# Patient Record
Sex: Female | Born: 1945 | Race: White | Hispanic: No | Smoking: Former smoker
Health system: Southern US, Community
[De-identification: ages and names within clinical notes are randomized; demographics above are authoritative.]

## PROBLEM LIST (undated history)

## (undated) DIAGNOSIS — I10 Essential (primary) hypertension: Secondary | ICD-10-CM

## (undated) DIAGNOSIS — K635 Polyp of colon: Secondary | ICD-10-CM

## (undated) DIAGNOSIS — K219 Gastro-esophageal reflux disease without esophagitis: Secondary | ICD-10-CM

## (undated) DIAGNOSIS — E785 Hyperlipidemia, unspecified: Secondary | ICD-10-CM

## (undated) DIAGNOSIS — F430 Acute stress reaction: Secondary | ICD-10-CM

## (undated) DIAGNOSIS — R0600 Dyspnea, unspecified: Secondary | ICD-10-CM

## (undated) DIAGNOSIS — E669 Obesity, unspecified: Secondary | ICD-10-CM

## (undated) DIAGNOSIS — J189 Pneumonia, unspecified organism: Secondary | ICD-10-CM

## (undated) HISTORY — DX: Pneumonia, unspecified organism: J18.9

## (undated) HISTORY — DX: Essential (primary) hypertension: I10

## (undated) HISTORY — DX: Gastro-esophageal reflux disease without esophagitis: K21.9

## (undated) HISTORY — DX: Obesity, unspecified: E66.9

## (undated) HISTORY — DX: Polyp of colon: K63.5

## (undated) HISTORY — DX: Hyperlipidemia, unspecified: E78.5

## (undated) HISTORY — DX: Dyspnea, unspecified: R06.00

## (undated) HISTORY — DX: Acute stress reaction: F43.0

---

## 1973-10-27 HISTORY — PX: OTHER SURGICAL HISTORY: SHX169

## 2006-11-13 ENCOUNTER — Emergency Department: Payer: Self-pay | Admitting: Emergency Medicine

## 2007-02-04 ENCOUNTER — Ambulatory Visit: Payer: Self-pay | Admitting: Family Medicine

## 2007-02-04 LAB — CONVERTED CEMR LAB
ALT: 21 units/L (ref 0–40)
AST: 23 units/L (ref 0–37)
Alkaline Phosphatase: 70 units/L (ref 39–117)
BUN: 11 mg/dL (ref 6–23)
Basophils Relative: 0.5 % (ref 0.0–1.0)
CO2: 31 meq/L (ref 19–32)
Calcium: 9.3 mg/dL (ref 8.4–10.5)
Chloride: 108 meq/L (ref 96–112)
Cholesterol: 237 mg/dL (ref 0–200)
Creatinine, Ser: 0.8 mg/dL (ref 0.4–1.2)
Hemoglobin: 14.6 g/dL (ref 12.0–15.0)
Monocytes Absolute: 0.7 10*3/uL (ref 0.2–0.7)
Monocytes Relative: 8.1 % (ref 3.0–11.0)
Potassium: 4.6 meq/L (ref 3.5–5.1)
RDW: 13.6 % (ref 11.5–14.6)
Total Bilirubin: 0.7 mg/dL (ref 0.3–1.2)
Total CHOL/HDL Ratio: 5.5
Total Protein: 7.3 g/dL (ref 6.0–8.3)
VLDL: 31 mg/dL (ref 0–40)

## 2007-02-10 ENCOUNTER — Ambulatory Visit: Payer: Self-pay | Admitting: Family Medicine

## 2007-02-18 ENCOUNTER — Ambulatory Visit: Payer: Self-pay | Admitting: Gastroenterology

## 2007-03-01 ENCOUNTER — Encounter: Payer: Self-pay | Admitting: Family Medicine

## 2007-03-01 DIAGNOSIS — I10 Essential (primary) hypertension: Secondary | ICD-10-CM

## 2007-03-01 DIAGNOSIS — E785 Hyperlipidemia, unspecified: Secondary | ICD-10-CM | POA: Insufficient documentation

## 2007-03-01 DIAGNOSIS — K5120 Ulcerative (chronic) proctitis without complications: Secondary | ICD-10-CM | POA: Insufficient documentation

## 2007-03-01 DIAGNOSIS — K219 Gastro-esophageal reflux disease without esophagitis: Secondary | ICD-10-CM | POA: Insufficient documentation

## 2007-03-01 DIAGNOSIS — Z87891 Personal history of nicotine dependence: Secondary | ICD-10-CM

## 2007-03-04 ENCOUNTER — Encounter: Payer: Self-pay | Admitting: Family Medicine

## 2007-03-04 ENCOUNTER — Encounter: Payer: Self-pay | Admitting: Gastroenterology

## 2007-03-04 ENCOUNTER — Ambulatory Visit: Payer: Self-pay | Admitting: Gastroenterology

## 2007-03-04 DIAGNOSIS — D126 Benign neoplasm of colon, unspecified: Secondary | ICD-10-CM | POA: Insufficient documentation

## 2007-03-04 DIAGNOSIS — K635 Polyp of colon: Secondary | ICD-10-CM

## 2007-03-04 HISTORY — DX: Polyp of colon: K63.5

## 2007-03-12 ENCOUNTER — Ambulatory Visit: Payer: Self-pay | Admitting: Family Medicine

## 2007-03-12 DIAGNOSIS — R0989 Other specified symptoms and signs involving the circulatory and respiratory systems: Secondary | ICD-10-CM

## 2007-03-12 DIAGNOSIS — R0609 Other forms of dyspnea: Secondary | ICD-10-CM | POA: Insufficient documentation

## 2007-03-16 ENCOUNTER — Ambulatory Visit: Payer: Self-pay | Admitting: Gastroenterology

## 2007-03-25 ENCOUNTER — Ambulatory Visit: Payer: Self-pay | Admitting: Internal Medicine

## 2007-03-29 ENCOUNTER — Ambulatory Visit: Payer: Self-pay | Admitting: Gastroenterology

## 2007-04-12 ENCOUNTER — Ambulatory Visit: Admission: RE | Admit: 2007-04-12 | Discharge: 2007-04-12 | Payer: Self-pay | Admitting: Internal Medicine

## 2007-04-12 ENCOUNTER — Ambulatory Visit: Payer: Self-pay

## 2007-04-12 ENCOUNTER — Encounter: Payer: Self-pay | Admitting: Family Medicine

## 2007-04-14 ENCOUNTER — Encounter (INDEPENDENT_AMBULATORY_CARE_PROVIDER_SITE_OTHER): Payer: Self-pay | Admitting: *Deleted

## 2007-05-05 ENCOUNTER — Ambulatory Visit: Payer: Self-pay | Admitting: Internal Medicine

## 2007-05-07 ENCOUNTER — Ambulatory Visit: Payer: Self-pay

## 2007-05-17 ENCOUNTER — Ambulatory Visit: Payer: Self-pay | Admitting: Internal Medicine

## 2008-01-31 ENCOUNTER — Ambulatory Visit: Payer: Self-pay | Admitting: Family Medicine

## 2008-01-31 DIAGNOSIS — E669 Obesity, unspecified: Secondary | ICD-10-CM | POA: Insufficient documentation

## 2008-02-01 LAB — CONVERTED CEMR LAB
Amylase: 68 units/L (ref 27–131)
Bilirubin, Direct: 0.1 mg/dL (ref 0.0–0.3)
Eosinophils Absolute: 0.2 10*3/uL (ref 0.0–0.7)
H Pylori IgG: NEGATIVE
HCT: 43.4 % (ref 36.0–46.0)
Lipase: 31 units/L (ref 11.0–59.0)
MCHC: 32.9 g/dL (ref 30.0–36.0)
MCV: 93.5 fL (ref 78.0–100.0)
Monocytes Absolute: 0.8 10*3/uL (ref 0.1–1.0)
Neutro Abs: 6.1 10*3/uL (ref 1.4–7.7)
Platelets: 361 10*3/uL (ref 150–400)
RDW: 13.2 % (ref 11.5–14.6)
Total Bilirubin: 0.9 mg/dL (ref 0.3–1.2)
Total Protein: 7.5 g/dL (ref 6.0–8.3)

## 2008-02-02 ENCOUNTER — Encounter: Payer: Self-pay | Admitting: Family Medicine

## 2008-02-16 ENCOUNTER — Ambulatory Visit: Payer: Self-pay | Admitting: Family Medicine

## 2008-02-16 DIAGNOSIS — R4589 Other symptoms and signs involving emotional state: Secondary | ICD-10-CM

## 2008-02-18 ENCOUNTER — Ambulatory Visit: Payer: Self-pay | Admitting: Gastroenterology

## 2008-11-10 ENCOUNTER — Ambulatory Visit: Payer: Self-pay | Admitting: Family Medicine

## 2008-11-10 DIAGNOSIS — R609 Edema, unspecified: Secondary | ICD-10-CM | POA: Insufficient documentation

## 2008-11-24 ENCOUNTER — Ambulatory Visit: Payer: Self-pay | Admitting: Family Medicine

## 2008-12-05 LAB — CONVERTED CEMR LAB
AST: 21 units/L (ref 0–37)
Albumin: 3.7 g/dL (ref 3.5–5.2)
BUN: 23 mg/dL (ref 6–23)
Calcium: 9.2 mg/dL (ref 8.4–10.5)
Cholesterol: 248 mg/dL (ref 0–200)
Creatinine, Ser: 1.1 mg/dL (ref 0.4–1.2)
Eosinophils Absolute: 0.3 10*3/uL (ref 0.0–0.7)
Eosinophils Relative: 2.9 % (ref 0.0–5.0)
Glucose, Bld: 93 mg/dL (ref 70–99)
HCT: 39.3 % (ref 36.0–46.0)
Hemoglobin: 14.1 g/dL (ref 12.0–15.0)
MCV: 88.4 fL (ref 78.0–100.0)
Monocytes Absolute: 0.9 10*3/uL (ref 0.1–1.0)
Monocytes Relative: 9.4 % (ref 3.0–12.0)
Neutro Abs: 5.6 10*3/uL (ref 1.4–7.7)
Phosphorus: 4.3 mg/dL (ref 2.3–4.6)
Platelets: 333 10*3/uL (ref 150–400)
Potassium: 4.3 meq/L (ref 3.5–5.1)
TSH: 3.81 microintl units/mL (ref 0.35–5.50)
Triglycerides: 113 mg/dL (ref 0–149)
WBC: 9.5 10*3/uL (ref 4.5–10.5)

## 2008-12-07 ENCOUNTER — Encounter: Payer: Self-pay | Admitting: Family Medicine

## 2008-12-21 ENCOUNTER — Ambulatory Visit: Payer: Self-pay | Admitting: Family Medicine

## 2009-01-27 ENCOUNTER — Ambulatory Visit: Payer: Self-pay | Admitting: Family Medicine

## 2009-01-27 ENCOUNTER — Telehealth: Payer: Self-pay | Admitting: Family Medicine

## 2009-02-01 ENCOUNTER — Encounter: Payer: Self-pay | Admitting: Family Medicine

## 2009-02-02 ENCOUNTER — Encounter: Payer: Self-pay | Admitting: Family Medicine

## 2009-06-22 ENCOUNTER — Ambulatory Visit: Payer: Self-pay | Admitting: Family Medicine

## 2009-06-25 LAB — CONVERTED CEMR LAB
Albumin: 3.5 g/dL (ref 3.5–5.2)
BUN: 20 mg/dL (ref 6–23)
Calcium: 9 mg/dL (ref 8.4–10.5)
Cholesterol: 168 mg/dL (ref 0–200)
Creatinine, Ser: 1.1 mg/dL (ref 0.4–1.2)
Glucose, Bld: 90 mg/dL (ref 70–99)
HDL: 37.5 mg/dL — ABNORMAL LOW (ref >39.00)
LDL Cholesterol: 100 mg/dL — ABNORMAL HIGH (ref 0–99)
Phosphorus: 3 mg/dL (ref 2.3–4.6)
Triglycerides: 151 mg/dL — ABNORMAL HIGH (ref 0.0–149.0)
VLDL: 30.2 mg/dL (ref 0.0–40.0)

## 2009-07-16 ENCOUNTER — Telehealth: Payer: Self-pay | Admitting: Gastroenterology

## 2009-09-11 ENCOUNTER — Ambulatory Visit: Payer: Self-pay | Admitting: Family Medicine

## 2009-09-19 ENCOUNTER — Telehealth: Payer: Self-pay | Admitting: Gastroenterology

## 2009-09-24 ENCOUNTER — Ambulatory Visit: Payer: Self-pay | Admitting: Family Medicine

## 2009-10-09 ENCOUNTER — Ambulatory Visit: Payer: Self-pay | Admitting: Family Medicine

## 2009-10-18 ENCOUNTER — Observation Stay: Payer: Self-pay | Admitting: Student

## 2009-10-18 ENCOUNTER — Encounter: Payer: Self-pay | Admitting: Family Medicine

## 2009-10-18 ENCOUNTER — Ambulatory Visit: Payer: Self-pay | Admitting: Internal Medicine

## 2009-10-24 ENCOUNTER — Ambulatory Visit: Payer: Self-pay | Admitting: Gastroenterology

## 2009-10-30 ENCOUNTER — Ambulatory Visit: Payer: Self-pay | Admitting: Internal Medicine

## 2009-10-31 ENCOUNTER — Ambulatory Visit: Payer: Self-pay | Admitting: Family Medicine

## 2009-11-01 ENCOUNTER — Ambulatory Visit: Payer: Self-pay | Admitting: Gastroenterology

## 2009-11-01 LAB — HM COLONOSCOPY

## 2009-11-06 ENCOUNTER — Encounter: Payer: Self-pay | Admitting: Gastroenterology

## 2009-11-07 ENCOUNTER — Encounter: Payer: Self-pay | Admitting: Gastroenterology

## 2009-11-09 ENCOUNTER — Encounter (INDEPENDENT_AMBULATORY_CARE_PROVIDER_SITE_OTHER): Payer: Self-pay

## 2009-11-13 ENCOUNTER — Ambulatory Visit: Payer: Self-pay | Admitting: Gastroenterology

## 2009-12-05 ENCOUNTER — Encounter (INDEPENDENT_AMBULATORY_CARE_PROVIDER_SITE_OTHER): Payer: Self-pay | Admitting: *Deleted

## 2009-12-24 ENCOUNTER — Ambulatory Visit: Payer: Self-pay | Admitting: Family Medicine

## 2009-12-26 LAB — CONVERTED CEMR LAB
AST: 19 units/L (ref 0–37)
BUN: 16 mg/dL (ref 6–23)
CO2: 30 meq/L (ref 19–32)
Cholesterol: 178 mg/dL (ref 0–200)
Creatinine, Ser: 0.9 mg/dL (ref 0.4–1.2)
GFR calc non Af Amer: 66.92 mL/min (ref >60)
Glucose, Bld: 91 mg/dL (ref 70–99)
LDL Cholesterol: 100 mg/dL — ABNORMAL HIGH (ref 0–99)
Sodium: 141 meq/L (ref 135–145)
Total CHOL/HDL Ratio: 4
VLDL: 28.8 mg/dL (ref 0.0–40.0)

## 2010-01-01 ENCOUNTER — Telehealth: Payer: Self-pay | Admitting: Gastroenterology

## 2010-01-08 ENCOUNTER — Ambulatory Visit: Payer: Self-pay | Admitting: Gastroenterology

## 2010-01-08 DIAGNOSIS — Z860100 Personal history of colon polyps, unspecified: Secondary | ICD-10-CM | POA: Insufficient documentation

## 2010-01-08 DIAGNOSIS — E538 Deficiency of other specified B group vitamins: Secondary | ICD-10-CM

## 2010-02-12 ENCOUNTER — Ambulatory Visit: Payer: Self-pay | Admitting: Internal Medicine

## 2010-06-27 ENCOUNTER — Ambulatory Visit: Payer: Self-pay | Admitting: Family Medicine

## 2010-07-01 ENCOUNTER — Encounter: Payer: Self-pay | Admitting: Family Medicine

## 2010-07-01 ENCOUNTER — Emergency Department: Payer: Self-pay | Admitting: Emergency Medicine

## 2010-07-02 ENCOUNTER — Ambulatory Visit: Payer: Self-pay | Admitting: Family Medicine

## 2010-07-09 ENCOUNTER — Ambulatory Visit: Payer: Self-pay | Admitting: Family Medicine

## 2010-07-09 DIAGNOSIS — Z8719 Personal history of other diseases of the digestive system: Secondary | ICD-10-CM

## 2010-07-09 DIAGNOSIS — M79605 Pain in left leg: Secondary | ICD-10-CM

## 2010-07-09 DIAGNOSIS — M545 Low back pain: Secondary | ICD-10-CM

## 2010-07-09 DIAGNOSIS — K5732 Diverticulitis of large intestine without perforation or abscess without bleeding: Secondary | ICD-10-CM

## 2010-07-24 ENCOUNTER — Ambulatory Visit: Payer: Self-pay | Admitting: Family Medicine

## 2010-07-25 LAB — CONVERTED CEMR LAB
ALT: 21 units/L (ref 0–35)
ALT: 26 units/L (ref 0–35)
AST: 21 units/L (ref 0–37)
Albumin: 3.9 g/dL (ref 3.5–5.2)
Alkaline Phosphatase: 66 units/L (ref 39–117)
BUN: 16 mg/dL (ref 6–23)
BUN: 22 mg/dL (ref 6–23)
Basophils Relative: 0.6 % (ref 0.0–3.0)
Bilirubin, Direct: 0.1 mg/dL (ref 0.0–0.3)
Bilirubin, Direct: 0.1 mg/dL (ref 0.0–0.3)
CO2: 30 meq/L (ref 19–32)
Calcium: 9.3 mg/dL (ref 8.4–10.5)
Calcium: 9.6 mg/dL (ref 8.4–10.5)
Chloride: 99 meq/L (ref 96–112)
Chloride: 100 meq/L (ref 96–112)
Cholesterol: 184 mg/dL (ref 0–200)
Creatinine, Ser: 1 mg/dL (ref 0.4–1.2)
Eosinophils Relative: 4.3 % (ref 0.0–5.0)
Eosinophils Relative: 3.8 % (ref 0.0–5.0)
GFR calc non Af Amer: 56.53 mL/min (ref >60)
Hemoglobin: 13.6 g/dL (ref 12.0–15.0)
LDL Cholesterol: 111 mg/dL — ABNORMAL HIGH (ref 0–99)
Lipase: 41 units/L (ref 11.0–59.0)
Lymphocytes Relative: 33.1 % (ref 12.0–46.0)
Lymphocytes Relative: 24.5 % (ref 12.0–46.0)
MCV: 92.9 fL (ref 78.0–100.0)
Monocytes Absolute: 0.8 10*3/uL (ref 0.1–1.0)
Neutro Abs: 4.2 10*3/uL (ref 1.4–7.7)
Neutrophils Relative %: 54.3 % (ref 43.0–77.0)
Neutrophils Relative %: 62.4 % (ref 43.0–77.0)
Platelets: 364 10*3/uL (ref 150.0–400.0)
Potassium: 4 meq/L (ref 3.5–5.1)
RBC: 4.37 M/uL (ref 3.87–5.11)
Total Bilirubin: 0.7 mg/dL (ref 0.3–1.2)
Total Protein: 6.9 g/dL (ref 6.0–8.3)
VLDL: 34.6 mg/dL (ref 0.0–40.0)
WBC: 7.7 10*3/uL (ref 4.5–10.5)
WBC: 9.3 10*3/uL (ref 4.5–10.5)

## 2010-08-07 ENCOUNTER — Encounter: Payer: Self-pay | Admitting: Family Medicine

## 2010-08-29 ENCOUNTER — Telehealth: Payer: Self-pay | Admitting: Gastroenterology

## 2010-08-30 ENCOUNTER — Telehealth: Payer: Self-pay | Admitting: Gastroenterology

## 2010-09-06 ENCOUNTER — Telehealth: Payer: Self-pay | Admitting: Gastroenterology

## 2010-11-13 ENCOUNTER — Encounter: Payer: Self-pay | Admitting: Gastroenterology

## 2010-11-26 NOTE — Progress Notes (Signed)
Summary: triage   Phone Note Call from Patient Call back at Home Phone [phone]   Caller: Patient Call For: Courtney Roth Reason for Call: Talk to Nurse Summary of Call: Patient wants to be seen sooner than April 5 (first available) do to rectal bleeding since col done Jan 6 Initial call taken by: Ronalee Red,  January 01, 2010 10:46 AM  Follow-up for Phone Call        Given appt. for 01/08/10. Follow-up by: Abel Presto RN,  January 01, 2010 11:29 AM

## 2010-11-26 NOTE — Procedures (Signed)
Summary: Colonoscopy  Patient: Courtney Roth Note: All result statuses are Final unless otherwise noted.  Tests: (1) Colonoscopy (COL)   COL Colonoscopy           [facility].     [place], [place]  [address]           COLONOSCOPY PROCEDURE REPORT           PATIENT:  Courtney Roth, Courtney Roth  MR#:  [id number]     BIRTHDATE:  [birth year], 63 yrs. old  GENDER:  female           ENDOSCOPIST:  Sandy Salaam. Deatra Ina, MD     Referred by:           PROCEDURE DATE:  11/01/2009     PROCEDURE:  Colonoscopy with polypectomy and submucosal injection,     Colonoscopy with biopsy     ASA CLASS:  Class II     INDICATIONS:  evaluation of ulcerative colitis           MEDICATIONS:   Fentanyl 50 mcg IV, Versed 7 mg IV, Benadryl 25 mg     IV           DESCRIPTION OF PROCEDURE:   After the risks benefits and     alternatives of the procedure were thoroughly explained, informed     consent was obtained.  Digital rectal exam was performed and     revealed no abnormalities.   The LB [id number]-[id number] endoscope     was introduced through the anus and advanced to the cecum, which     was identified by the ileocecal valve, without limitations.  The     quality of the prep was excellent, using MoviPrep.  The instrument     was then slowly withdrawn as the colon was fully examined.     <<PROCEDUREIMAGES>>           FINDINGS:  A sessile polyp was found in the ascending colon. It     was 18 mm in size. 8cc NS injected submucosally (see image5).     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 2cm cuff of edematous and     erythematous mucosa just inside the anal vault. Bxs taken (see     image14, image15, and image16).  Internal hemorrhoids were found     (see image15).  This was otherwise a normal examination of the     colon (see image1, image2, image7, image9, image11, and image12).     Random biopsies were taken  every 10cm to r/o dysplasia     Retroflexed views in the rectum revealed no abnormalities.    The     scope was then withdrawn from the patient and the procedure     completed.           COMPLICATIONS:  None           ENDOSCOPIC IMPRESSION:     1) 18 mm sessile polyp in the ascending colon     2) Proctitis     3) Internal hemorrhoids     4) Otherwise normal examination     RECOMMENDATIONS:     1) Decrease Asacol  800 mg po tid     2) colonoscopy 1 year           REPEAT EXAM:  In 1 year(s) for Colonoscopy.  ______________________________     Sandy Salaam Deatra Ina, MD           CC:  Abner Greenspan, MD           n.     Lorrin MaisSandy Salaam. Kynzlie Hilleary at 11/01/2009 04:10 PM           Page 2 of 3   Courtney Roth, Courtney Roth, [id number]  Note: An exclamation mark (!) indicates a result that was not dispersed into the flowsheet. Document Creation Date: 11/01/2009 4:10 PM _______________________________________________________________________  (1) Order result status: Final Collection or observation date-time: 11/01/2009 15:59 Requested date-time:  Receipt date-time:  Reported date-time:  Referring Physician:   Ordering Physician: Erskine Emery [phone]) Specimen Source:  Source: Tawanna Cooler Order Number: [id number] Lab site:   Appended Document: Colonoscopy     Procedures Next Due Date:    Colonoscopy: 10/2010

## 2010-11-26 NOTE — Progress Notes (Signed)
Summary: Triage  Medications Added LIALDA 1.2 GM TBEC (MESALAMINE) 2.4 gm by mouth once daily PROCTOFOAM 1 % FOAM (PRAMOXINE HCL) use nightly for 14 days       Phone Note Call from Patient Call back at Home Phone [phone]   Caller: Patient Call For: Dr. Deatra Ina Reason for Call: Talk to Nurse Summary of Call: rectal bleeding everytime she has a BM x3weeks Initial call taken by: Webb Laws,  August 29, 2010 9:48 AM  Follow-up for Phone Call        Left message for patient to call back [place], [facility]  August 29, 2010 9:53 AM  Patient  returned my call.  She has 3 weeks of rectal bleeding, mucus in stool.  She has more frequent stools that are semisolid, but not diarrhea. Currently she is on Asacol 400 mg 2 BID.    She went to her pharmacy yesterday and her Asacol 400 were $300.  She has Asacol 800 and Lialda also on her formulary.  Please advise if ok to call the 800 mg or recommend a rx change due to cost.  According to the office note from 12/2009 patient is due for a colon in January, she is wondering if she can schedule now before the end of the year.  Please advise on rectal bleeding, ? rx change, and if ok to schedule colon early.   Follow-up by: Barb Merino RN, CGRN,  August 29, 2010 12:03 PM  Additional Follow-up for Phone Call Additional follow up Details #1::        If cost effective, switch to lialda 2.4gm once daily Add proctofoam 1 hs x 14 days ok to schedule colo prior to end of year Additional Follow-up by: Inda Castle MD,  August 30, 2010 10:04 AM    Additional Follow-up for Phone Call Additional follow up Details #2::    Patient  advised of Dr Kelby Fam recommendations.  She wants to try the Lialda and Proctofoam.  She will call back to schedule the colon.  I have mailed her a coupon for Lialda.   Follow-up by: Barb Merino RN, CGRN,  August 30, 2010 10:18 AM  New/Updated Medications: LIALDA 1.2 GM TBEC (MESALAMINE) 2.4 gm by mouth once  daily PROCTOFOAM 1 % FOAM (PRAMOXINE HCL) use nightly for 14 days Prescriptions: PROCTOFOAM 1 % FOAM (PRAMOXINE HCL) use nightly for 14 days  #14 day suppl x 1   Entered by:   Barb Merino RN, CGRN   Authorized by:   Inda Castle MD   Signed by:   Barb Merino RN, CGRN on 08/30/2010   Method used:   Electronically to        [facility]. [phone]* (retail)       [address]       [place], [place]  [address]       Ph: [phone]       Fax: [phone]   RxID:   [id number] LIALDA 1.2 GM TBEC (MESALAMINE) 2.4 gm by mouth once daily  #60 x 6   Entered by:   Barb Merino RN, CGRN   Authorized by:   Inda Castle MD   Signed by:   Barb Merino RN, CGRN on 08/30/2010   Method used:   Electronically to        [facility]. [phone]* (retail)  [place], [place]  [address]       Ph: [phone]       Fax: [phone]   RxID:   [id number]

## 2010-11-26 NOTE — Assessment & Plan Note (Signed)
Summary: F/U [facility]  D/C 10/18/09/[facility]   Vital Signs:  Patient profile:   65 year old female Temp:     97.9 degrees F oral Pulse rate:   76 / minute Pulse rhythm:   regular BP sitting:   112 / 70  (left arm) Cuff size:   regular  Vitals Entered By: Marty Heck CMA (October 31, 2009 11:43 AM) CC: Hospital follow up   History of Present Illness: was in hospital with chest pain- atypical nl stress test in past -- planning lexiscan next (pt thinks she may cancel it -- does not think it is her heart)  had nl gb ultrasound in hosp all neg cardiac enzymes and nl EKG  pt has upcoming f/u Dr Deatra Ina for likely GERd  still has chest pain-- feels like she swallowed a bunch of ice  will see Dr Deatra Ina to check out her esophagus  is overall fluctuating -- from mild to worse  took 2 prilosecs for a week and then go down to 1  she went backto 2 again-- since symptoms worsened   when she swallows feels like phlegm in her throat - and food comes back up  no choking or food getting stuck   Allergies: 1)  ! * Toprol Xl 2)  ! Codeine  Past History:  Past Medical History: Last updated: 10/30/2009 GERD Hyperlipidemia Hypertension obesity  former smoker ulcerative colitis  stress reaction Dyspnea   --ech and Myvoiew normal 2008  Past Surgical History: Last updated: 03/01/2007 GYN surgery- ruptured tubal pregnancy, one ovary removed (1975) Holter monitor- palpitations Pneumonia- outpatient (10/2006)  Family History: Last updated: 01/31/2008 Father: Heart problem Mother: Died 2004. Heart problem, thyroid growth, OP, HTN, GERD  Siblings: One sister with Crohn's disease  Social History: Last updated: 01/31/2008 Marital Status: Married Children: 1 son Occupation: retired from office Former Smoker- quit 1 /08  Risk Factors: Smoking Status: quit (03/01/2007)  Review of Systems General:  Denies fatigue, fever, loss of appetite, malaise, and sleep disorder. Eyes:  Denies  blurring and eye irritation. CV:  Complains of chest pain or discomfort; denies lightheadness, palpitations, and shortness of breath with exertion. Resp:  Denies chest pain with inspiration, cough, morning headaches, pleuritic, and shortness of breath. GI:  Complains of indigestion; denies abdominal pain and nausea. GU:  Denies dysuria and hematuria. Derm:  Denies lesion(s), poor wound healing, and rash. Neuro:  Denies numbness and tingling. Endo:  Denies excessive thirst and excessive urination.  Physical Exam  General:  overweight but generally well appearing  Head:  normocephalic, atraumatic, and no abnormalities observed.    Eyes:  vision grossly intact, pupils equal, pupils round, pupils reactive to light, and no injection.   Mouth:  pharynx pink and moist.   Neck:  supple with full rom and no masses or thyromegally, no JVD or carotid bruit  Lungs:  CTA with diffusely distant bs and no wheeze or crackles  Heart:  Normal rate and regular rhythm. S1 and S2 normal without gallop, murmur, click, rub or other extra sounds. Abdomen:  Bowel sounds positive,abdomen soft and non-tender without masses, organomegaly or hernias noted. no renal bruits  does belch frequently  Msk:  No deformity or scoliosis noted of thoracic or lumbar spine.   Pulses:  R and L carotid,radial,femoral,dorsalis pedis and posterior tibial pulses are full and equal bilaterally Extremities:  No clubbing, cyanosis, edema, or deformity noted with normal full range of motion of all joints.   no tenderness or palp cords  Neurologic:  sensation intact to light touch, gait normal, and DTRs symmetrical and normal.   Skin:  Intact without suspicious lesions or rashes Cervical Nodes:  No lymphadenopathy noted Psych:  nl affect   Impression & Recommendations:  Problem # 1:  CHEST TIGHTNESS-PRESSURE-OTHER ([id number]) Assessment Unchanged atypical pain is likely GI in origin-- with poss esophagitis/ reflux or stricture rev  hosp records in detail today with pt  I adv to continue 2 prilosec daily and f/u GI as planned  rev lifestyle change- wt loss/ avoid tab and alcohol/ caffine/ spicy foods disc imp of not eating within 2 hours of bed   Problem # 2:  OBESITY (ICD-278.00) Assessment: Comment Only disc imp of wt loss in light of gerd and other health probs- also for cardiovasc risk factors/health  Problem # 3:  HYPERTENSION (ICD-401.9) Assessment: Unchanged bp very well controlled on current regemen- wil continue to follow Her updated medication list for this problem includes:    Zestril 5 Mg Tabs (Lisinopril) .Marland Kitchen... 1 by mouth once daily    Hydrochlorothiazide 25 Mg Tabs (Hydrochlorothiazide) .Marland Kitchen... 1 by mouth each am  BP today: 112/70 Prior BP: 150/80 (10/30/2009)  Labs Reviewed: K+: 4.1 (06/22/2009) Creat: : 1.1 (06/22/2009)   Chol: 168 (06/22/2009)   HDL: 37.50 (06/22/2009)   LDL: 100 (06/22/2009)   TG: 151.0 (06/22/2009)  Complete Medication List: 1)  Asacol 400 Mg Tbec (Mesalamine) .... 3 by mouth three times a day 2)  Zestril 5 Mg Tabs (Lisinopril) .Marland Kitchen.. 1 by mouth once daily 3)  Adult Aspirin Low Strength 81 Mg Tbdp (Aspirin) .... Take 1 tablet by mouth once a day 4)  Fish Oil 1000 Mg Caps (Omega-3 fatty acids) .... Take 1 capsule by mouth three times a day 5)  Vitamin D3 1000 Unit Tabs (Cholecalciferol) .... Take 1 tablet by mouth three times a day 6)  Hydrochlorothiazide 25 Mg Tabs (Hydrochlorothiazide) .Marland Kitchen.. 1 by mouth each am 7)  Zocor 20 Mg Tabs (Simvastatin) .... Take 1 tablet by mouth once a day 8)  Flonase 50 Mcg/act Susp (Fluticasone propionate) .... 2 sprays in each nostril once daily 9)  Prilosec 20 Mg Cpdr (Omeprazole) .... 2 by mouth once daily  Patient Instructions: 1)  continue your prilosec 2 pills daily  2)  watch diet carefully for caffiene, spicy foods , acidic foods and beverages  3)  try not to eat within 2 hours of bedtime 4)  follow up with Dr Deatra Ina as planned   Prior  Medications (reviewed today): ASACOL 400 MG  TBEC (MESALAMINE) 3 by mouth three times a day ZESTRIL 5 MG TABS (LISINOPRIL) 1 by mouth once daily ADULT ASPIRIN LOW STRENGTH 81 MG TBDP (ASPIRIN) Take 1 tablet by mouth once a day FISH OIL 1000 MG CAPS (OMEGA-3 FATTY ACIDS) Take 1 capsule by mouth three times a day VITAMIN D3 1000 UNIT TABS (CHOLECALCIFEROL) Take 1 tablet by mouth three times a day HYDROCHLOROTHIAZIDE 25 MG TABS (HYDROCHLOROTHIAZIDE) 1 by mouth each am ZOCOR 20 MG TABS (SIMVASTATIN) Take 1 tablet by mouth once a day FLONASE 50 MCG/ACT SUSP (FLUTICASONE PROPIONATE) 2 sprays in each nostril once daily Current Allergies: ! * TOPROL XL ! CODEINE

## 2010-11-26 NOTE — Assessment & Plan Note (Signed)
Summary: ROA FOR 2-4 WEEL FOLLOW-UP/JRR   Vital Signs:  Patient profile:   65 year old female Height:      59.25 inches Weight:      215.50 pounds BMI:     43.32 Temp:     98.9 degrees F oral Pulse rate:   120 / minute Pulse rhythm:   regular BP sitting:   104 / 72  (left arm) Cuff size:   large  Vitals Entered By: Ozzie Hoyle LPN (July 24, 4966 11:26 AM) CC: 2-4 week f/u   History of Present Illness: here to f/u after last visit with back pain and also pancreatitis  was ref to ortho-- oct 12 will see ortho still painful but better than it was  given flexeril -- that does help when she needs it -- helps her get up and down a lot easier  still needs to hold on to something when she walks  one day pain shot down in her left leg -- that was only time  no weakness or numbness in her legs     never got hosp records   wt is down 14 lb-- some from being sick  is gradually eating more now  GI system is totally better - no problems  no nausea / no vomiting or diarrhea or constipation   had a nasty tougue for a while  probably from antibiotics  also had bumps on back of toungue  ? had thrush - is better now   still do not have records from hospital       Allergies: 1)  ! * Toprol Xl 2)  ! Codeine  Past History:  Past Medical History: Last updated: 10/30/2009 GERD Hyperlipidemia Hypertension obesity  former smoker ulcerative colitis  stress reaction Dyspnea   --ech and Myvoiew normal 2008  Past Surgical History: Last updated: 03/01/2007 GYN surgery- ruptured tubal pregnancy, one ovary removed (1975) Holter monitor- palpitations Pneumonia- outpatient (10/2006)  Family History: Last updated: 01/31/2008 Father: Heart problem Mother: Died 2004. 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 Eyes:  Denies blurring and eye irritation. CV:  Denies chest pain or discomfort and lightheadness. Resp:  Denies cough and wheezing. GI:  Denies abdominal pain, bloody stools, change in bowel habits, indigestion, nausea, and vomiting. MS:  Complains of low back pain; denies joint pain, joint redness, and joint swelling. Derm:  Denies itching, lesion(s), poor wound healing, and rash. Endo:  Denies cold intolerance and heat intolerance. Heme:  Denies abnormal bruising and bleeding.  Physical Exam  General:  overweight but generally well appearing  Head:  normocephalic, atraumatic, and no abnormalities observed.   Eyes:  vision grossly intact, pupils equal, pupils round, and pupils reactive to light.  no conjunctival pallor, injection or icterus  Mouth:  pharynx pink and moist.   Neck:  supple with full rom and no masses or thyromegally, no JVD or carotid bruit  Chest Wall:  No deformities, masses, or tenderness noted. Lungs:  Normal respiratory effort, chest expands symmetrically. Lungs are clear to auscultation, no crackles or wheezes. Heart:  Normal rate and regular rhythm. S1 and S2 normal without gallop, murmur, click, rub or other extra sounds. Abdomen:  Bowel sounds  positive,abdomen soft and non-tender without masses, organomegaly or hernias noted. obese abd  Msk:  moving slowly due to back pain- but overall improved some LS tenderness- low  slr pos for back pain only Extremities:  No clubbing, cyanosis, edema, or deformity noted with normal full range of motion of all joints.   Neurologic:  strength normal in all extremities, sensation intact to light touch, gait normal, and DTRs symmetrical and normal.   Skin:  Intact without suspicious lesions or rashes Cervical Nodes:  No lymphadenopathy noted Inguinal  Nodes:  No significant adenopathy Psych:  normal affect, talkative and pleasant    Impression & Recommendations:  Problem # 1:  PANCREATITIS (ICD-577.0) Assessment Improved pt no longer symptomatic with gradual return of appetite  will send for hosp records and ct again -- if not able to get / pt will pick up  lab today Orders: Venipuncture (96759) TLB-BMP (Basic Metabolic Panel-BMET) (16384-[id number]) TLB-CBC Platelet - w/Differential (85025-CBCD) TLB-Hepatic/Liver Function Pnl (80076-HEPATIC) TLB-Amylase (82150-AMYL) TLB-Lipase (83690-LIPASE)  Problem # 2:  DIVERTICULITIS OF COLON (ICD-562.11) Assessment: Improved improved after abx treatment  sent once again for records lab today pt had colonosc within the year as well- hx of ulcerative colitis / and polyp (next planned 1 year)- followed by Dr Deatra Ina Orders: Venipuncture (70177) TLB-BMP (Basic Metabolic Panel-BMET) (93903-ESPQZRA) TLB-CBC Platelet - w/Differential (85025-CBCD) TLB-Hepatic/Liver Function Pnl (80076-HEPATIC) TLB-Amylase (82150-AMYL) TLB-Lipase (83690-LIPASE)  Problem # 3:  BACK PAIN, LUMBAR (ICD-724.2) Assessment: Improved  this is gradually improved byt not gone continue flexeril as needed with caution  f/u ortho next mo as planned  Her updated medication list for this problem includes:    Adult Aspirin Low Strength 81 Mg Tbdp (Aspirin) .Marland Kitchen... Take 1 tablet by mouth once a day    Flexeril 10 Mg Tabs (Cyclobenzaprine hcl) .Marland Kitchen... 1/2 to 1 by mouth up to three times a day as needed for back pain watch out for sedation  Orders: Prescription Created Electronically [id number])  Complete Medication List: 1)  Asacol 400 Mg Tbec (Mesalamine) .... 3-4 by mouth two  times a day 2)  Zestril 5 Mg Tabs (Lisinopril) .Marland Kitchen.. 1 by mouth once daily 3)  Adult Aspirin Low Strength 81 Mg Tbdp (Aspirin) .... Take 1 tablet by mouth once a day 4)  Fish Oil 1000 Mg Caps (Omega-3 fatty acids) .... Take 1 capsule by mouth three times a  day 5)  Vitamin D3 1000 Unit Tabs (Cholecalciferol) .... Take 1 tablet by mouth three times a day 6)  Hydrochlorothiazide 25 Mg Tabs (Hydrochlorothiazide) .Marland Kitchen.. 1 by mouth each am 7)  Zocor 20 Mg Tabs (Simvastatin) .... Take 1 tablet by mouth once a day 8)  Flonase 50 Mcg/act Susp (Fluticasone propionate) .... 2 sprays in each nostril once daily as needed 9)  Flexeril 10 Mg Tabs (Cyclobenzaprine hcl) .... 1/2 to 1 by mouth up to three times a day as needed for back pain watch out for sedation 10)  Prilosec ?mg  .... Take 1 tablet by mouth once a day  Patient Instructions: 1)  I need to review your [facility] records for several reasons 2)  I need to know why you got pancreatitis (? is there a cause) 3)  I need to know if you really did have diverticulitis 4)  I need for those reasons to review the labs and CT scan 5)  I will do more labs to check on these conditions today 6)  please send for [facility] hosp notes and labs  7)  I'm  so glad you are feeling better  8)  follow up with ortho as planned  Prescriptions: FLEXERIL 10 MG TABS (CYCLOBENZAPRINE HCL) 1/2 to 1 by mouth up to three times a day as needed for back pain watch out for sedation  #30 x 0   Entered and Authorized by:   Allena Earing MD   Signed by:   Allena Earing MD on 07/24/2010   Method used:   Electronically to        [facility] [phone]* (retail)       [address]       [place], [place]  [address]       Ph: [phone]       Fax: [phone]   RxID:   [id number]   Current Allergies (reviewed today): ! * TOPROL XL ! CODEINE

## 2010-11-26 NOTE — Assessment & Plan Note (Signed)
Summary: eph/jss      Allergies Added:   Primary Provider:  Loura Pardon  CC:  post-hospital.  History of Present Illness: Courtney Roth is a very pleasant 65 year old woman with a history of hypertension, hyperlipidemia, morbid obesity, GERD, ulcerative colitis and previous  tobacco use who presents for post-hospital followup.   Recently admitted with atypical CP. Cardiac markers and ECG were normal. Felt to be GI in nature. Had RUQ u/s which was normal.  We saw her in 2008 for dyspnea on exertion and palpitations.  We discussed possible catheterization versus stress testing.  We decided to proceed with stress testing.  She had an exercise Myoview.  She walked for 4 minutes with no significant ST changes.  EF was 83%.  There was no ischemia.  Echocardiogram showed an EF of 55% to 60% with no significant valvular disease.   Still with occasional CP. Mostly when she swallows feels like phelgm gets stuck. No exertional CP. No significant SOB.    Current Medications (verified): 1)  Asacol 400 Mg  Tbec (Mesalamine) .... 3 By Mouth Three Times A Day 2)  Zestril 5 Mg Tabs (Lisinopril) .Marland Kitchen.. 1 By Mouth Once Daily 3)  Adult Aspirin Low Strength 81 Mg Tbdp (Aspirin) .... Take 1 Tablet By Mouth Once A Day 4)  Fish Oil 1000 Mg Caps (Omega-3 Fatty Acids) .... Take 1 Capsule By Mouth Three Times A Day 5)  Vitamin D3 1000 Unit Tabs (Cholecalciferol) .... Take 1 Tablet By Mouth Three Times A Day 6)  Hydrochlorothiazide 25 Mg Tabs (Hydrochlorothiazide) .Marland Kitchen.. 1 By Mouth Each Am 7)  Zocor 20 Mg Tabs (Simvastatin) .... Take 1 Tablet By Mouth Once A Day 8)  Flonase 50 Mcg/act Susp (Fluticasone Propionate) .... 2 Sprays in Each Nostril Once Daily  Allergies (verified): 1)  ! * Toprol Xl 2)  ! Codeine  Past History:  Past Medical History: GERD Hyperlipidemia Hypertension obesity  former smoker ulcerative colitis  stress reaction Dyspnea   --ech and Myvoiew normal 2008  Review of Systems   As per HPI and past medical history; otherwise all systems negative.   Vital Signs:  Patient profile:   65 year old female Height:      59.25 inches Weight:      232 pounds BMI:     46.63 Pulse rate:   101 / minute BP sitting:   150 / 80  (left arm) Cuff size:   large  Vitals Entered By: Mignon Pine, RMA (October 30, 2009 4:10 PM)  Physical Exam  General:  overweight but generally well appearing HEENT: normal Neck: supple. no JVD. Carotids 2+ bilat; no bruits. No lymphadenopathy or thryomegaly appreciated. Cor: PMI nondisplaced. Regular rate & rhythm. No rubs, gallops, murmur. Lungs: clear Abdomen: obese. soft, nontender, nondistended. Good bowel sounds. Extremities: no cyanosis, clubbing, rash, edema Neuro: alert & orientedx3, cranial nerves grossly intact. moves all 4 extremities w/o difficulty. affect pleasant    Impression & Recommendations:  Problem # 1:  CHEST TIGHTNESS-PRESSURE-OTHER ([id number])  I think this is likely refulx but given risk factors will repeat Myoview. Has a hard time walking so will do [employer]. Will have her f/u with Dr. Deatra Ina this week for evaluation of possible reflux.   Orders: Nuclear Stress Test (Nuc Stress Test)  Patient Instructions: 1)  Your physician recommends that you schedule a follow-up appointment in: 6 months 2)  Your physician has requested that you have an [place].  For further information please visit [email or web address].  Please follow instruction sheet, as given.

## 2010-11-26 NOTE — Assessment & Plan Note (Signed)
Summary: rectal bldg 10:30 appt.    History of Present Illness Visit Type: Follow-up Visit Primary GI MD: Erskine Emery MD [facility] Primary Provider: [facility] Chief Complaint: rectal bleeding that has subsided, BRB on tissue and in toilet bowl History of Present Illness:   Courtney Roth has returned following colonoscopy.  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  A sessile serrated adenoma was removed from the right colon.  She had been taking ;asacol  1600 mg twice a day.  This was lower to $1200 twice a day.  Because of  rectal bleeding she increased it again.  Rectal bleeding has significantly diminished.   GI Review of Systems      Denies abdominal pain, acid reflux, belching, bloating, chest pain, dysphagia with liquids, dysphagia with solids, heartburn, loss of appetite, nausea, vomiting, vomiting blood, weight loss, and  weight gain.      Reports rectal bleeding.     Denies anal fissure, black tarry stools, change in bowel habit, constipation, diarrhea, diverticulosis, fecal incontinence, heme positive stool, hemorrhoids, irritable bowel syndrome, jaundice, light color stool, liver problems, and  rectal pain.    Current Medications (verified): 1)  Asacol 400 Mg  Tbec (Mesalamine) .... 4 By Mouth Two  Times A Day 2)  Zestril 5 Mg Tabs (Lisinopril) .Marland Kitchen.. 1 By Mouth Once Daily 3)  Adult Aspirin Low Strength 81 Mg Tbdp (Aspirin) .... Take 1 Tablet By Mouth Once A Day 4)  Fish Oil 1000 Mg Caps (Omega-3 Fatty Acids) .... Take 1 Capsule By Mouth Three Times A Day 5)  Vitamin D3 1000 Unit Tabs (Cholecalciferol) .... Take 1 Tablet By Mouth Three Times A Day 6)  Hydrochlorothiazide 25 Mg Tabs (Hydrochlorothiazide) .Marland Kitchen.. 1 By Mouth Each Am 7)  Zocor 20 Mg Tabs (Simvastatin) .... Take 1 Tablet By Mouth Once A Day 8)  Flonase 50 Mcg/act Susp (Fluticasone Propionate) .... 2 Sprays in Each Nostril Once Daily As Needed 9)  Aciphex 20 Mg Tbec (Rabeprazole Sodium) .... Take 1 Tab Once Daily As  Needed  Allergies (verified): 1)  ! * Toprol Xl 2)  ! Codeine  Past History:  Past Medical History: Reviewed history from 10/30/2009 and no changes required. GERD Hyperlipidemia Hypertension obesity  former smoker ulcerative colitis  stress reaction Dyspnea   --ech and Myvoiew normal 2008  Past Surgical History: Reviewed history from 03/01/2007 and no changes required. GYN surgery- ruptured tubal pregnancy, one ovary removed (1975) Holter monitor- palpitations Pneumonia- outpatient (10/2006)  Family History: Reviewed history from 01/31/2008 and no changes required. Father: Heart problem Mother: Died 2004. Heart problem, thyroid growth, OP, HTN, GERD  Siblings: One sister with Crohn's disease  Social History: Reviewed history from 01/31/2008 and no changes required. Marital Status: Married Children: 1 son Occupation: retired from office Former Smoker- quit 1 /08  Review of Systems       The patient complains of arthritis/joint pain, back pain, headaches-new, night sweats, and swelling of feet/legs.  The patient denies allergy/sinus, anemia, anxiety-new, blood in urine, breast changes/lumps, change in vision, confusion, cough, coughing up blood, depression-new, fainting, fatigue, fever, hearing problems, heart murmur, heart rhythm changes, itching, menstrual pain, muscle pains/cramps, nosebleeds, pregnancy symptoms, shortness of breath, skin rash, sleeping problems, sore throat, swollen lymph glands, thirst - excessive , urination - excessive , urination changes/pain, urine leakage, vision changes, and voice change.    Vital Signs:  Patient profile:   65 year old female Pulse rate:   68 / minute Pulse rhythm:   regular BP sitting:  110 / 78  (left arm) Cuff size:   large  Vitals Entered By: June McMurray CMA Deborra Medina) (January 08, 2010 10:04 AM)   Impression & Recommendations:  Problem # 1:  COLITIS (ICD-558.9) Patient has proctitis which is well-controlled asacoll.   She seems to be doing better at the higher dose of 1600 mg twice a day.  Should she have recurrent bleeding then I would consider topical therapy with either hydrocortisone suppositories or ProctoFoam.  Because of a greater than 15 year history of colitis I have recommended to her that she undergo yearly surveillance colonoscopy to rule out dysplasia.  Problem # 2:  PERSONAL HISTORY OF COLONIC POLYPS (ICD-V12.72) Patient had an 18 mm  sessile serrated adenoma.  Recommendations #1 followup colonoscopy in one year  Weight was not documented today as patient declined; BMI could not be calculated.  Patient Instructions: 1)  Please schedule a follow up appointment in 1 year 2)  The medication list was reviewed and reconciled.  All changed / newly prescribed medications were explained.  A complete medication list was provided to the patient / caregiver. Prescriptions: ASACOL 400 MG  TBEC (MESALAMINE) 4 by mouth two  times a day  #960 x 3   Entered by:   Genella Mech CMA (AAMA)   Authorized by:   Inda Castle MD   Signed by:   Genella Mech CMA ([place]) on 01/08/2010   Method used:   Electronically to        [facility] [phone]* (retail)       [address]       [place], [place]  [address]       Ph: [phone]       Fax: [phone]   RxID:   [id number] ASACOL 400 MG  TBEC (MESALAMINE) 4 by mouth two  times a day  #960 x 3   Entered and Authorized by:   Inda Castle MD   Signed by:   Inda Castle MD on 01/08/2010   Method used:   Historical   RxID:   [id number]

## 2010-11-26 NOTE — Consult Note (Signed)
Summary: Concha Pyo MD  Concha Pyo MD   Imported By: Edmonia James 08/20/2010 08:29:34  _____________________________________________________________________  External Attachment:    Type:   Image     Comment:   External Document

## 2010-11-26 NOTE — Letter (Signed)
Summary: EGD Instructions  [facility]  [place], [place] [address]   Phone: [phone]  Fax: [phone]       Courtney Roth    Mar 18, 1946    MRN: [id number]       Procedure Day Sudie Grumbling:  tuesday 11/20/2009     Arrival Time:   3:00 pm     Procedure Time: 4:00 pm     Location of Procedure:                    _ x _ [place] (4th Floor)    PREPARATION FOR ENDOSCOPY   On Tuesday 11/20/2009 THE DAY OF THE PROCEDURE:  1.   No solid foods, milk or milk products are allowed after midnight the night before your procedure.  2.   Do not drink anything colored red or purple.  Avoid juices with pulp.  No orange juice.  3.  You may drink clear liquids until2:00 pm , which is 2 hours before your procedure.                                                                                                CLEAR LIQUIDS INCLUDE: Water Jello Ice Popsicles Tea (sugar ok, no milk/cream) Powdered fruit flavored drinks Coffee (sugar ok, no milk/cream) Gatorade Juice: apple, white grape, white cranberry  Lemonade Clear bullion, consomm, broth Carbonated beverages (any kind) Strained chicken noodle soup Hard Candy   MEDICATION INSTRUCTIONS  Unless otherwise instructed, you should take regular prescription medications with a small sip of water as early as possible the morning of your procedure. .  Additional medication instructions:  Do not take fluid pill am of procedure.             OTHER INSTRUCTIONS  You will need a responsible adult at least 67 years of age to accompany you and drive you home.   This person must remain in the waiting room during your procedure.  Wear loose fitting clothing that is easily removed.  Leave jewelry and other valuables at home.  However, you may wish to bring a book to read or an iPod/MP3 player to listen to music as you wait for your procedure to start.  Remove all body piercing jewelry and leave  at home.  Total time from sign-in until discharge is approximately 2-3 hours.  You should go home directly after your procedure and rest.  You can resume normal activities the day after your procedure.  The day of your procedure you should not:   Drive   Make legal decisions   Operate machinery   Drink alcohol   Return to work  You will receive specific instructions about eating, activities and medications before you leave.    The above instructions have been reviewed and explained to me by   Cornelia Copa RN  November 13, 2009 4:17 PM     I fully understand and can verbalize these instructions _____________________________ Date _________

## 2010-11-26 NOTE — Miscellaneous (Signed)
  Clinical Lists Changes Informed that pt has long standing pyrosis, partially improved with prilosec. Will change to aciphex 63m once daily and schedule EGD  Medications: Removed medication of PRILOSEC 20 MG CPDR (OMEPRAZOLE) 2 by mouth once daily Added new medication of ACIPHEX 20 MG TBEC (RABEPRAZOLE SODIUM) take 1 tab once daily - Signed Rx of ACIPHEX 20 MG TBEC (RABEPRAZOLE SODIUM) take 1 tab once daily;  #30 x 2;  Signed;  Entered by: RInda CastleMD;  Authorized by: RInda CastleMD;  [id number] [address] [place]  [address] Ph: [phone] Fax: [phone]   Prescriptions: ACIPHEX 20 MG TBEC (RABEPRAZOLE SODIUM) take 1 tab once daily  #30 x 2   Entered and Authorized by:   RInda CastleMD   Signed by:   RInda CastleMD on 11/01/2009   Method used:   Electronically to        [facility] #[id number] (retail)       [address]      [place]  [address]      Ph: [phone]      Fax: [phone]  RxID::   [id number]

## 2010-11-26 NOTE — Assessment & Plan Note (Signed)
Summary: 6MTH FOLLOW UP / LFW   Vital Signs:  Patient profile:   65 year old female Height:      59.25 inches Weight:      229.25 pounds BMI:     46.08 Temp:     98.6 degrees F oral Pulse rate:   84 / minute Pulse rhythm:   regular BP sitting:   100 / 74  (left arm) Cuff size:   large  Vitals Entered By: Ozzie Hoyle LPN (December 24, 6312 10:26 AM)  History of Present Illness: here to f/u for HTN and lipids  is better with her chest symptoms -- after GI workup   wt is down 3 lb has been working on it  is eating better - overall  is walking regularly for exercise 30 minutes -- [place]    bp is very good 100/74 - no problems with this   due to check on cholesterol -- is eating lower fat diet  rev last labs     Allergies: 1)  ! * Toprol Xl 2)  ! Codeine  Past History:  Past Medical History: Last updated: 10/30/2009 GERD Hyperlipidemia Hypertension obesity  former smoker ulcerative colitis  stress reaction Dyspnea   --ech and Myvoiew normal 2008  Past Surgical History: Last updated: 03/01/2007 GYN surgery- ruptured tubal pregnancy, one ovary removed (1975) Holter monitor- palpitations Pneumonia- outpatient (10/2006)  Family History: Last updated: 01/31/2008 Father: Heart problem Mother: Died 2004. 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 Eyes:  Denies blurring and eye pain. CV:  Denies chest pain or discomfort, lightheadness, and palpitations. Resp:  Denies cough, shortness of breath, and wheezing. GI:  Denies abdominal pain, bloody stools, and change in bowel habits. GU:  Denies discharge and  dysuria. MS:  Denies joint redness, joint swelling, and muscle aches. Derm:  Denies lesion(s), poor wound healing, and rash. Neuro:  Denies numbness and tingling. Endo:  Denies excessive thirst and excessive urination. Heme:  Denies abnormal bruising and bleeding.  Physical Exam  General:  overweight but generally well appearing  Head:  normocephalic, atraumatic, and no abnormalities observed.    Eyes:  vision grossly intact, pupils equal, pupils round, pupils reactive to light, and no injection.   Mouth:  pharynx pink and moist.   Neck:  supple with full rom and no masses or thyromegally, no JVD or carotid bruit  Lungs:  CTA with diffusely distant bs and no wheeze or crackles  Heart:  Normal rate and regular rhythm. S1 and S2 normal without gallop, murmur, click, rub or other extra sounds. Pulses:  R and L carotid,radial,femoral,dorsalis pedis and posterior tibial pulses are full and equal bilaterally Extremities:  No clubbing, cyanosis, edema, or deformity noted with normal full range of motion of all joints.   no tenderness or palp cords  Neurologic:  sensation intact to light touch, gait normal, and DTRs symmetrical and normal.   Skin:  Intact without suspicious lesions or rashes Cervical Nodes:  No lymphadenopathy noted Psych:  pt is somewhat irritable today - when disc her GI care and requirements for f/u  overall - somewhat her baseline- was pleasant to me    Impression & Recommendations:  Problem # 1:  HYPERTENSION (ICD-401.9) Assessment Unchanged  good control without change enc further diet and exercise  lab today f/u 6 mo  Her updated medication list for this problem includes:    Zestril 5 Mg Tabs (Lisinopril) .Marland Kitchen... 1 by mouth once daily    Hydrochlorothiazide 25 Mg Tabs (Hydrochlorothiazide) .Marland Kitchen... 1 by mouth each am  Orders: Venipuncture (71062) TLB-Lipid Panel (80061-LIPID) TLB-ALT (SGPT) (84460-ALT) TLB-AST (SGOT) (84450-SGOT) TLB-Renal Function Panel  (80069-RENAL)  BP today: 100/74 Prior BP: 112/70 (10/31/2009)  Labs Reviewed: K+: 4.1 (06/22/2009) Creat: : 1.1 (06/22/2009)   Chol: 168 (06/22/2009)   HDL: 37.50 (06/22/2009)   LDL: 100 (06/22/2009)   TG: 151.0 (06/22/2009)  Problem # 2:  HYPERLIPIDEMIA (ICD-272.4) Assessment: Unchanged  lipids due for a check  disc goal of LDL under 100 if poss rev low sat fat diet  f/u 6 mo for check up as well  Her updated medication list for this problem includes:    Zocor 20 Mg Tabs (Simvastatin) .Marland Kitchen... 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 4 by mouth two  times a day 2)  Zestril 5 Mg Tabs (Lisinopril) .Marland Kitchen.. 1 by mouth once daily 3)  Adult Aspirin Low Strength 81 Mg Tbdp (Aspirin) .... Take 1 tablet by mouth once a day 4)  Fish Oil 1000 Mg Caps (Omega-3 fatty acids) .... Take 1 capsule by mouth three times a day 5)  Vitamin D3 1000 Unit Tabs (Cholecalciferol) .... Take 1 tablet by mouth three times a day 6)  Hydrochlorothiazide 25 Mg Tabs (Hydrochlorothiazide) .Marland Kitchen.. 1 by mouth each am 7)  Zocor 20 Mg Tabs (Simvastatin) .... Take 1 tablet by mouth once a day 8)  Flonase 50 Mcg/act Susp (Fluticasone propionate) .... 2 sprays in each nostril once daily as needed 9)  Aciphex 20 Mg Tbec  (Rabeprazole sodium) .... Take 1 tab once daily as needed  Other Orders: Flu Vaccine 36yr + (([address] Admin 1st Vaccine ([phone]  Patient Instructions: 1)  blood pressure is good  2)  keep working on healthy diet and exercise and weight loss  3)  labs today  4)  no change in medicine  5)  schedule fasting lab in 6 mo lipid/renal/ hepatic/ cbc with diff/ tsh/ 401.1, 272 and then follow up for 30 minute check up   Current Allergies (reviewed today): ! * TOPROL XL ! CODEINE   Immunizations Administered:  Influenza Vaccine # 1:    Vaccine Type: Fluvax 3+    Site: right deltoid    Mfr: GlaxoSmithKline    Dose: 0.5 ml    Route: IM    Given by: ROzzie HoyleLPN    Exp. Date: 04/25/2010    Lot #: [id number]   VIS given: 05/20/07 version given December 24, 2009.  Flu Vaccine Consent Questions:    Do you have a history of severe  allergic reactions to this vaccine? no    Any prior history of allergic reactions to egg and/or gelatin? no    Do you have a sensitivity to the preservative Thimersol? no    Do you have a past history of Guillan-Barre Syndrome? no    Do you currently have an acute febrile illness? no    Have you ever had a severe reaction to latex? no    Vaccine information given and explained to patient? yes    Are you currently pregnant? no

## 2010-11-26 NOTE — Letter (Signed)
Summary: Office Visit Letter  [facility]  [address] [place], [place] [address]   Phone: [phone]  Fax: [phone]      December 05, 2009 MRN: [id number]   MARVEEN DONLON [address] [place], [place]  [address]   Dear Ms. Liptak,   According to our records, it is time for you to schedule a follow-up office visit with [place] in the month of March 2011.   At your convenience, please call [phone] (option #2)to schedule an office visit. If you have any questions, concerns, or feel that this letter is in error, we would appreciate your call.   Sincerely,  Sandy Salaam. Deatra Ina, M.D.   [facility] Gastroenterology Division [phone]

## 2010-11-26 NOTE — Miscellaneous (Signed)
Summary: Lec previsit  Clinical Lists Changes  Observations: Added new observation of ALLERGY REV: Done (11/13/2009 15:56)

## 2010-11-26 NOTE — Assessment & Plan Note (Signed)
Summary: er f/u from 12/5 still sick/dlo   Vital Signs:  Patient profile:   65 year old female Height:      59.25 inches Temp:     98.8 degrees F oral Pulse rate:   96 / minute Pulse rhythm:   regular BP sitting:   70 / 58  (right arm) Cuff size:   large  Vitals Entered By: Ozzie Hoyle LPN (July 09, 7901 12:37 PM)  Serial Vital Signs/Assessments:  Time      Position  BP       Pulse  Resp  Temp     By                     100/70                         Allena Earing MD  CC: F/u for [facility] ER visit on 07/01/10 Diverticulitis, pancreatitis and UTI were diagnosed at Er. Pt having lower back pain on spine. Hurts worse when moves and pain level when moves is a 10/ Pt not eating and stays tired and fever and cold sweats.   History of Present Illness: in hospital early this month -- diverticulits and uti  and pancreatitis (no alcohol)  no hx of gallstones  did CT -- " stomach was inflammed"  when she went there- stomach was hurting -- got better after few days   feels horrible in general  GI pain is better  tx with antibiotics  cipro and flagyl  is light headed and feels decreased appetite  2 more days of abx  is eating a little -- just no appetite  is not drinking enough fluids   back pain got a lot worse yesterday  was moving legs and trying to get out of bed  sudden worse pain  then walking and turning became excuriating , gettin gup and down  has had back pain for a long time  has never had it checked out by a specialist  pain is in the low back and in the middle  does not shoot down either leg no numb or weakness  knows her wt plays a role   no px pain meds  no otc pain med      Allergies: 1)  ! * Toprol Xl 2)  ! Codeine  Past History:  Past Medical History: Last updated: 10/30/2009 GERD Hyperlipidemia Hypertension obesity  former smoker ulcerative colitis  stress reaction Dyspnea   --ech and Myvoiew normal 2008  Past Surgical History: Last  updated: 03/01/2007 GYN surgery- ruptured tubal pregnancy, one ovary removed (1975) Holter monitor- palpitations Pneumonia- outpatient (10/2006)  Family History: Last updated: 01/31/2008 Father: Heart problem Mother: Died 2004. Heart problem, thyroid growth, OP, HTN, GERD  Siblings: One sister with Crohn's disease  Social History: Last updated: 01/31/2008 Marital Status: Married Children: 1 son Occupation: retired from office Former Smoker- quit 1 /08  Risk Factors: Smoking Status: quit (03/01/2007)  Review of Systems General:  Complains of fatigue and loss of appetite; denies chills, fever, and malaise. Eyes:  Denies blurring and eye irritation. ENT:  Denies sore throat. CV:  Denies chest pain or discomfort, palpitations, shortness of breath with exertion, and swelling of feet. Resp:  Denies cough, pleuritic, shortness of breath, and wheezing. GI:  Complains of abdominal pain, loss of appetite, and nausea; denies gas and vomiting. GU:  Denies dysuria, hematuria, and urinary frequency. MS:  Complains of low  back pain, mid back pain, and stiffness; denies muscle weakness. Derm:  Denies itching, lesion(s), poor wound healing, and rash. Neuro:  Denies headaches. Psych:  frustrated by not feeling well. Endo:  Denies cold intolerance, excessive thirst, excessive urination, and heat intolerance. Heme:  Denies abnormal bruising and bleeding.  Physical Exam  General:  overweight and uncomfortable appearing in wheelchair   Head:  normocephalic, atraumatic, and no abnormalities observed.   Eyes:  vision grossly intact, pupils equal, pupils round, and pupils reactive to light.  no conjunctival pallor, injection or icterus  Mouth:  pharynx pink and moist.   Neck:  supple with full rom and no masses or thyromegally, no JVD or carotid bruit  Chest Wall:  No deformities, masses, or tenderness noted. Lungs:  CTA with diffusely distant bs and no wheeze or crackles  Heart:  Normal rate  and regular rhythm. S1 and S2 normal without gallop, murmur, click, rub or other extra sounds. Abdomen:  exam sitting  soft and nt normal bowel sounds, no distention, no masses, no hepatomegaly, and no splenomegaly.   Msk:  no CVA tenderness  tender over LS L4- L5 and in bilat buttocks with spasm  pain to flex and extend gait is labored back pain reprod with bent knee raise but no leg pain   Extremities:  No clubbing, cyanosis, edema, or deformity noted with normal full range of motion of all joints.   no tenderness or palp cords  Neurologic:  strength normal in all extremities, sensation intact to light touch, gait normal, and DTRs symmetrical and normal.   Skin:  Intact without suspicious lesions or rashes Cervical Nodes:  No lymphadenopathy noted Inguinal Nodes:  No significant adenopathy Psych:  is in pain and complaining about that    Impression & Recommendations:  Problem # 1:  BACK PAIN, LUMBAR (ICD-724.2) Assessment New suspect recurrent muscle spasm- this time from hospitalization/ immobility obesity and deconditioning play role trial of flexeril / heat / slow return to movement  will need to eventually strengthen abs  imaging ordered in [place] The following medications were removed from the medication list:    Tramadol Hcl 50 Mg Tabs (Tramadol hcl) ..... One tablet by mouth every six hours. Her updated medication list for this problem includes:    Adult Aspirin Low Strength 81 Mg Tbdp (Aspirin) .Marland Kitchen... Take 1 tablet by mouth once a day    Flexeril 10 Mg Tabs (Cyclobenzaprine hcl) .Marland Kitchen... 1/2 to 1 by mouth up to three times a day as needed for back pain watch out for sedation  Orders: Radiology Referral (Radiology) Prescription Created Electronically [phone])  Problem # 2:  DIVERTICULITIS OF COLON (ICD-562.11) Assessment: New  now improved without fever or pain sent for hosp records to rev disc diet - avoid nuts and seeds update if symptoms  return  Orders: Prescription Created Electronically [id number])  Problem # 3:  PANCREATITIS (ICD-577.0) Assessment: New  s/p hosp now improved pt has no idea what caused it  no alcohol or gallstones noted sent for records  ? what labs will need to be followed disc slow return to reg diet   Orders: Prescription Created Electronically [phone])  Complete Medication List: 1)  Asacol 400 Mg Tbec (Mesalamine) .... 4 by mouth two  times a day 2)  Zestril 5 Mg Tabs (Lisinopril) .Marland Kitchen.. 1 by mouth once daily 3)  Adult Aspirin Low Strength 81 Mg Tbdp (Aspirin) .... Take 1 tablet by mouth once a day 4)  Fish Oil 1000 Mg  Caps (Omega-3 fatty acids) .... Take 1 capsule by mouth three times a day 5)  Vitamin D3 1000 Unit Tabs (Cholecalciferol) .... Take 1 tablet by mouth three times a day 6)  Hydrochlorothiazide 25 Mg Tabs (Hydrochlorothiazide) .Marland Kitchen.. 1 by mouth each am 7)  Zocor 20 Mg Tabs (Simvastatin) .... Take 1 tablet by mouth once a day 8)  Flonase 50 Mcg/act Susp (Fluticasone propionate) .... 2 sprays in each nostril once daily as needed 9)  Aciphex 20 Mg Tbec (Rabeprazole sodium) .... Take 1 tab once daily as needed 10)  Metronidazole 500 Mg Tabs (Metronidazole) .... One tablet by mouth every 8 hours for 10 days. 11)  Ciprofloxacin Hcl 500 Mg Tabs (Ciprofloxacin hcl) .... One tablet by mouth twice a day for 10 days. 12)  Flexeril 10 Mg Tabs (Cyclobenzaprine hcl) .... 1/2 to 1 by mouth up to three times a day as needed for back pain watch out for sedation  Patient Instructions: 1)  please send for ER records and labs and studies from [facility]  2)  we will do orthopedic referral at check out  3)  try the flexeril -- muscle relaxer for back pain --I will send to your pharmacy  4)  finish your antibiotics 5)  schedule 30 minute follow up in 2-4 weeks  Prescriptions: FLEXERIL 10 MG TABS (CYCLOBENZAPRINE HCL) 1/2 to 1 by mouth up to three times a day as needed for back pain watch out for sedation  #30 x  0   Entered and Authorized by:   Allena Earing MD   Signed by:   Allena Earing MD on 07/09/2010   Method used:   Electronically to        [facility] [phone]* (retail)       [address]       [place], [place]  [address]       Ph: [phone]       Fax: [phone]   RxID:   [id number]   Current Allergies (reviewed today): ! * TOPROL XL ! CODEINE

## 2010-11-26 NOTE — Letter (Signed)
Summary: Patient Notice- Polyp Results  [facility]  [address] [place], [place] [address]   Phone: [phone]  Fax: [phone]        November 06, 2009 MRN: [id number]    JEWELIA BOCCHINO [place], [place]  [address]    Dear Ms. Weissmann,  I am pleased to inform you that the colon polyp(s) removed during your recent colonoscopy was (were) found to be benign (no cancer detected) upon pathologic examination.  I recommend you have a repeat colonoscopy examination in 1_ years to look for recurrent polyps, as having colon polyps increases your risk for having recurrent polyps or even colon cancer in the future.  Should you develop new or worsening symptoms of abdominal pain, bowel habit changes or bleeding from the rectum or bowels, please schedule an evaluation with either your primary care physician or with me.  Additional information/recommendations:  __ No further action with gastroenterology is needed at this time. Please      follow-up with your primary care physician for your other healthcare      needs.  __ Please call [phone] to schedule a return visit to review your      situation.  __ Please keep your follow-up visit as already scheduled.  _x_ Continue treatment plan as outlined the day of your exam.  Please call us if you are having persistent problems or have questions about your condition that have not been fully answered at this time.  Sincerely,  Inda Castle MD  This letter has been electronically signed by your physician.  Appended Document: Patient Notice- Polyp Results Letter mailed 01.12.11

## 2010-11-26 NOTE — Medication Information (Signed)
Summary: Certified in total-PPI/[employer]BS[employer]  Certified in total-PPI/[employer]BS[employer]   Imported By: Phillis Knack 11/15/2009 08:06:07  _____________________________________________________________________  External Attachment:    Type:   Image     Comment:   External Document

## 2010-11-26 NOTE — Progress Notes (Signed)
Summary: Question about meds just sent over   Phone Note From Pharmacy   Caller: [facility]  [address] Flake. [phone] Call For: Dr Deatra Ina  Summary of Call: Questions about medicine nurse just called in for patient Initial call taken by: Irwin Brakeman [facility],  August 30, 2010 12:19 PM  Follow-up for Phone Call        Received a call from pts pharmacy. They do not have Proctofoam 1%. Want to substitiute Proctofoam HC. Per Nicoletta Ba, PA may substitute. Pharmacy notified. Follow-up by: Leone Payor RN,  August 30, 2010 1:32 PM

## 2010-11-26 NOTE — Letter (Signed)
Summary: [facility]   Imported By: Edmonia James 10/30/2009 12:10:14  _____________________________________________________________________  External Attachment:    Type:   Image     Comment:   External Document

## 2010-11-26 NOTE — Progress Notes (Signed)
Summary: Triage   Phone Note Call from Patient Call back at Home Phone [phone]   Caller: Patient Call For: Dr. Deatra Ina Summary of Call: Asking to speak directly to [facility]...would not give any info. Initial call taken by: Webb Laws,  September 06, 2010 8:12 AM  Follow-up for Phone Call        Patient states she didn't get the Lialda coupon's I have mailed them to her again. Follow-up by: Barb Merino RN, [place],  September 06, 2010 8:45 AM

## 2010-11-28 NOTE — Letter (Signed)
Summary: Colonoscopy Letter  [facility]  [place], [place] [address]   Phone: [phone]  Fax: [phone]      November 13, 2010 MRN: [id number]   Courtney Roth [address] [place], [place]  [address]   Dear Ms. Saldivar,   According to your medical record, it is time for you to schedule a Colonoscopy. The American Cancer Society recommends this procedure as a method to detect early colon cancer. Patients with a family history of colon cancer, or a personal history of colon polyps or inflammatory bowel disease are at increased risk.  This letter has been generated based on the recommendations made at the time of your procedure. If you feel that in your particular situation this may no longer apply, please contact our office.  Please call our office at [phone] to schedule this appointment or to update your records at your earliest convenience.  Thank you for cooperating with [place] to provide you with the very best care possible.   Sincerely,  Sandy Salaam. Deatra Ina, M.D.  [facility] Gastroenterology Division [phone]

## 2011-01-10 ENCOUNTER — Encounter: Payer: Self-pay | Admitting: Gastroenterology

## 2011-01-14 NOTE — Letter (Signed)
Summary: Office Visit Letter  [facility]  [address] [place], [place] [address]   Phone: [phone]  Fax: [phone]      January 10, 2011 MRN: [id number]   Courtney Roth [place], [place]  [address]   Dear [name],   According to our records, it is time for you to schedule a follow-up office visit with [place].   At your convenience, please call [phone] (option #2)to schedule an office visit. If you have any questions, concerns, or feel that this letter is in error, we would appreciate your call.   Sincerely,  Sandy Salaam. Deatra Ina, M.D.  [facility] Division [phone]

## 2011-02-24 ENCOUNTER — Other Ambulatory Visit: Payer: Self-pay | Admitting: Family Medicine

## 2011-02-25 ENCOUNTER — Other Ambulatory Visit: Payer: Self-pay | Admitting: *Deleted

## 2011-02-25 MED ORDER — LISINOPRIL 5 MG PO TABS: 5.0000 mg | ORAL_TABLET | Freq: Every day | ORAL | Status: DC

## 2011-03-11 NOTE — Assessment & Plan Note (Signed)
[facility] OFFICE NOTE   RASHAWNA, SCOLES                   MRN:          [id number]  DATE:02/18/2007                            DOB:          08-20-1946    PROBLEM:  Ulcerative colitis.   Mrs. Newby is a pleasant 65 year old white female referred through  the courtesy of Dr. Glori Bickers for evaluation.  She has a history of  ulcerative colitis for at least 10 to 15 years.  The last colonoscopy  was over 5 years ago.  She has had a fairly quiescent course.  She takes  no medicines regularly.  She normally has 2 to 3 loose stools a day.  With stress, she may have some rectal bleeding and will take a  Azulfidine for a short period of time.  As far as she knows, colitis has  affected her colon, although she is not aware of the distribution.  She  is without joint pains.   FAMILY HISTORY:  Pertinent for a sister with Crohn disease.   PAST MEDICAL HISTORY:  Hypertension.   MEDICATIONS:  Metoprolol daily.   ALLERGIES:  No allergies.   SOCIAL HISTORY:  She quit smoking in January.  She had been smoking one  to one-and-a-half packs a day.  She does not drink.  She is married.   REVIEW OF SYSTEMS:  Positive for feet swelling.   PHYSICAL EXAMINATION:  Pulse 80, blood pressure 146/90, weight 210 per  template.  Examination:  Normal.  Rectal:  Deferred.   IMPRESSION:  Ulcerative colitis.  While in clinical remission, I believe  that she should be on maintenance 5AC therapy.  Because of the reduced  incidence of colon cancer, the patient is on maintenance therapy.   RECOMMENDATIONS:  1. Colonoscopy.  2. Begin Asacol 800 mg three times a day.  The patient will consider      enrollment in IBD trial.     Sandy Salaam. Deatra Ina, MD,FACG  Electronically Signed   RDK/MedQ  DD: 02/18/2007  DT: 02/18/2007  Job #: [id number]   cc:   Marne A. Glori Bickers, MD

## 2011-03-11 NOTE — Assessment & Plan Note (Signed)
[facility] OFFICE NOTE   Courtney, Roth                   MRN:          [id number]  DATE:03/29/2007                            DOB:          Aug 04, 1946    PROBLEM:  Abdominal pain.   REASON FOR VISIT:  Courtney Roth has returned for a scheduled followup.  Her  colitis has been under control on Asacol.  She was complaining of right-  sided pain radiating to her groin.  This has significantly subsided.  Altogether she is feeling well.   PHYSICAL EXAMINATION:  VITAL SIGNS:  Pulse 78, blood pressure 130/80.  Weight 212.   IMPRESSION:  1. Left-sided colitis, in remission.  2. Musculoskeletal pain, resolved.   RECOMMENDATIONS:  1. Continue current medications.  2. Repeat colonoscopy in one year.     Sandy Salaam. Deatra Ina, MD,FACG  Electronically Signed    RDK/MedQ  DD: 03/29/2007  DT: 03/29/2007  Job #: [id number]

## 2011-03-11 NOTE — Letter (Signed)
February 18, 2007     RE:  CAPRI, VEALS  MRN:  [id number]  /  DOB:  1946/05/18   Dear Ms Zigmund Daniel:   It is my pleasure to have treated you recently as a new patient in my  office.  I appreciate your confidence and the opportunity to participate  in your care.   Since I do have a busy inpatient endoscopy schedule and office schedule,  my office hours vary weekly.  I am, however, available for emergency  calls every day through my office.  If I cannot promptly meet an urgent  office appointment, another one of our gastroenterologists will be able  to assist you.   My well-trained staff are prepared to help you at all times.  For  emergencies after office hours, a physician from our gastroenterology  section is always available through my 24-hour answering service.   While you are under my care, I encourage discussion of your questions  and concerns, and I will be happy to return your calls as soon as I am  available.   Once again, I welcome you as a new patient and I look forward to a happy  and healthy relationship.    Sincerely,      Sandy Salaam. Deatra Ina, MD,FACG  Electronically Signed    RDK/MedQ  DD: 02/18/2007  DT: 02/18/2007  Job #: [id number]

## 2011-03-11 NOTE — Assessment & Plan Note (Signed)
[facility] OFFICE NOTE   Courtney Roth, Courtney Roth                   MRN:          [id number]  DATE:05/17/2007                            DOB:          06/06/1946    PRIMARY CARE PHYSICIAN:  Marne A. Tower, MD   INTERVAL HISTORY:  Courtney Roth is a very pleasant 65 year old woman  with a history of hypertension, hyperlipidemia, obesity and tobacco use,  which she recently quit, as well as Crohn's disease who presents for a  routine followup.   We saw her for the first time in May complaining of dyspnea on exertion  and palpitations.  We discussed possible catheterization versus stress  testing.  We decided to proceed with stress testing.  She had an  exercise Myoview.  She walked for 4 minutes with no significant ST  changes.  EF was 83%.  There was no ischemia.  Echocardiogram showed an  EF of 55% to 60% with no significant valvular disease.   She also had a 48 hour Holter monitor.  During this time she did not  have any events.  Her monitor showed sinus rhythm with occasional PACs.   Overall she says she feels much better.  She still gets somewhat winded  with moderate activities but no chest pain and palpitations have  resolved.   CURRENT MEDICATIONS:  1. Lisinopril 5 daily.  2. Asacol.  3. Aspirin 81 daily.   PHYSICAL EXAMINATION:  She is in no acute distress.  She ambulates  around the clinic without any respiratory difficulty.  Her blood pressure is 128/82. Heart rate is 88, weight is 216.  HEENT:  Normal.  NECK:  Supple and thick.  No significant JVD.  Carotids are 2+  bilaterally without any bruits.  There is no lymphadenopathy or  thyromegaly.  CARDIAC:  PMI is nondisplaced.  Shows a regular rate and rhythm with no  murmurs, rubs or gallops.  LUNGS:  Clear.  ABDOMEN:  Obese, nontender, nondistended, no obvious hepatosplenomegaly.  No bruits, no masses.  Good bowel sounds.  EXTREMITIES:  Warm  with no cyanosis, clubbing or edema.  No rash.  NEURO:  Alert and oriented x3. Cranial nerves II through XII grossly  intact.  Moves all 4 extremities without difficulty. Affect is bright.   ASSESSMENT AND PLAN:  Dyspnea on exertion.  This is somewhat improved.  Given her testing I suspect this is related to her obesity and  deconditioning.  I once again recommended  weight loss and an exercise program.  I told her I would like to see her  back in 4 months just to check how her progress is going.  Hopefully she  will be down by at least 10 pounds at this point.     Shaune Pascal. Bensimhon, MD  Electronically Signed    DRB/MedQ  DD: 05/17/2007  DT: 05/17/2007  Job #: [id number]   cc:   Marne A. Glori Bickers, MD

## 2011-03-11 NOTE — Letter (Signed)
February 18, 2007     RE:  [name], [name]  MRN:  [id number]  /  DOB:  1946/02/06   Dear Dr. Loura Pardon:   Upon your kind referral, I had the pleasure of evaluating your patient  and I am pleased to offer my findings.  I saw Ms Courtney Roth in  the office today.  Enclosed is a copy of my progress note that details  my findings and recommendations.   Thank you for the opportunity to participate in your patient's care.    Sincerely,      Sandy Salaam. Deatra Ina, MD,FACG  Electronically Signed    RDK/MedQ  DD: 02/18/2007  DT: 02/18/2007  Job #: [id number]

## 2011-03-11 NOTE — Assessment & Plan Note (Signed)
[facility] OFFICE NOTE   Courtney, Roth                   MRN:          [id number]  DATE:03/25/2007                            DOB:          Apr 06, 1946    REASON FOR CONSULTATION:  Dyspnea on exertion and palpitations.   HISTORY OF PRESENT ILLNESS:  Courtney Roth is a delightful 65 year old  woman with no known heart disease.  She has never had a stress test or  cardiac catheterization.  She does have a history of hypertension,  hyperlipidemia, and tobacco use, which she has recently stopped.  She  tells me that she has had dyspnea on exertion for close to 10 years now.  However, most of her time was tied up taking care of her elderly mother  who died several years ago.  In 11/29/2006 she was admitted with  pneumonia, and she says since that time her shortness of breath has been  much worse, so she finally decided to get it checked.  She denies chest  pain, but does note that she has occasional chest pressure, particularly  when lying down.  This is not worse with exertion.  She denies any  wheezing.  No cough, no fevers, or chills.  She does have occasional  ankle swelling, but no orthopnea or PND.  She does have a history of  frequent PVCs, and does note brief fluttering at times, but no sustained  tachy palpitations.  She has not had syncope or presyncope.  She says  she has gained a significant amount of weight since her mother died a  few years back.  She is sedentary.   REVIEW OF SYSTEMS:  Notable for some arthritis pain, and some  gastroesophageal reflux disease, as well as Crohn's colitis, which has  been relatively quiescent.  The remainder of the review of systems is  negative, except for HPI and problem list. She denies heavy snoring, but  she says it is hard for her to know.  She has not had significant  daytime sleepiness.   PROBLEM LIST:  1. Hypertension.  2. Hyperlipidemia.  3.  Tobacco use.  Quit in Nov 29, 2006.  4. Gastroesophageal reflux disease.  5. Crohn's disease.  6. Palpitations with frequent premature ventricular contractions.  7. Obesity.  8. History of pneumonia in 29-Nov-2006.   CURRENT MEDICATIONS:  1. Lisinopril 5 mg a day.  2. Asacol 80 mg t.i.d.   ALLERGIES:  NO KNOWN DRUG ALLERGIES.   SOCIAL HISTORY:  She is married with 2 kids.  She lives in [place].  She  is retired.  History of tobacco up to 1-1/2 packs per day.  Quit in  11-29-06.   FAMILY HISTORY:  Mother died at age of old age.  Father died at 58.  Does have a history of heart disease.  Sister has Crohn's.   PHYSICAL EXAMINATION:  She is well appearing, in no acute distress.  Ambulates around the clinic without any respiratory difficulty.  Blood pressure initially 158/82, on manual recheck 140/88.  Heart rate  is 78.  Weight is 211.  HEENT:  Normal.  NECK:  Supple.  No JVD.  Carotids are 2+ bilaterally without any bruits.  There is no lymphadenopathy or thyromegaly.  CARDIAC:  Regular rate and rhythm with no murmurs, rubs, or gallops.  PMI is nondisplaced.  There in no pain on palpation over the chest wall.  LUNGS:  Clear with mildly prolonged expiratory phase.  ABDOMEN:  Obese, non-tender, non-distended.  No obvious  hepatosplenomegaly.  No bruits.  No masses.  Good bowel sounds.  EXTREMITIES:  Warm with no cyanosis, clubbing, or edema.  No rash.  NEUROLOGIC:  Alert and oriented x3.  Cranial nerves 2 through 12 are  intact.  Moves all 4 extremities without difficulty.  Affect is bright.  EKG:  Shows normal sinus rhythm with occasional PVCs.  Heart rate is 88  beats per minute.  No significant ST-T wave abnormalities.   ASSESSMENT AND PLAN:  Dyspnea on exertion with mild chest pressure.  She  does have multiple risk factors for coronary artery disease.  We will  start with a 2-D echocardiogram and treadmill Myoview to further  evaluate.  We did discuss the possibility of  catheterization, but I  think a noninvasive approach would be the best way to start.  We will  also check PFTs to evaluate for underlying lung disease.  We will start  her on aspirin 81 mg a day.  We can also consider possible sleep study  in the future.   If her tests are relatively normal, I think it would be very reasonable  for her to start a diet and exercise program in an effort  to lose  weight and get in better conditioning.  Finally, given her pneumonia  several months ago, I do think it is reasonable to check a chest x-ray  to make sure everything looks normal, and there is no cause for  obstructive pneumonia.     Shaune Pascal. Bensimhon, MD  Electronically Signed    DRB/MedQ  DD: 03/25/2007  DT: 03/25/2007  Job #: [id number]   cc:   Marne A. Glori Bickers, MD

## 2011-03-11 NOTE — Assessment & Plan Note (Signed)
[place] OFFICE NOTE   Courtney Roth, Courtney Roth                   MRN:          [id number]  DATE:03/16/2007                            DOB:          03-Jun-1946    PROBLEM:  Ulcerative colitis.   Courtney Roth has returned following colonoscopy. Colonoscopy  demonstrated very mild inflammatory changes in the left colon. Biopsies  demonstrated focal chronic active colitis. Several diminutive polyps  were removed. No adenomatous changes were seen. She has been taking  Asacol 800 mg t.i.d. Courtney Roth does complain of mild right-sided  pain consisting of pain in the right groin radiating through her back.  It is worsened with bending or turning. She denies dysuria or urinary  frequency.   PHYSICAL EXAMINATION:  VITAL SIGNS:  Pulse 82, blood pressure 120/70,  weight 211.  ABDOMEN:  There is mild right lower quadrant tenderness without guarding  or rebound. There are no abdominal masses or organomegaly. There is no  CVA tenderness.   IMPRESSION:  1. Left-sided colitis - in remission.  2. Probable musculoskeletal abdominal pain.   RECOMMENDATIONS:  1. Continue Asacol 800 mg t.i.d.  2. I instructed Courtney Roth to contact me early next week if she      still has pain at which point, I would consider holding her Asacol.     Sandy Salaam. Deatra Ina, MD,FACG  Electronically Signed    RDK/MedQ  DD: 03/16/2007  DT: 03/16/2007  Job #: [id number]   cc:   Marne A. Glori Bickers, MD

## 2011-03-21 ENCOUNTER — Ambulatory Visit: Payer: Self-pay | Admitting: Family Medicine

## 2011-03-29 ENCOUNTER — Encounter: Payer: Self-pay | Admitting: Family Medicine

## 2011-03-31 ENCOUNTER — Ambulatory Visit (INDEPENDENT_AMBULATORY_CARE_PROVIDER_SITE_OTHER): Payer: Medicare Other | Admitting: Family Medicine

## 2011-03-31 ENCOUNTER — Encounter: Payer: Self-pay | Admitting: Family Medicine

## 2011-03-31 VITALS — BP 110/70 | HR 80 | Temp 98.4°F | Ht 59.25 in | Wt 222.0 lb

## 2011-03-31 DIAGNOSIS — E538 Deficiency of other specified B group vitamins: Secondary | ICD-10-CM

## 2011-03-31 DIAGNOSIS — L293 Anogenital pruritus, unspecified: Secondary | ICD-10-CM

## 2011-03-31 DIAGNOSIS — R209 Unspecified disturbances of skin sensation: Secondary | ICD-10-CM

## 2011-03-31 DIAGNOSIS — R739 Hyperglycemia, unspecified: Secondary | ICD-10-CM | POA: Insufficient documentation

## 2011-03-31 DIAGNOSIS — N898 Other specified noninflammatory disorders of vagina: Secondary | ICD-10-CM

## 2011-03-31 DIAGNOSIS — R300 Dysuria: Secondary | ICD-10-CM

## 2011-03-31 DIAGNOSIS — R7309 Other abnormal glucose: Secondary | ICD-10-CM

## 2011-03-31 DIAGNOSIS — R200 Anesthesia of skin: Secondary | ICD-10-CM

## 2011-03-31 LAB — POCT URINALYSIS DIPSTICK
Bilirubin, UA: NEGATIVE
Blood, UA: NEGATIVE
Nitrite, UA: NEGATIVE
pH, UA: 5

## 2011-03-31 MED ORDER — TERCONAZOLE 0.4 % VA CREA: TOPICAL_CREAM | VAGINAL | Status: DC

## 2011-03-31 NOTE — Assessment & Plan Note (Signed)
Level today and update

## 2011-03-31 NOTE — Progress Notes (Signed)
Subjective:    Patient ID: Courtney Roth, female    DOB: September 23, 1946, 65 y.o.   MRN: [id number]  HPI Here for toe numbness R small toe  Has some new athletic shoes -- they fit well  (wide feet- went a size up ) and problem started before that  Likes crocks usually Can feel her toe / but not normal feeling  Does not tingle  Sore at times/ but not burning  Notices it at night  Other toes and fingers are fine   Low back trouble is about the same   Urine smells different -- and eating better (? If that has something to do with it ?) Some pubic itching - on the outside  Did shave and that created a rash  No vaginal discharge   Has had borderline blood sugar in past  Is eager to check for DM  Is overwt - and fam hx  Knows she needs to work on weight loss  Wt is up 7 lb   Past Medical History  Diagnosis Date  . GERD (gastroesophageal reflux disease)   . Hyperlipidemia   . Hypertension   . Obesity   . Ulcerative colitis   . Stress reaction   . Dyspnea   . Pneumonia     History   Social History  . Marital Status: Married    Spouse Name: N/A    Number of Children: N/A  . Years of Education: N/A   Occupational History  . Not on file.   Social History Main Topics  . Smoking status: Former Smoker    Quit date: 10/27/2006  . Smokeless tobacco: Not on file  . Alcohol Use:   . Drug Use:   . Sexually Active:    Other Topics Concern  . Not on file   Social History Narrative  . No narrative on file   Courtney Roth does not currently have medications on file.  Family History  Problem Relation Age of Onset  . Heart disease Mother   . Hypertension Mother   . GER disease Mother   . Osteoporosis Mother   . Heart disease Father   . Crohn's disease Sister         Review of Systems Review of Systems  Constitutional: Negative for fever, appetite change, fatigue and unexpected weight change.  Eyes: Negative for pain and visual disturbance.  Respiratory:  Negative for cough and shortness of breath.   Cardiovascular: neg for cp or sob or edema or palp.   Gastrointestinal: Negative for nausea, diarrhea and constipation.  Genitourinary: Negative for urgency and frequency.  Skin: Negative for pallor.  Neurological: Negative for weakness, light-headedness, and headaches. pos for numbness  Hematological: Negative for adenopathy. Does not bruise/bleed easily.  Psychiatric/Behavioral: Negative for dysphoric mood. The patient is not nervous/anxious.          Objective:   Physical Exam  Constitutional: She appears well-developed and well-nourished. No distress.       overwt and well appearing   HENT:  Head: Normocephalic and atraumatic.  Mouth/Throat: Oropharynx is clear and moist.  Eyes: Conjunctivae and EOM are normal. Pupils are equal, round, and reactive to light.  Neck: Normal range of motion. Neck supple. No JVD present. Carotid bruit is not present. No thyromegaly present.  Cardiovascular: Normal rate, regular rhythm, normal heart sounds and intact distal pulses.   Pulmonary/Chest: Breath sounds normal. No respiratory distress. She has no wheezes.       Diffusely distant bs  Abdominal:  Soft. Bowel sounds are normal. She exhibits no abdominal bruit. There is no tenderness.  Musculoskeletal: Normal range of motion. She exhibits no edema and no tenderness.  Lymphadenopathy:    She has no cervical adenopathy.  Neurological: She has normal strength and normal reflexes. No cranial nerve deficit or sensory deficit. Coordination and gait normal.  Skin: Skin is warm and dry. No rash noted. No erythema. No pallor.  Psychiatric: She has a normal mood and affect.          Assessment & Plan:

## 2011-03-31 NOTE — Patient Instructions (Signed)
Do not shave irritated area anymore - trimming is ok  Try terazol cream as directed  Numb toe could be from shoes/ new treadmill activity/ back problems/ less likely diabetes Labs today Start cross training instead of just treadmill- bike/ elliptical -- mix it up  If not improved let me know

## 2011-03-31 NOTE — Assessment & Plan Note (Signed)
Odd presentation- R small toe ? If rel to exercise/shoes/ back prob or sugar problem Recommend cross train  Check a1c and also B12 today and adv Update if not imp

## 2011-03-31 NOTE — Assessment & Plan Note (Signed)
Suspect from shaving  Yeast may also be present externally Trial of terazol Stop shaving pubic area  Update if not imp

## 2011-03-31 NOTE — Assessment & Plan Note (Signed)
With wt gain - early DM is a risk Also numb toe - but not other neuropathy symptoms Disc low glycemic diet  Enc exercise- commended on this  a1c today and adv

## 2011-04-10 ENCOUNTER — Telehealth: Payer: Self-pay | Admitting: *Deleted

## 2011-04-10 DIAGNOSIS — M545 Low back pain: Secondary | ICD-10-CM

## 2011-04-10 DIAGNOSIS — R200 Anesthesia of skin: Secondary | ICD-10-CM

## 2011-04-10 NOTE — Telephone Encounter (Signed)
I will do neurology ref Will route to [facility]

## 2011-04-10 NOTE — Telephone Encounter (Signed)
Appt made with Dr Manuella Ghazi on 06/05/2011 at 3pm.

## 2011-04-10 NOTE — Telephone Encounter (Signed)
Pt was seen for numbness in her toe.  States nothing has resolved and she is asking for referral to a specialist.  She says she has also had some back problems and she thinks she has a pinched nerve that is making her toe numb.  She prefers to see someone in [place].

## 2011-05-07 ENCOUNTER — Other Ambulatory Visit: Payer: Self-pay | Admitting: Gastroenterology

## 2011-05-19 ENCOUNTER — Other Ambulatory Visit: Payer: Self-pay | Admitting: Family Medicine

## 2011-05-26 ENCOUNTER — Other Ambulatory Visit: Payer: Self-pay | Admitting: *Deleted

## 2011-05-26 MED ORDER — HYDROCHLOROTHIAZIDE 25 MG PO TABS: 25.0000 mg | ORAL_TABLET | Freq: Every day | ORAL | Status: DC

## 2011-06-06 ENCOUNTER — Other Ambulatory Visit: Payer: Self-pay | Admitting: Gastroenterology

## 2011-06-16 ENCOUNTER — Telehealth: Payer: Self-pay | Admitting: *Deleted

## 2011-06-16 NOTE — Telephone Encounter (Signed)
Done and I handed off to [place]

## 2011-06-16 NOTE — Telephone Encounter (Signed)
Pt is asking that form for handicapped placard be completed, due to her pinched nerve.  Form is on your shelf.

## 2011-06-17 NOTE — Telephone Encounter (Signed)
Advised pt form is ready for pick up, form placed up front, copy made for scanning.

## 2011-07-08 ENCOUNTER — Other Ambulatory Visit (INDEPENDENT_AMBULATORY_CARE_PROVIDER_SITE_OTHER): Payer: Medicare Other

## 2011-07-08 DIAGNOSIS — R7309 Other abnormal glucose: Secondary | ICD-10-CM

## 2011-07-08 DIAGNOSIS — R739 Hyperglycemia, unspecified: Secondary | ICD-10-CM

## 2011-07-15 ENCOUNTER — Ambulatory Visit: Payer: Medicare Other | Admitting: Family Medicine

## 2011-07-28 ENCOUNTER — Encounter: Payer: Self-pay | Admitting: Family Medicine

## 2011-07-28 ENCOUNTER — Ambulatory Visit (INDEPENDENT_AMBULATORY_CARE_PROVIDER_SITE_OTHER): Payer: Medicare Other | Admitting: Family Medicine

## 2011-07-28 VITALS — BP 108/70 | HR 88 | Temp 98.3°F | Ht 59.25 in | Wt 221.0 lb

## 2011-07-28 DIAGNOSIS — I10 Essential (primary) hypertension: Secondary | ICD-10-CM

## 2011-07-28 DIAGNOSIS — Z23 Encounter for immunization: Secondary | ICD-10-CM

## 2011-07-28 DIAGNOSIS — E538 Deficiency of other specified B group vitamins: Secondary | ICD-10-CM

## 2011-07-28 DIAGNOSIS — M545 Low back pain, unspecified: Secondary | ICD-10-CM

## 2011-07-28 DIAGNOSIS — R21 Rash and other nonspecific skin eruption: Secondary | ICD-10-CM

## 2011-07-28 DIAGNOSIS — E785 Hyperlipidemia, unspecified: Secondary | ICD-10-CM

## 2011-07-28 DIAGNOSIS — R7309 Other abnormal glucose: Secondary | ICD-10-CM

## 2011-07-28 DIAGNOSIS — R739 Hyperglycemia, unspecified: Secondary | ICD-10-CM

## 2011-07-28 NOTE — Progress Notes (Signed)
Subjective:    Patient ID: Courtney Roth, female    DOB: [birth year], 65 y.o.   MRN: [id number]  HPI Here for f/u of B12 def and hyperglycemia and HTN and new itching  Feeling fair overall   Is having itching problem all over , even in scalp  Nothing different med or otc or products No abx lately  Has a rash - started on her chest and then on her arms and hands - got better and now fresh red bumps on arms again , and back of shoulder  No sun exposure   No sneeze or cough or wheeze    HTN in good control 108/70 On ace Doing well  No ha or palp or cp  Wt is stable with bmi of 44  A1c is 5.9- imp from 6.1 Diet --does not know what to do  Uses trevia instead of sugar  Hard to loose wt after quitting smoking  Exercise  Her R big toe was more numb so she was worried - was doing PT (not helpful) Low back pain and it is radiating down to her legs  Thinks it is a pinched nerve  Had to stop going to the gym  - is seeing Dr Morley Kos back in feb   B12 level was in 300s in June   Patient Active Problem List  Diagnoses  . COLONIC POLYPS, HYPERPLASTIC  . VITAMIN B12 DEFICIENCY  . HYPERLIPIDEMIA  . OBESITY  . STRESS REACTION, ACUTE, WITH EMOTIONAL DISTURBANCE  . HYPERTENSION  . GERD  . COLITIS, ULCERATIVE  . COLITIS  . DIVERTICULITIS OF COLON  . PANCREATITIS  . BACK PAIN, LUMBAR  . EDEMA LEG  . DYSPNEA ON EXERTION  . PERSONAL HISTORY OF COLONIC POLYPS  . TOBACCO USE, QUIT  . Hyperglycemia  . Numbness in feet  . Itching in the vaginal area  . Rash and nonspecific skin eruption   Past Medical History  Diagnosis Date  . GERD (gastroesophageal reflux disease)   . Hyperlipidemia   . Hypertension   . Obesity   . Ulcerative colitis   . Stress reaction   . Dyspnea   . Pneumonia    Past Surgical History  Procedure Date  . Ruptured tubal pregnancy 1975    1 ovary removed   History  Substance Use Topics  . Smoking status: Former Smoker    Quit date:  10/27/2006  . Smokeless tobacco: Not on file  . Alcohol Use:    Family History  Problem Relation Age of Onset  . Heart disease Mother   . Hypertension Mother   . GER disease Mother   . Osteoporosis Mother   . Heart disease Father   . Crohn's disease Sister    Allergies  Allergen Reactions  . Codeine   . Metoprolol Succinate     REACTION: sob, fatigue   Current Outpatient Prescriptions on File Prior to Visit  Medication Sig Dispense Refill  . aspirin 81 MG tablet Take 81 mg by mouth daily.        Marland Kitchen CALCIUM-VITAMIN D PO Take 1 tablet by mouth daily.        . hydrochlorothiazide 25 MG tablet Take 1 tablet (25 mg total) by mouth daily.  30 tablet  2  . LIALDA 1.2 G EC tablet TAKE 2 TABLETS BY MOUTH EVERY DAY  60 tablet  6  . lisinopril (PRINIVIL,ZESTRIL) 5 MG tablet Take 1 tablet (5 mg total) by mouth daily.  30 tablet  11  .  MAGNESIUM ASPARTATE PO Take 1 tablet by mouth daily.        . simvastatin (ZOCOR) 20 MG tablet Take 20 mg by mouth at bedtime.        . Cholecalciferol (VITAMIN D3) 1000 UNITS CAPS Take 1 capsule by mouth 3 (three) times daily.        . cyclobenzaprine (FLEXERIL) 10 MG tablet Take 5-10 mg by mouth 3 (three) times daily as needed. For back pain. Watch out for sedation.       . fluticasone (FLONASE) 50 MCG/ACT nasal spray Place 2 sprays into the nose daily as needed.        . Omega-3 Fatty Acids (FISH OIL) 1000 MG CAPS Take 1 capsule by mouth 3 (three) times daily.        Marland Kitchen terconazole (TERAZOL 7) 0.4 % vaginal cream Apply to affected areas once daily for 7 days  45 g  0      Review of Systems Review of Systems  Constitutional: Negative for fever, appetite change, fatigue and unexpected weight change.  Eyes: Negative for pain and visual disturbance.  Respiratory: Negative for cough and shortness of breath.   Cardiovascular: Negative for cp or palpitations    Gastrointestinal: Negative for nausea, diarrhea and constipation.  Genitourinary: Negative for  urgency and frequency. neg for excessive thirst  Skin: Negative for pallor pos for rash and itching  Neurological: Negative for weakness, light-headedness,  and headaches.  Hematological: Negative for adenopathy. Does not bruise/bleed easily.  Psychiatric/Behavioral: Negative for dysphoric mood. The patient is not nervous/anxious.          Objective:   Physical Exam  Constitutional: She appears well-developed and well-nourished. No distress.       Obese and well appearing/ scratching rash  HENT:  Head: Normocephalic and atraumatic.  Mouth/Throat: Oropharynx is clear and moist.  Eyes: Conjunctivae and EOM are normal. Pupils are equal, round, and reactive to light. Right eye exhibits no discharge. Left eye exhibits no discharge.  Neck: Normal range of motion. Neck supple. No JVD present. Carotid bruit is not present. Erythema present. No thyromegaly present.  Cardiovascular: Normal rate, regular rhythm, normal heart sounds and intact distal pulses.  Exam reveals no gallop.   Pulmonary/Chest: Effort normal and breath sounds normal. No respiratory distress. She has no wheezes. She has no rales. She exhibits no tenderness.  Abdominal: Soft. Bowel sounds are normal. She exhibits no distension, no abdominal bruit and no mass. There is no tenderness.  Musculoskeletal: Normal range of motion. She exhibits tenderness. She exhibits no edema.       LS is tender  Lymphadenopathy:    She has no cervical adenopathy.  Neurological: She is alert. She has normal reflexes. No cranial nerve deficit. Coordination normal.  Skin: Skin is warm and dry. Rash noted. No erythema. No pallor.       Rash on upper back / arms / chest . Lower legs - papular and very slt red  Pt is scratching and has long nails No s/s of infection seen   Psychiatric: She has a normal mood and affect.          Assessment & Plan:

## 2011-07-28 NOTE — Patient Instructions (Signed)
Sugar is slightly improved  Keep with low sugar diet - exercise as able Work on weight loss  For itch try zyrtec 10 mg once daily over the counter  Stay as cool as possible I'm not sure yet what is causing allergic reaction so -- to start , hold your zocor (simvastatin ) for 1-2 week and then call and update me with how rash is doing  We will refer you Dr Manuella Ghazi at check out for earlier appt for back pain

## 2011-07-31 NOTE — Assessment & Plan Note (Signed)
This is worse Will ref pt back to Dr Manuella Ghazi at check out for earlier f/u

## 2011-07-31 NOTE — Assessment & Plan Note (Signed)
Mildly improved Commended lower sugar diet  Enc more work on wt loss and exercise as tolerated

## 2011-07-31 NOTE — Assessment & Plan Note (Signed)
Good control  No change in tx unless we think one of her meds is causing rash

## 2011-07-31 NOTE — Assessment & Plan Note (Signed)
Level is ok  Will continue current suppl

## 2011-07-31 NOTE — Assessment & Plan Note (Signed)
?   Cause Suspicious for drug rash  Will temp stop zocor If not imp - may stop ace or hct  Will update Use zyrtec for itch Cut nails short to prev infx from scratching  No s/s of angioedema

## 2011-07-31 NOTE — Assessment & Plan Note (Signed)
Has been controlled with zocor (fair diet) Rev last lab and low sat fat diet  Will however hold this for 1-2 weeks to see if it is rel to rash

## 2011-08-11 ENCOUNTER — Encounter: Payer: Self-pay | Admitting: Family Medicine

## 2011-08-11 ENCOUNTER — Ambulatory Visit (INDEPENDENT_AMBULATORY_CARE_PROVIDER_SITE_OTHER): Payer: Medicare Other | Admitting: Family Medicine

## 2011-08-11 DIAGNOSIS — R8290 Unspecified abnormal findings in urine: Secondary | ICD-10-CM | POA: Insufficient documentation

## 2011-08-11 DIAGNOSIS — R82998 Other abnormal findings in urine: Secondary | ICD-10-CM

## 2011-08-11 DIAGNOSIS — R109 Unspecified abdominal pain: Secondary | ICD-10-CM | POA: Insufficient documentation

## 2011-08-11 LAB — COMPREHENSIVE METABOLIC PANEL
BUN: 16 mg/dL (ref 6–23)
CO2: 29 mEq/L (ref 19–32)
Calcium: 9.7 mg/dL (ref 8.4–10.5)
Chloride: 97 mEq/L (ref 96–112)
Creatinine, Ser: 1.1 mg/dL (ref 0.4–1.2)
GFR: 53.38 mL/min — ABNORMAL LOW (ref >60.00)
Total Bilirubin: 0.6 mg/dL (ref 0.3–1.2)

## 2011-08-11 LAB — POCT UA - MICROSCOPIC ONLY

## 2011-08-11 LAB — POCT URINALYSIS DIPSTICK
Bilirubin, UA: NEGATIVE
pH, UA: 6

## 2011-08-11 LAB — CBC WITH DIFFERENTIAL/PLATELET
Basophils Relative: 0.2 % (ref 0.0–3.0)
Eosinophils Absolute: 0 10*3/uL (ref 0.0–0.7)
HCT: 45 % (ref 36.0–46.0)
Hemoglobin: 15 g/dL (ref 12.0–15.0)
Lymphocytes Relative: 15.2 % (ref 12.0–46.0)
Lymphs Abs: 2.2 10*3/uL (ref 0.7–4.0)
MCHC: 33.4 g/dL (ref 30.0–36.0)
Monocytes Relative: 7 % (ref 3.0–12.0)
Neutro Abs: 11.1 10*3/uL — ABNORMAL HIGH (ref 1.4–7.7)
RBC: 4.88 Mil/uL (ref 3.87–5.11)
RDW: 14.4 % (ref 11.5–14.6)

## 2011-08-11 LAB — AMYLASE: Amylase: 66 U/L (ref 27–131)

## 2011-08-11 LAB — LIPASE: Lipase: 27 U/L (ref 11.0–59.0)

## 2011-08-11 MED ORDER — CIPROFLOXACIN HCL 250 MG PO TABS: 250.0000 mg | ORAL_TABLET | Freq: Two times a day (BID) | ORAL | Status: AC

## 2011-08-11 MED ORDER — PROMETHAZINE HCL 25 MG PO TABS: 25.0000 mg | ORAL_TABLET | Freq: Four times a day (QID) | ORAL | Status: DC | PRN

## 2011-08-11 NOTE — Assessment & Plan Note (Addendum)
In epigastric area now running around R side to flank and back  Diff incl gallbladder problem, kidney stone, gastritis, viral Also has a low grade fever / malaise/ nausea  ua is mildly abn- will cx that /do labs / empirically tx for uti and consider CT if not imp Depending on labs and resp could also consider gallbladder imaging  Lab today - cbc/ amy/ lip/ cmet - and adv

## 2011-08-11 NOTE — Patient Instructions (Signed)
Start on the nausea pill (will make you sleepy) Also the cipro (antibiotic) We are culturing your urine  Labs today  Will get in contact soon to make additional plan based on results If worse contact us - or if worse after hours go to [facility]  Sip on fluids - this is most important

## 2011-08-11 NOTE — Assessment & Plan Note (Signed)
See assessment for abd pain  cx the urine - tx with cipro/ also phenergan for nausea

## 2011-08-11 NOTE — Progress Notes (Signed)
Subjective:    Patient ID: Courtney Roth, female    DOB: 03/24/1946, 65 y.o.   MRN: [id number]  HPI Pt here for acute visit for fever/ nausea / stomach pain Temp 99.2 now Wt is down 5 lb   Started getting sick on Friday night- felt hot and feverish  Pain in mid abdomen - epigastric area thru to her back - mostly in the flank area R sided now   Still has gallbladder and appendix and has never had a kidney stone  Nausea too- no vomiting , however  No stool changes at all  No blood in stool  No sick contracts   2 pc of toast in 3 days  No appetite Is drinking   Took aleve in the beginning  Took 4 pepto bismol chewables and it did not help  Last ate tortilla with [place] and swiss  No sick contacts at all   Patient Active Problem List  Diagnoses  . COLONIC POLYPS, HYPERPLASTIC  . VITAMIN B12 DEFICIENCY  . HYPERLIPIDEMIA  . OBESITY  . STRESS REACTION, ACUTE, WITH EMOTIONAL DISTURBANCE  . HYPERTENSION  . GERD  . COLITIS, ULCERATIVE  . COLITIS  . DIVERTICULITIS OF COLON  . PANCREATITIS  . BACK PAIN, LUMBAR  . EDEMA LEG  . DYSPNEA ON EXERTION  . PERSONAL HISTORY OF COLONIC POLYPS  . TOBACCO USE, QUIT  . Hyperglycemia  . Numbness in feet  . Rash and nonspecific skin eruption  . Abdominal pain  . Abnormal urinalysis   Past Medical History  Diagnosis Date  . GERD (gastroesophageal reflux disease)   . Hyperlipidemia   . Hypertension   . Obesity   . Ulcerative colitis   . Stress reaction   . Dyspnea   . Pneumonia    Past Surgical History  Procedure Date  . Ruptured tubal pregnancy 1975    1 ovary removed   History  Substance Use Topics  . Smoking status: Former Smoker    Quit date: 10/27/2006  . Smokeless tobacco: Not on file  . Alcohol Use:    Family History  Problem Relation Age of Onset  . Heart disease Mother   . Hypertension Mother   . GER disease Mother   . Osteoporosis Mother   . Heart disease Father   . Crohn's disease Sister     Allergies  Allergen Reactions  . Codeine   . Metoprolol Succinate     REACTION: sob, fatigue   Current Outpatient Prescriptions on File Prior to Visit  Medication Sig Dispense Refill  . aspirin 81 MG tablet Take 81 mg by mouth daily.        Marland Kitchen CALCIUM-VITAMIN D PO Take 1 tablet by mouth daily.        . Cholecalciferol (VITAMIN D3) 1000 UNITS CAPS Take 1 capsule by mouth 3 (three) times daily.        . hydrochlorothiazide 25 MG tablet Take 1 tablet (25 mg total) by mouth daily.  30 tablet  2  . LIALDA 1.2 G EC tablet TAKE 2 TABLETS BY MOUTH EVERY DAY  60 tablet  6  . lisinopril (PRINIVIL,ZESTRIL) 5 MG tablet Take 1 tablet (5 mg total) by mouth daily.  30 tablet  11  . MAGNESIUM ASPARTATE PO Take 1 tablet by mouth daily.        . naproxen sodium (ALEVE) 220 MG tablet Take 220 mg by mouth 3 (three) times daily as needed.        . simvastatin (ZOCOR) 20  MG tablet Take 20 mg by mouth at bedtime.        . cyclobenzaprine (FLEXERIL) 10 MG tablet Take 5-10 mg by mouth 3 (three) times daily as needed. For back pain. Watch out for sedation.       . fluticasone (FLONASE) 50 MCG/ACT nasal spray Place 2 sprays into the nose daily as needed.        . Omega-3 Fatty Acids (FISH OIL) 1000 MG CAPS Take 1 capsule by mouth 3 (three) times daily.        Marland Kitchen terconazole (TERAZOL 7) 0.4 % vaginal cream Apply to affected areas once daily for 7 days  45 g  0      Review of Systems Review of Systems  Constitutional: Negative for  unexpected weight change.(eating less) , pos for fatigue/ low grade fever and malaise   Eyes: Negative for pain and visual disturbance.  Respiratory: Negative for cough and shortness of breath.   Cardiovascular: Negative for cp or palpitations    Gastrointestinal: Negative for , diarrhea and constipation, and blood in stool, pos for abd pain, nausea (no vomiting) Genitourinary: Negative for urgency and frequency.  Skin: Negative for pallor or rash   Neurological: Negative for  weakness, light-headedness, numbness and headaches.  Hematological: Negative for adenopathy. Does not bruise/bleed easily.  Psychiatric/Behavioral: Negative for dysphoric mood. The patient is not nervous/anxious.         Objective:   Physical Exam  Constitutional: She appears well-developed and well-nourished.       overwt and fatigued appearing   HENT:  Head: Normocephalic and atraumatic.  Mouth/Throat: Oropharynx is clear and moist.  Eyes: Conjunctivae and EOM are normal. Pupils are equal, round, and reactive to light. Right eye exhibits no discharge. Left eye exhibits no discharge. No scleral icterus.  Neck: Normal range of motion. Neck supple.  Cardiovascular: Normal rate, regular rhythm and normal heart sounds.   Pulmonary/Chest: Breath sounds normal. No respiratory distress. She has no wheezes.  Abdominal: Soft. Bowel sounds are normal. She exhibits no distension and no mass. There is tenderness. There is no rebound and no guarding.       Tenderness is mostly in epigastric area with some ext to RUQ No cva tenderness  No rash  Neg murphy sign No lower abd tenderness   Musculoskeletal: She exhibits no edema.       Mild R cva tenderness  Neurological: She is alert. She has normal reflexes. Coordination normal.  Skin: Skin is warm and dry. No rash noted. No erythema. No pallor.  Psychiatric: She has a normal mood and affect.       Nl affect but is generally fatigued           Assessment & Plan:

## 2011-08-12 ENCOUNTER — Telehealth: Payer: Self-pay | Admitting: Family Medicine

## 2011-08-12 MED ORDER — PROMETHAZINE HCL 25 MG RE SUPP: 25.0000 mg | Freq: Four times a day (QID) | RECTAL | Status: AC | PRN

## 2011-08-12 NOTE — Telephone Encounter (Signed)
Message copied by Abner Greenspan on Tue Aug 12, 2011  5:53 PM ------      Message from: Royann Shivers A      Created: Tue Aug 12, 2011  2:21 PM       Spoke with patient. She does request the suppository. She will call with updates and will await the culture results.

## 2011-08-12 NOTE — Telephone Encounter (Signed)
Px written for call in   

## 2011-08-12 NOTE — Telephone Encounter (Signed)
Medication phoned to [facility] as instructed. Left v/m for pt to ck with pharmacy due to time of day.

## 2011-08-13 NOTE — Telephone Encounter (Signed)
Patient notified as instructed by telephone. Pt said she is feeling better, no abdominal pain. Slight nausea but she is able to eat and drink this AM and temp this AM is 98.2 oral. Pt feels weak but she said she had been in bed and not eating for 5 days so she expects weakness to also get better. Pt will call if symptoms return or further problem.

## 2011-08-13 NOTE — Telephone Encounter (Signed)
Patient notified as instructed by telephone.

## 2011-08-13 NOTE — Telephone Encounter (Signed)
Thanks - since feeling so much better - I will not do any further dx tests unless symptoms worsen

## 2011-08-24 ENCOUNTER — Other Ambulatory Visit: Payer: Self-pay | Admitting: Family Medicine

## 2011-10-30 ENCOUNTER — Telehealth: Payer: Self-pay | Admitting: Gastroenterology

## 2011-10-30 NOTE — Telephone Encounter (Signed)
Pt states she has lost her insurance and cannot afford the Lialda. Pt would like to be placed back on Sulfasalazine that she has taken before. She states this is less expensive. Dr. Carlean Purl as doc of the day please advise. Pt currently takes Lialda 1.2gm 2 tablets daily.

## 2011-10-31 MED ORDER — MESALAMINE 1.2 G PO TBEC: 2400.0000 mg | DELAYED_RELEASE_TABLET | Freq: Every day | ORAL | Status: DC

## 2011-10-31 NOTE — Telephone Encounter (Signed)
Left message for pt regarding Dr. Celesta Aver recommendations and to call back to schedule an appt with Dr. Deatra Ina  Pt called back and scheduled an appt to see Dr. Deatra Ina 11/05/11@11 :45am. Pt states she does not have a ride up here to get the samples. Pt requests that we [facility] in [facility] [place] [facility] she can see Dr. Deatra Ina on 11/05/11.

## 2011-10-31 NOTE — Telephone Encounter (Signed)
This may be appropriate but she is overdue for follow-up and possibly colonoscopy so she should have an REV (presumably with Dr. Deatra Ina since he is returning soon.  We could give her some Lialda samples til visit is accomplished.

## 2011-11-05 ENCOUNTER — Encounter: Payer: Self-pay | Admitting: Gastroenterology

## 2011-11-05 ENCOUNTER — Ambulatory Visit (INDEPENDENT_AMBULATORY_CARE_PROVIDER_SITE_OTHER): Payer: Medicare Other | Admitting: Gastroenterology

## 2011-11-05 VITALS — BP 114/74 | HR 96 | Ht 59.5 in | Wt 223.4 lb

## 2011-11-05 DIAGNOSIS — K5190 Ulcerative colitis, unspecified, without complications: Secondary | ICD-10-CM

## 2011-11-05 MED ORDER — SULFASALAZINE 500 MG PO TABS: 1000.0000 mg | ORAL_TABLET | Freq: Two times a day (BID) | ORAL | Status: DC

## 2011-11-05 NOTE — Assessment & Plan Note (Addendum)
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

## 2011-11-05 NOTE — Patient Instructions (Signed)
Call back to schedule you colonoscopy and pre visit.

## 2011-11-05 NOTE — Progress Notes (Signed)
History of Present Illness:  Mrs. Mahurin has returned for followup of her proctitis. She is symptom-free on lialda. Her main complaint is the cost of the medication. One year ago a sessile serrated adenoma was removed from the right colon. Mild proctitis was seen.    Review of Systems: She has a pinched nerve in her back and is complaining of pain secondary to this Pertinent positive and negative review of systems were noted in the above HPI section. All other review of systems were otherwise negative.    Current Medications, Allergies, Past Medical History, Past Surgical History, Family History and Social History were reviewed in [facility] record  Vital signs were reviewed in today's medical record. Physical Exam: General: Well developed , well nourished, no acute distress Head: Normocephalic and atraumatic Eyes:  sclerae anicteric, EOMI Ears: Normal auditory acuity Mouth: No deformity or lesions Lungs: Clear throughout to auscultation Heart: Regular rate and rhythm; no murmurs, rubs or bruits Abdomen: Soft, non tender and non distended. No masses, hepatosplenomegaly or hernias noted. Normal Bowel sounds Rectal:deferred Musculoskeletal: Symmetrical with no gross deformities  Pulses:  Normal pulses noted Extremities: No clubbing, cyanosis, edema or deformities noted Neurological: Alert oriented x 4, grossly nonfocal Psychological:  Alert and cooperative. Normal mood and affect

## 2011-12-12 ENCOUNTER — Encounter: Payer: Self-pay | Admitting: Family Medicine

## 2011-12-12 ENCOUNTER — Ambulatory Visit (INDEPENDENT_AMBULATORY_CARE_PROVIDER_SITE_OTHER): Payer: Medicare Other | Admitting: Family Medicine

## 2011-12-12 VITALS — BP 108/72 | HR 100 | Temp 98.8°F | Ht 59.5 in | Wt 221.8 lb

## 2011-12-12 DIAGNOSIS — R1011 Right upper quadrant pain: Secondary | ICD-10-CM | POA: Diagnosis not present

## 2011-12-12 DIAGNOSIS — R109 Unspecified abdominal pain: Secondary | ICD-10-CM | POA: Diagnosis not present

## 2011-12-12 LAB — POCT URINALYSIS DIPSTICK
Bilirubin, UA: NEGATIVE
Glucose, UA: NEGATIVE
Nitrite, UA: NEGATIVE
pH, UA: 7

## 2011-12-12 LAB — COMPREHENSIVE METABOLIC PANEL
AST: 19 U/L (ref 0–37)
Albumin: 4.3 g/dL (ref 3.5–5.2)
Alkaline Phosphatase: 69 U/L (ref 39–117)
Calcium: 9.4 mg/dL (ref 8.4–10.5)
Chloride: 98 mEq/L (ref 96–112)
Glucose, Bld: 104 mg/dL — ABNORMAL HIGH (ref 70–99)
Potassium: 3.7 mEq/L (ref 3.5–5.3)
Sodium: 137 mEq/L (ref 135–145)
Total Protein: 7.2 g/dL (ref 6.0–8.3)

## 2011-12-12 LAB — CBC WITH DIFFERENTIAL/PLATELET
Basophils Relative: 0 % (ref 0–1)
Hemoglobin: 14.6 g/dL (ref 12.0–15.0)
Lymphocytes Relative: 22 % (ref 12–46)
MCHC: 33.8 g/dL (ref 30.0–36.0)
Monocytes Relative: 9 % (ref 3–12)
Neutro Abs: 7.6 10*3/uL (ref 1.7–7.7)
Neutrophils Relative %: 68 % (ref 43–77)
RBC: 4.74 MIL/uL (ref 3.87–5.11)
WBC: 11.2 10*3/uL — ABNORMAL HIGH (ref 4.0–10.5)

## 2011-12-12 LAB — AMYLASE: Amylase: 56 U/L (ref 0–105)

## 2011-12-12 LAB — POCT UA - MICROSCOPIC ONLY
RBC, urine, microscopic: 0
WBC, Ur, HPF, POC: 0
Yeast, UA: 0

## 2011-12-12 NOTE — Assessment & Plan Note (Signed)
Rad from abd No cva tenderness or skin change  ua neg [place] ordered - see assessment for abd pain

## 2011-12-12 NOTE — Patient Instructions (Signed)
Eat a bland diet- no fatty foods  If abdominal pain worsens- call  Lab today We will schedule ultrasound at check out

## 2011-12-12 NOTE — Progress Notes (Signed)
Subjective:    Patient ID: Courtney Roth, female    DOB: Dec 30, 1945, 66 y.o.   MRN: [id number]  HPI Here for R upper abdominal and flank pain  Going on for 3 days  Pain is sharp at times and more or less constant  If lying down - is on and off   No fever, no n/v Has felt a bit chilled  Eating makes no difference- but no appetite   Still has a gallbladder Had pancreatitis once ?  That or ulcerative colitis on her CT Does not drink alcohol   Drinking water to give a specimen No urinary symptoms  Patient Active Problem List  Diagnoses  . VITAMIN B12 DEFICIENCY  . HYPERLIPIDEMIA  . OBESITY  . STRESS REACTION, ACUTE, WITH EMOTIONAL DISTURBANCE  . HYPERTENSION  . GERD  . COLITIS, ULCERATIVE  . COLITIS  . DIVERTICULITIS OF COLON  . PANCREATITIS  . BACK PAIN, LUMBAR  . EDEMA LEG  . DYSPNEA ON EXERTION  . PERSONAL HISTORY OF COLONIC POLYPS  . TOBACCO USE, QUIT  . Hyperglycemia  . Numbness in feet  . Rash and nonspecific skin eruption  . Abdominal pain  . Abnormal urinalysis   Past Medical History  Diagnosis Date  . GERD (gastroesophageal reflux disease)   . Hyperlipidemia   . Hypertension   . Obesity   . Ulcerative colitis   . Stress reaction   . Dyspnea   . Pneumonia   . Colon polyps 03/04/2007    History of Hyperplastic   Past Surgical History  Procedure Date  . Ruptured tubal pregnancy 1975    1 ovary removed   History  Substance Use Topics  . Smoking status: Former Smoker    Quit date: 10/27/2006  . Smokeless tobacco: Never Used  . Alcohol Use: Yes   Family History  Problem Relation Age of Onset  . Heart disease Mother   . Hypertension Mother   . GER disease Mother   . Osteoporosis Mother   . Heart disease Father   . Crohn's disease Sister   . Colon cancer Neg Hx   . Breast cancer Maternal Aunt   . Breast cancer Cousin   . Clotting disorder Mother   . Diabetes Paternal Uncle    Allergies  Allergen Reactions  . Codeine   .  Metoprolol Succinate     REACTION: sob, fatigue   Current Outpatient Prescriptions on File Prior to Visit  Medication Sig Dispense Refill  . aspirin 81 MG tablet Take 81 mg by mouth daily.        Marland Kitchen CALCIUM-VITAMIN D PO Take 1 tablet by mouth daily.        . Cholecalciferol (VITAMIN D3) 1000 UNITS CAPS Take 1 capsule by mouth 3 (three) times daily.        . cyclobenzaprine (FLEXERIL) 10 MG tablet Take 5-10 mg by mouth 3 (three) times daily as needed. For back pain. Watch out for sedation.       . hydrochlorothiazide (HYDRODIURIL) 25 MG tablet TAKE 1 TABLET EVERY DAY  30 tablet  2  . lisinopril (PRINIVIL,ZESTRIL) 5 MG tablet Take 1 tablet (5 mg total) by mouth daily.  30 tablet  11  . MAGNESIUM ASPARTATE PO Take 1 tablet by mouth daily.        . naproxen sodium (ALEVE) 220 MG tablet Take 220 mg by mouth 3 (three) times daily as needed.        . simvastatin (ZOCOR) 20 MG tablet Take 20  mg by mouth at bedtime.        . sulfaSALAzine (AZULFIDINE) 500 MG tablet Take 2 tablets (1,000 mg total) by mouth 2 (two) times daily.  60 tablet  12  . mesalamine (LIALDA) 1.2 G EC tablet Take 2 tablets (2.4 g total) by mouth daily.  10 tablet  0      Review of Systems Review of Systems  Constitutional: Negative for fever, fatigue and unexpected weight change.  Eyes: Negative for pain and visual disturbance.  Respiratory: Negative for cough and shortness of breath.   Cardiovascular: Negative for cp or palpitations    Gastrointestinal: Negative for nausea, diarrhea and constipation. pos for abdominal pain/ loss of appetite No vomiting or hematemesis, no blood in stool Genitourinary: Negative for urgency and frequency. Neg for blood in urine  Skin: Negative for pallor or rash   Neurological: Negative for weakness, light-headedness, numbness and headaches.  Hematological: Negative for adenopathy. Does not bruise/bleed easily.  Psychiatric/Behavioral: Negative for dysphoric mood. The patient is not  nervous/anxious.          Objective:   Physical Exam  Constitutional: She appears well-developed and well-nourished. No distress.       Obese and overall well appearing  Not in distress   HENT:  Head: Normocephalic and atraumatic.  Mouth/Throat: No oropharyngeal exudate.  Eyes: Conjunctivae and EOM are normal. Pupils are equal, round, and reactive to light. No scleral icterus.  Neck: Normal range of motion. Neck supple. No JVD present. Carotid bruit is not present. No thyromegaly present.  Cardiovascular: Normal rate, regular rhythm, normal heart sounds and intact distal pulses.  Exam reveals no gallop.   Pulmonary/Chest: Effort normal and breath sounds normal. No respiratory distress. She has no wheezes.  Abdominal: Soft. Bowel sounds are normal. She exhibits no distension, no pulsatile liver, no abdominal bruit, no ascites and no mass. There is no hepatosplenomegaly. There is tenderness in the right upper quadrant. There is positive Murphy's sign. There is no rigidity, no rebound, no guarding, no CVA tenderness and no tenderness at McBurney's point. No hernia.  Musculoskeletal: Normal range of motion. She exhibits no edema.  Lymphadenopathy:    She has no cervical adenopathy.  Neurological: She is alert. She has normal reflexes.  Skin: Skin is dry. No rash noted. No erythema. No pallor.       No jaundice   Psychiatric: She has a normal mood and affect.          Assessment & Plan:

## 2011-12-12 NOTE — Assessment & Plan Note (Signed)
With pos murphy sign  Pt has hx of Inflam BD and ? Remote hx of pancreatitis Lab today abd [place] - ? Gallstones Update if not starting to improve in a week or if worsening  ua neg today

## 2011-12-15 ENCOUNTER — Other Ambulatory Visit: Payer: BC Managed Care – PPO

## 2011-12-15 ENCOUNTER — Telehealth: Payer: Self-pay | Admitting: Family Medicine

## 2011-12-15 NOTE — Telephone Encounter (Signed)
Thanks for the update - ask her to let me know if symptoms resume

## 2011-12-15 NOTE — Telephone Encounter (Signed)
Called the patient because she was set up for the Abd/US at [facility] for today, 12/15/11. She then rescheduled to the 02/19th. Then she called them back and cancelled altogether. She said her pain went completely away and didn't want to have the [place] any longer.  Wanted to let you know that she cancelled.

## 2011-12-15 NOTE — Telephone Encounter (Signed)
Left vm for pt to callback 

## 2011-12-15 NOTE — Telephone Encounter (Signed)
Contacted patient and notified of recommendations.  Patient will call back when needed.

## 2011-12-16 ENCOUNTER — Other Ambulatory Visit: Payer: BC Managed Care – PPO

## 2011-12-17 ENCOUNTER — Telehealth: Payer: Self-pay

## 2011-12-17 NOTE — Telephone Encounter (Signed)
I would like to check cbc in a month - have her come back to f/u with me and we will do labs at that visit / I also want to examine her abdomen again Did the form - please copy to scan in chart also  If wrist is swollen/ red let me know  If not imp in a week let me know Ok to use warm compresses Let whoever drew her blood know also

## 2011-12-17 NOTE — Telephone Encounter (Signed)
Patient notified as instructed by telephone. Pt scheduled appt with Dr Glori Bickers 01/14/12 at 11:30am. Pt said she is having no swelling or redness in the left wrist. Gave Terri copy of this note.

## 2011-12-17 NOTE — Telephone Encounter (Signed)
I called pt to ask questions relating to the [facility] disability form and pt  Wanted to know if needed to repeat blood work since WBC was elevated on 12/12/11 specimen. Pt also said when had blood drawn thinks nerve was hit. Pt still having pain in the left wrist ( blood drawn on 12/12/11 in left arm). Pt said pain level now is 6 - 7. Pt wants to know how long  It should take for wrist to stop hurting. Pt can be reached at [phone]. [facility] needed. Please advise.

## 2011-12-19 ENCOUNTER — Other Ambulatory Visit: Payer: Self-pay

## 2011-12-19 MED ORDER — SIMVASTATIN 20 MG PO TABS: 20.0000 mg | ORAL_TABLET | Freq: Every day | ORAL | Status: DC

## 2011-12-19 NOTE — Telephone Encounter (Signed)
[facility] request Simvastatin 20 mg #30 x 11.

## 2011-12-24 ENCOUNTER — Telehealth: Payer: Self-pay

## 2011-12-24 NOTE — Telephone Encounter (Signed)
Following up on patient's concern.

## 2011-12-24 NOTE — Telephone Encounter (Signed)
Please follow up with first available so we can take a look at it  Make [facility] aware - pt thinks she was injured from her blood draw here

## 2011-12-24 NOTE — Telephone Encounter (Signed)
Patient notified as instructed by telephone. Pt scheduled appt with Dr Glori Bickers 12/26/11 at 10am. Pt instructed if symptoms change and need to be seen sooner to please call back. Pt said she thought Friday would be fine. I am forwarding this note to [place] also.

## 2011-12-24 NOTE — Telephone Encounter (Signed)
Pt calling back as f/u how left arm is doing from blood draw on 12/12/11. Pt said there is no redness or swelling but the pain is still the same. Pt said worse upon movement but pain goes from bend of arm to wrist. Pt said sometimes when not moving the arm the wrist still throbs. Pt said warm compresses did not help. [facility] if [facility] needed. Please advise.

## 2011-12-26 ENCOUNTER — Encounter: Payer: Self-pay | Admitting: Family Medicine

## 2011-12-26 ENCOUNTER — Ambulatory Visit (INDEPENDENT_AMBULATORY_CARE_PROVIDER_SITE_OTHER): Payer: Medicare Other | Admitting: Family Medicine

## 2011-12-26 VITALS — BP 120/70 | HR 96 | Temp 98.6°F | Ht 59.5 in | Wt 226.2 lb

## 2011-12-26 DIAGNOSIS — M79609 Pain in unspecified limb: Secondary | ICD-10-CM

## 2011-12-26 DIAGNOSIS — M79603 Pain in arm, unspecified: Secondary | ICD-10-CM | POA: Insufficient documentation

## 2011-12-26 MED ORDER — GABAPENTIN 100 MG PO CAPS: 100.0000 mg | ORAL_CAPSULE | Freq: Two times a day (BID) | ORAL | Status: DC

## 2011-12-26 NOTE — Patient Instructions (Signed)
Start the gabapentin for arm pain  It will sedate initially and then improve Update me in a week with how you are doing We will do neurology referral at check out  Talk to [facility] or Caren Griffins on way out - to discuss what happened with blood draw

## 2011-12-26 NOTE — Assessment & Plan Note (Signed)
Sharp and neurologic type pain that occurred after blood draw in radial distribution Will begin tx with gabapentin 100 bid and work up as tolerated Reassuring exam Ref to her neurologist

## 2011-12-26 NOTE — Progress Notes (Signed)
Subjective:    Patient ID: Courtney Roth, female    DOB: [birth year], 66 y.o.   MRN: [id number]  HPI Had blood drawn with Terri at last visit   L arm Immediately had severe pain-- with electric shock pain - going down her arm  It was very bad  Never got swollen or red  Still hurts a lot and pain is not decreasing at all -- (electric shocky pain when she moves her arm ) And it burns over lateral wrist - also over lateral thumb  It is affecting her grip -- due to pain  Cannot lift - too painful   Is R handed   ? Getting a cold - st last night -post nasal drip   Patient Active Problem List  Diagnoses  . VITAMIN B12 DEFICIENCY  . HYPERLIPIDEMIA  . OBESITY  . STRESS REACTION, ACUTE, WITH EMOTIONAL DISTURBANCE  . HYPERTENSION  . GERD  . COLITIS, ULCERATIVE  . COLITIS  . DIVERTICULITIS OF COLON  . PANCREATITIS  . BACK PAIN, LUMBAR  . EDEMA LEG  . DYSPNEA ON EXERTION  . PERSONAL HISTORY OF COLONIC POLYPS  . TOBACCO USE, QUIT  . Hyperglycemia  . Numbness in feet  . Rash and nonspecific skin eruption  . Abdominal pain  . Abnormal urinalysis  . Right upper quadrant abdominal pain  . Right flank pain  . Arm pain, anterior   Past Medical History  Diagnosis Date  . GERD (gastroesophageal reflux disease)   . Hyperlipidemia   . Hypertension   . Obesity   . Ulcerative colitis   . Stress reaction   . Dyspnea   . Pneumonia   . Colon polyps 03/04/2007    History of Hyperplastic   Past Surgical History  Procedure Date  . Ruptured tubal pregnancy 1975    1 ovary removed   History  Substance Use Topics  . Smoking status: Former Smoker    Quit date: 10/27/2006  . Smokeless tobacco: Never Used  . Alcohol Use: Yes   Family History  Problem Relation Age of Onset  . Heart disease Mother   . Hypertension Mother   . GER disease Mother   . Osteoporosis Mother   . Heart disease Father   . Crohn's disease Sister   . Colon cancer Neg Hx   . Breast cancer Maternal Aunt    . Breast cancer Cousin   . Clotting disorder Mother   . Diabetes Paternal Uncle    Allergies  Allergen Reactions  . Codeine   . Metoprolol Succinate     REACTION: sob, fatigue   Current Outpatient Prescriptions on File Prior to Visit  Medication Sig Dispense Refill  . aspirin 81 MG tablet Take 81 mg by mouth daily.        Marland Kitchen CALCIUM-VITAMIN D PO Take 1 tablet by mouth daily.        . Cholecalciferol (VITAMIN D3) 1000 UNITS CAPS Take 1 capsule by mouth daily.       . hydrochlorothiazide (HYDRODIURIL) 25 MG tablet TAKE 1 TABLET EVERY DAY  30 tablet  2  . lisinopril (PRINIVIL,ZESTRIL) 5 MG tablet Take 1 tablet (5 mg total) by mouth daily.  30 tablet  11  . MAGNESIUM ASPARTATE PO Take 1 tablet by mouth daily.        . simvastatin (ZOCOR) 20 MG tablet Take 1 tablet (20 mg total) by mouth at bedtime.  30 tablet  11  . sulfaSALAzine (AZULFIDINE) 500 MG tablet Take 2 tablets (1,000  mg total) by mouth 2 (two) times daily.  60 tablet  12  . cyclobenzaprine (FLEXERIL) 10 MG tablet Take 5-10 mg by mouth 3 (three) times daily as needed. For back pain. Watch out for sedation.       . mesalamine (LIALDA) 1.2 G EC tablet Take 2 tablets (2.4 g total) by mouth daily.  10 tablet  0  . naproxen sodium (ALEVE) 220 MG tablet Take 220 mg by mouth 3 (three) times daily as needed.              Review of Systems Review of Systems  Constitutional: Negative for fever, appetite change, fatigue and unexpected weight change.  Eyes: Negative for pain and visual disturbance.  Respiratory: Negative for cough and shortness of breath.   Cardiovascular: Negative for cp or palpitations    Gastrointestinal: Negative for nausea, diarrhea and constipation.  Genitourinary: Negative for urgency and frequency.  Skin: Negative for pallor or rash   MSK pos for L arm pain  Neurological: Negative for weakness, light-headedness, numbness and headaches. pos for tingling in L wrist and arm Hematological: Negative for  adenopathy. Does not bruise/bleed easily.  Psychiatric/Behavioral: Negative for dysphoric mood. The patient is not nervous/anxious.          Objective:   Physical Exam  Constitutional: She appears well-developed and well-nourished. No distress.       Obese and well app  HENT:  Head: Normocephalic and atraumatic.  Eyes: Conjunctivae and EOM are normal. Pupils are equal, round, and reactive to light.  Neck: Normal range of motion. Neck supple.  Cardiovascular: Normal rate and regular rhythm.   Musculoskeletal: She exhibits tenderness. She exhibits no edema.       L lower arm and wrist are diffusely tender in all areas Hypersensitive to soft touch- feeling as pain  Nl perfusion  Cannot grip or abduct arm without significant pain  Lymphadenopathy:    She has no cervical adenopathy.  Neurological: She is alert. She has normal reflexes. She displays no atrophy and no tremor. No cranial nerve deficit. She exhibits normal muscle tone. Coordination normal.       hypersensitve L lower arm  Skin: Skin is warm and dry. No rash noted. No erythema. No pallor.  Psychiatric: She has a normal mood and affect.       Pt is generally uncomfortable but mood is ok          Assessment & Plan:

## 2011-12-29 ENCOUNTER — Telehealth: Payer: Self-pay | Admitting: Family Medicine

## 2011-12-29 NOTE — Telephone Encounter (Signed)
Called patient for follow up and she stated that her arm was "about the same" and that she would be following up with the neurologist appointment. I asked her if she would keep [place] updated and let us know how she was doing.

## 2011-12-30 DIAGNOSIS — G5710 Meralgia paresthetica, unspecified lower limb: Secondary | ICD-10-CM | POA: Diagnosis not present

## 2012-01-14 ENCOUNTER — Ambulatory Visit: Payer: BC Managed Care – PPO | Admitting: Family Medicine

## 2012-01-15 ENCOUNTER — Ambulatory Visit: Payer: BC Managed Care – PPO | Admitting: Family Medicine

## 2012-01-15 ENCOUNTER — Encounter: Payer: Self-pay | Admitting: Family Medicine

## 2012-01-15 ENCOUNTER — Ambulatory Visit (INDEPENDENT_AMBULATORY_CARE_PROVIDER_SITE_OTHER): Payer: Medicare Other | Admitting: Family Medicine

## 2012-01-15 VITALS — BP 110/70 | HR 68 | Temp 98.2°F | Ht 59.5 in

## 2012-01-15 DIAGNOSIS — F438 Other reactions to severe stress: Secondary | ICD-10-CM

## 2012-01-15 DIAGNOSIS — R4589 Other symptoms and signs involving emotional state: Secondary | ICD-10-CM | POA: Diagnosis not present

## 2012-01-15 DIAGNOSIS — F430 Acute stress reaction: Secondary | ICD-10-CM | POA: Insufficient documentation

## 2012-01-15 DIAGNOSIS — R109 Unspecified abdominal pain: Secondary | ICD-10-CM | POA: Diagnosis not present

## 2012-01-15 DIAGNOSIS — M79609 Pain in unspecified limb: Secondary | ICD-10-CM | POA: Diagnosis not present

## 2012-01-15 DIAGNOSIS — F4389 Other reactions to severe stress: Secondary | ICD-10-CM

## 2012-01-15 DIAGNOSIS — M79603 Pain in arm, unspecified: Secondary | ICD-10-CM

## 2012-01-15 LAB — CBC WITH DIFFERENTIAL/PLATELET
Basophils Absolute: 0 10*3/uL (ref 0.0–0.1)
HCT: 42.1 % (ref 36.0–46.0)
Lymphs Abs: 2.7 10*3/uL (ref 0.7–4.0)
MCV: 93.6 fl (ref 78.0–100.0)
Monocytes Absolute: 0.8 10*3/uL (ref 0.1–1.0)
Platelets: 337 10*3/uL (ref 150.0–400.0)
RDW: 15.6 % — ABNORMAL HIGH (ref 11.5–14.6)

## 2012-01-15 NOTE — Assessment & Plan Note (Signed)
From marriage problems causing low self esteem Disc situation and symptoms in detail  I feel counseling would be helpful (individual since spouse refuses) Pt declines but I urged her to think about it  Disc other support sources
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
## 2012-01-15 NOTE — Assessment & Plan Note (Signed)
This RUQ pain is better now - did cancel [place] at pt's request  Wbc was slt high -so that needs to be re checked  Disc healthy diet Update if symptoms return

## 2012-01-15 NOTE — Patient Instructions (Addendum)
I'm glad your abdominal pain is better  I do think that you would benefit from counselor in the future- I feel it would help -just let me know Talk to friends and family/ write in a journal , and get out and exercise when you can Re checking blood count today  Please send for last note from [facility] neurology  Talk to [place] on the way out about bills
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
## 2012-01-21 NOTE — Telephone Encounter (Signed)
Forwarded for follow up and called patient back with contact information for her questions.

## 2012-02-10 ENCOUNTER — Other Ambulatory Visit: Payer: Self-pay | Admitting: *Deleted

## 2012-02-10 MED ORDER — HYDROCHLOROTHIAZIDE 25 MG PO TABS: 25.0000 mg | ORAL_TABLET | Freq: Every day | ORAL | Status: DC

## 2012-04-15 ENCOUNTER — Other Ambulatory Visit: Payer: Self-pay

## 2012-04-15 MED ORDER — LISINOPRIL 5 MG PO TABS: 5.0000 mg | ORAL_TABLET | Freq: Every day | ORAL | Status: DC

## 2012-04-15 NOTE — Telephone Encounter (Signed)
Pt request refill lisinopril 5 mg # 30 x 11. Pt will ck with [facility] pharmacy to pick up med.

## 2012-05-07 ENCOUNTER — Other Ambulatory Visit: Payer: Self-pay

## 2012-05-07 NOTE — Telephone Encounter (Signed)
Pt got lisinopril and cannot find rx. [facility] said could not fill. I called [facility], spoke with Santiago Glad and pt can pick up rx tomorrow and ins. Will pay. Pt said she has enough for 2 days will pick up tomorrow.

## 2012-07-12 ENCOUNTER — Encounter: Payer: Self-pay | Admitting: Gastroenterology

## 2012-07-17 ENCOUNTER — Other Ambulatory Visit: Payer: Self-pay | Admitting: Family Medicine

## 2012-12-03 ENCOUNTER — Other Ambulatory Visit: Payer: Self-pay | Admitting: *Deleted

## 2012-12-03 MED ORDER — HYDROCHLOROTHIAZIDE 25 MG PO TABS: 25.0000 mg | ORAL_TABLET | Freq: Every day | ORAL | Status: DC

## 2012-12-14 ENCOUNTER — Other Ambulatory Visit: Payer: Self-pay | Admitting: Gastroenterology

## 2012-12-15 ENCOUNTER — Other Ambulatory Visit: Payer: Self-pay | Admitting: *Deleted

## 2012-12-15 MED ORDER — SIMVASTATIN 20 MG PO TABS: 20.0000 mg | ORAL_TABLET | Freq: Every day | ORAL | Status: DC

## 2013-01-31 ENCOUNTER — Ambulatory Visit (INDEPENDENT_AMBULATORY_CARE_PROVIDER_SITE_OTHER): Payer: Medicare Other | Admitting: Family Medicine

## 2013-01-31 ENCOUNTER — Encounter: Payer: Self-pay | Admitting: Family Medicine

## 2013-01-31 VITALS — BP 110/80 | HR 81 | Temp 98.4°F | Ht 59.5 in

## 2013-01-31 DIAGNOSIS — M545 Low back pain: Secondary | ICD-10-CM

## 2013-01-31 DIAGNOSIS — R21 Rash and other nonspecific skin eruption: Secondary | ICD-10-CM

## 2013-01-31 DIAGNOSIS — M79605 Pain in left leg: Secondary | ICD-10-CM

## 2013-01-31 NOTE — Patient Instructions (Addendum)
See Courtney Roth at check out for referral for MRI  For elbow skin condition - keep using the foot cream on it and also try some cort aid over the counter 2-3 times per week  Keep moving - see if you can use your exercise bike without hurting your back

## 2013-01-31 NOTE — Progress Notes (Signed)
Subjective:    Patient ID: Courtney Roth, female    DOB: 05/13/1946, 67 y.o.   MRN: [id number]  HPI Here for generalized body pain including back/wrist/ ankle / elbow   Has hx of lumbar spinal stenosis - has seen Dr Tamala Julian in the past  She thinks this all started when she fall off a chair  Never did have an MRI - thinks she has a pinched nerve  Hurts worst to walk and also standing without moving or prolonged sitting  Some relief when she lies down  Dr Manuella Ghazi was the last one who saw her last  Pain shoots down both legs   Still having problems with her wrist - after her blood draw - still bothering her - "can't do anything with it" Neurologist was not able to help  Ankle started to hurt several months ago-- L ankle - hurts to walk and she occasionally has sharp fleeting pains in it  Unsure if this could come from her back  Has skin issues with her elbow - L side - developed a hard dry spot  Used some expensive cream- just made it worse (? 2010 was amylactin)  Has taken aleve as needed   Patient Active Problem List  Diagnosis  . VITAMIN B12 DEFICIENCY  . HYPERLIPIDEMIA  . OBESITY  . STRESS REACTION, ACUTE, WITH EMOTIONAL DISTURBANCE  . HYPERTENSION  . GERD  . COLITIS, ULCERATIVE  . COLITIS  . DIVERTICULITIS OF COLON  . PANCREATITIS  . BACK PAIN, LUMBAR  . EDEMA LEG  . DYSPNEA ON EXERTION  . PERSONAL HISTORY OF COLONIC POLYPS  . TOBACCO USE, QUIT  . Hyperglycemia  . Numbness in feet  . Rash and nonspecific skin eruption  . Arm pain, anterior  . Stress reaction   Past Medical History  Diagnosis Date  . GERD (gastroesophageal reflux disease)   . Hyperlipidemia   . Hypertension   . Obesity   . Ulcerative colitis   . Stress reaction   . Dyspnea   . Pneumonia   . Colon polyps 03/04/2007    History of Hyperplastic   Past Surgical History  Procedure Laterality Date  . Ruptured tubal pregnancy  1975    1 ovary removed   History  Substance Use Topics  .  Smoking status: Former Smoker    Quit date: 10/27/2006  . Smokeless tobacco: Never Used  . Alcohol Use: Yes   Family History  Problem Relation Age of Onset  . Heart disease Mother   . Hypertension Mother   . GER disease Mother   . Osteoporosis Mother   . Heart disease Father   . Crohn's disease Sister   . Colon cancer Neg Hx   . Breast cancer Maternal Aunt   . Breast cancer Cousin   . Clotting disorder Mother   . Diabetes Paternal Uncle    Allergies  Allergen Reactions  . Codeine   . Metoprolol Succinate     REACTION: sob, fatigue   Current Outpatient Prescriptions on File Prior to Visit  Medication Sig Dispense Refill  . aspirin 81 MG tablet Take 81 mg by mouth daily.        Marland Kitchen CALCIUM-VITAMIN D PO Take 1 tablet by mouth daily.        . Cholecalciferol (VITAMIN D3) 1000 UNITS CAPS Take 1 capsule by mouth daily.       Marland Kitchen gabapentin (NEURONTIN) 100 MG capsule Take 1 capsule (100 mg total) by mouth 2 (two) times daily.  [place]  capsule  2  . hydrochlorothiazide (HYDRODIURIL) 25 MG tablet Take 1 tablet (25 mg total) by mouth daily. * Needs to schedule an appointment before the end of these refills*  30 tablet  4  . lisinopril (PRINIVIL,ZESTRIL) 5 MG tablet Take 1 tablet (5 mg total) by mouth daily.  30 tablet  11  . MAGNESIUM ASPARTATE PO Take 1 tablet by mouth daily.        . naproxen sodium (ALEVE) 220 MG tablet Take 220 mg by mouth 3 (three) times daily as needed.        . simvastatin (ZOCOR) 20 MG tablet Take 1 tablet (20 mg total) by mouth at bedtime.  30 tablet  4  . sulfaSALAzine (AZULFIDINE) 500 MG tablet TAKE 2 TABLETS TWICE A DAY  120 tablet  11  . [DISCONTINUED] mesalamine (LIALDA) 1.2 G EC tablet Take 2 tablets (2.4 g total) by mouth daily.  10 tablet  0   No current facility-administered medications on file prior to visit.    Review of Systems Review of Systems  Constitutional: Negative for fever, appetite change, fatigue and unexpected weight change.  Eyes: Negative  for pain and visual disturbance.  Respiratory: Negative for cough and shortness of breath.   Cardiovascular: Negative for cp or palpitations    Gastrointestinal: Negative for nausea, diarrhea and constipation.  Genitourinary: Negative for urgency and frequency.  Skin: Negative for pallor or rash  pos for dry area on L elbow MSK pos for joint pain without swelling  Neurological: Negative for weakness, l\ight-headedness,  and headaches.  Hematological: Negative for adenopathy. Does not bruise/bleed easily.  Psychiatric/Behavioral: Negative for dysphoric mood. The patient is not nervous/anxious.         Objective:   Physical Exam  Constitutional: She appears well-developed and well-nourished. No distress.  obese and well appearing   HENT:  Head: Normocephalic and atraumatic.  Mouth/Throat: Oropharynx is clear and moist.  Eyes: Conjunctivae and EOM are normal. Pupils are equal, round, and reactive to light. No scleral icterus.  Neck: Normal range of motion. Neck supple. No JVD present. Carotid bruit is not present. No thyromegaly present.  Cardiovascular: Normal rate and regular rhythm.   Pulmonary/Chest: Effort normal and breath sounds normal. No respiratory distress.  Abdominal: Soft. Bowel sounds are normal.  Musculoskeletal: She exhibits tenderness. She exhibits no edema.  Tender over peri lumbar musculature- worse on L , and also tender over L4-L5 Neg SLR Nl rom hips  Nl gait  Full rom-painful on lateral flex bilateral   Lymphadenopathy:    She has no cervical adenopathy.  Neurological: She is alert. She has normal reflexes. She displays no atrophy and no tremor. No cranial nerve deficit. She exhibits normal muscle tone. Coordination and gait normal.  Skin: Skin is warm and dry. No rash noted. There is erythema. No pallor.  Skin on L elbow is thickened and very dry/ scaley without drainage or tenderness   Psychiatric: She has a normal mood and affect.          Assessment  & Plan:

## 2013-01-31 NOTE — Assessment & Plan Note (Signed)
Chronic reactive dry hyperkeratosis L elbow-no help from amylactin Will continue the thick foot moisturizer and also add an otc cortisone cream several times per week and update me  ? Assess for psoriasis if not imp

## 2013-01-31 NOTE — Assessment & Plan Note (Signed)
I rev last note from Dr Tamala Julian- has spondylosis/ disc changes and most likely spinal stenosis Reduced rom/ sedentary lifestyle due to this Next step is MRI-will go ahead and order that

## 2013-02-08 ENCOUNTER — Telehealth: Payer: Self-pay | Admitting: Family Medicine

## 2013-02-08 ENCOUNTER — Ambulatory Visit
Admission: RE | Admit: 2013-02-08 | Discharge: 2013-02-08 | Disposition: A | Discharge status: Home / Self Care | Payer: Medicare Other | Source: Ambulatory Visit | Attending: Family Medicine | Admitting: Family Medicine

## 2013-02-08 DIAGNOSIS — M79605 Pain in left leg: Secondary | ICD-10-CM

## 2013-02-08 DIAGNOSIS — M4310 Spondylolisthesis, site unspecified: Secondary | ICD-10-CM | POA: Diagnosis not present

## 2013-02-08 DIAGNOSIS — M47817 Spondylosis without myelopathy or radiculopathy, lumbosacral region: Secondary | ICD-10-CM | POA: Diagnosis not present

## 2013-02-08 DIAGNOSIS — M5126 Other intervertebral disc displacement, lumbar region: Secondary | ICD-10-CM | POA: Diagnosis not present

## 2013-02-08 DIAGNOSIS — M5136 Other intervertebral disc degeneration, lumbar region: Secondary | ICD-10-CM | POA: Insufficient documentation

## 2013-02-08 DIAGNOSIS — M545 Low back pain: Secondary | ICD-10-CM

## 2013-02-08 NOTE — Telephone Encounter (Signed)
Message copied by Abner Greenspan on Tue Feb 08, 2013  4:42 PM ------      Message from: Tammi Sou      Created: Tue Feb 08, 2013  3:46 PM       Pt notified of MRI results and agrees with referral, I advise her Rosaria Ferries will call to set appt up, pt said she doesn't have a preference on who she goes to but if you can recommend someone she will go to them ------

## 2013-02-24 DIAGNOSIS — M48061 Spinal stenosis, lumbar region without neurogenic claudication: Secondary | ICD-10-CM | POA: Diagnosis not present

## 2013-02-24 DIAGNOSIS — M4310 Spondylolisthesis, site unspecified: Secondary | ICD-10-CM | POA: Diagnosis not present

## 2013-03-25 ENCOUNTER — Other Ambulatory Visit: Payer: Self-pay | Admitting: Family Medicine

## 2013-05-30 ENCOUNTER — Other Ambulatory Visit: Payer: Self-pay | Admitting: *Deleted

## 2013-05-30 MED ORDER — SIMVASTATIN 20 MG PO TABS: 20.0000 mg | ORAL_TABLET | Freq: Every day | ORAL | Status: DC

## 2013-05-30 MED ORDER — HYDROCHLOROTHIAZIDE 25 MG PO TABS: 25.0000 mg | ORAL_TABLET | Freq: Every day | ORAL | Status: DC

## 2013-05-31 ENCOUNTER — Other Ambulatory Visit: Payer: Self-pay | Admitting: *Deleted

## 2013-05-31 MED ORDER — HYDROCHLOROTHIAZIDE 25 MG PO TABS: 25.0000 mg | ORAL_TABLET | Freq: Every day | ORAL | Status: DC

## 2013-06-01 ENCOUNTER — Other Ambulatory Visit: Payer: Self-pay | Admitting: Family Medicine

## 2013-06-01 NOTE — Telephone Encounter (Signed)
Please schedule f/u in 6 mo and refill until then, thanks

## 2013-06-01 NOTE — Telephone Encounter (Signed)
F/u appt scheduled and med refilled

## 2013-06-01 NOTE — Telephone Encounter (Signed)
Electronic refill request, no recent f/u or CPE and no future appt, please advise

## 2013-06-28 ENCOUNTER — Other Ambulatory Visit: Payer: Self-pay | Admitting: *Deleted

## 2013-06-28 NOTE — Telephone Encounter (Signed)
It is ok to refill until her appt-thanks

## 2013-06-28 NOTE — Telephone Encounter (Signed)
Received faxed refill request from pharmacy for Simvastatin and HCTZ. Last office visit 01/31/13 and last labs over a year ago. Last refill note was sent back to pharmacy that patient needed to schedule appointment and lab work. Patient does not have an appointment scheduled until 12/10/13. Is it okay to refill medication?

## 2013-06-29 MED ORDER — HYDROCHLOROTHIAZIDE 25 MG PO TABS: 25.0000 mg | ORAL_TABLET | Freq: Every day | ORAL | Status: DC

## 2013-06-29 MED ORDER — SIMVASTATIN 20 MG PO TABS: 20.0000 mg | ORAL_TABLET | Freq: Every day | ORAL | Status: DC

## 2013-06-29 NOTE — Telephone Encounter (Signed)
done

## 2013-08-09 DIAGNOSIS — M545 Low back pain, unspecified: Secondary | ICD-10-CM | POA: Diagnosis not present

## 2013-08-09 DIAGNOSIS — M4310 Spondylolisthesis, site unspecified: Secondary | ICD-10-CM | POA: Diagnosis not present

## 2013-08-09 DIAGNOSIS — M47817 Spondylosis without myelopathy or radiculopathy, lumbosacral region: Secondary | ICD-10-CM | POA: Diagnosis not present

## 2013-08-09 DIAGNOSIS — E669 Obesity, unspecified: Secondary | ICD-10-CM | POA: Diagnosis not present

## 2013-08-09 DIAGNOSIS — M48061 Spinal stenosis, lumbar region without neurogenic claudication: Secondary | ICD-10-CM | POA: Diagnosis not present

## 2013-08-09 DIAGNOSIS — IMO0002 Reserved for concepts with insufficient information to code with codable children: Secondary | ICD-10-CM | POA: Diagnosis not present

## 2013-08-16 DIAGNOSIS — R209 Unspecified disturbances of skin sensation: Secondary | ICD-10-CM | POA: Diagnosis not present

## 2013-08-16 DIAGNOSIS — M545 Low back pain: Secondary | ICD-10-CM | POA: Diagnosis not present

## 2013-08-16 DIAGNOSIS — M5126 Other intervertebral disc displacement, lumbar region: Secondary | ICD-10-CM | POA: Diagnosis not present

## 2013-08-16 DIAGNOSIS — Q762 Congenital spondylolisthesis: Secondary | ICD-10-CM | POA: Diagnosis not present

## 2013-08-16 DIAGNOSIS — M47817 Spondylosis without myelopathy or radiculopathy, lumbosacral region: Secondary | ICD-10-CM | POA: Diagnosis not present

## 2013-08-16 DIAGNOSIS — M6240 Contracture of muscle, unspecified site: Secondary | ICD-10-CM | POA: Diagnosis not present

## 2013-11-30 ENCOUNTER — Ambulatory Visit (INDEPENDENT_AMBULATORY_CARE_PROVIDER_SITE_OTHER): Payer: Medicare Other | Admitting: Family Medicine

## 2013-11-30 ENCOUNTER — Encounter: Payer: Self-pay | Admitting: Family Medicine

## 2013-11-30 ENCOUNTER — Other Ambulatory Visit: Payer: Self-pay | Admitting: Family Medicine

## 2013-11-30 VITALS — BP 110/78 | HR 106 | Temp 98.6°F | Ht 59.5 in

## 2013-11-30 DIAGNOSIS — R7309 Other abnormal glucose: Secondary | ICD-10-CM

## 2013-11-30 DIAGNOSIS — E669 Obesity, unspecified: Secondary | ICD-10-CM | POA: Diagnosis not present

## 2013-11-30 DIAGNOSIS — Z23 Encounter for immunization: Secondary | ICD-10-CM | POA: Diagnosis not present

## 2013-11-30 DIAGNOSIS — M5136 Other intervertebral disc degeneration, lumbar region: Secondary | ICD-10-CM

## 2013-11-30 DIAGNOSIS — R739 Hyperglycemia, unspecified: Secondary | ICD-10-CM

## 2013-11-30 DIAGNOSIS — E785 Hyperlipidemia, unspecified: Secondary | ICD-10-CM

## 2013-11-30 DIAGNOSIS — I10 Essential (primary) hypertension: Secondary | ICD-10-CM

## 2013-11-30 DIAGNOSIS — M5137 Other intervertebral disc degeneration, lumbosacral region: Secondary | ICD-10-CM

## 2013-11-30 LAB — CBC WITH DIFFERENTIAL/PLATELET
BASOS ABS: 0 10*3/uL (ref 0.0–0.1)
BASOS PCT: 0.5 % (ref 0.0–3.0)
EOS ABS: 0.2 10*3/uL (ref 0.0–0.7)
Eosinophils Relative: 2.7 % (ref 0.0–5.0)
HEMATOCRIT: 42.5 % (ref 36.0–46.0)
HEMOGLOBIN: 13.9 g/dL (ref 12.0–15.0)
LYMPHS ABS: 2.7 10*3/uL (ref 0.7–4.0)
Lymphocytes Relative: 30 % (ref 12.0–46.0)
MCHC: 32.7 g/dL (ref 30.0–36.0)
MCV: 95.6 fl (ref 78.0–100.0)
Monocytes Absolute: 0.9 10*3/uL (ref 0.1–1.0)
Monocytes Relative: 9.4 % (ref 3.0–12.0)
NEUTROS ABS: 5.2 10*3/uL (ref 1.4–7.7)
Neutrophils Relative %: 57.4 % (ref 43.0–77.0)
Platelets: 363 10*3/uL (ref 150.0–400.0)
RBC: 4.44 Mil/uL (ref 3.87–5.11)
RDW: 14.2 % (ref 11.5–14.6)
WBC: 9.1 10*3/uL (ref 4.5–10.5)

## 2013-11-30 LAB — HEMOGLOBIN A1C: HEMOGLOBIN A1C: 5.9 % (ref 4.6–6.5)

## 2013-11-30 MED ORDER — HYDROCHLOROTHIAZIDE 25 MG PO TABS: 25.0000 mg | ORAL_TABLET | Freq: Every day | ORAL | Status: DC

## 2013-11-30 MED ORDER — LISINOPRIL 5 MG PO TABS: ORAL_TABLET | ORAL | Status: DC

## 2013-11-30 MED ORDER — SIMVASTATIN 20 MG PO TABS: 20.0000 mg | ORAL_TABLET | Freq: Every day | ORAL | Status: DC

## 2013-11-30 NOTE — Progress Notes (Signed)
Pre-visit discussion using our clinic review tool. No additional management support is needed unless otherwise documented below in the visit note.  

## 2013-11-30 NOTE — Progress Notes (Signed)
Subjective:    Patient ID: Courtney Roth, female    DOB: [birth year], 68 y.o.   MRN: [id number]  HPI Here for f/u of chronic health problems   Feeling fair Had a shot for it - went to [facility] (steroid injection) Only worked for 7 days  Still having problems -still has pain  Cannot get a lot of exercise- which is difficult and frustrated   Declines weight today Upset about her weight - continues to gain  Diet is fair/ possibly not optimal  Otherwise she is doing fine - at home even- things are better - happier emotionally  bp is stable today  No cp or palpitations or headaches or edema  No side effects to medicines  BP Readings from Last 3 Encounters:  11/30/13 110/78  01/31/13 110/80  01/15/12 110/70     hyperliidemia  On simvastatin Overdue for labs Lab Results  Component Value Date   CHOL 184 06/27/2010   HDL 38.00* 06/27/2010   LDLCALC 111* 06/27/2010   LDLDIRECT 192.8 11/24/2008   TRIG 173.0* 06/27/2010   CHOLHDL 5 06/27/2010    Hx of hyperglycemia Lab Results  Component Value Date   HGBA1C 5.9 07/08/2011    She does watch what she eats - low fat and low sugar as much as possible  Avoids fried foods   Patient Active Problem List   Diagnosis Date Noted  . Lumbar degenerative disc disease 02/08/2013  . Stress reaction 01/15/2012  . Arm pain, anterior 12/26/2011  . Rash and nonspecific skin eruption 07/28/2011  . Hyperglycemia 03/31/2011  . Numbness in feet 03/31/2011  . DIVERTICULITIS OF COLON 07/09/2010  . PANCREATITIS 07/09/2010  . Low back pain radiating to left leg 07/09/2010  . VITAMIN B12 DEFICIENCY 01/08/2010  . PERSONAL HISTORY OF COLONIC POLYPS 01/08/2010  . EDEMA LEG 11/10/2008  . STRESS REACTION, ACUTE, WITH EMOTIONAL DISTURBANCE 02/16/2008  . OBESITY 01/31/2008  . DYSPNEA ON EXERTION 03/12/2007  . COLITIS 03/04/2007  . HYPERLIPIDEMIA 03/01/2007  . HYPERTENSION 03/01/2007  . GERD 03/01/2007  . COLITIS, ULCERATIVE 03/01/2007  . TOBACCO USE,  QUIT 03/01/2007   Past Medical History  Diagnosis Date  . GERD (gastroesophageal reflux disease)   . Hyperlipidemia   . Hypertension   . Obesity   . Ulcerative colitis   . Stress reaction   . Dyspnea   . Pneumonia   . Colon polyps 03/04/2007    History of Hyperplastic   Past Surgical History  Procedure Laterality Date  . Ruptured tubal pregnancy  1975    1 ovary removed   History  Substance Use Topics  . Smoking status: Former Smoker    Quit date: 10/27/2006  . Smokeless tobacco: Never Used  . Alcohol Use: Yes   Family History  Problem Relation Age of Onset  . Heart disease Mother   . Hypertension Mother   . GER disease Mother   . Osteoporosis Mother   . Heart disease Father   . Crohn's disease Sister   . Colon cancer Neg Hx   . Breast cancer Maternal Aunt   . Breast cancer Cousin   . Clotting disorder Mother   . Diabetes Paternal Uncle    Allergies  Allergen Reactions  . Codeine   . Metoprolol Succinate     REACTION: sob, fatigue   Current Outpatient Prescriptions on File Prior to Visit  Medication Sig Dispense Refill  . aspirin 81 MG tablet Take 81 mg by mouth daily.        Marland Kitchen  CALCIUM-VITAMIN D PO Take 1 tablet by mouth daily.        . Cholecalciferol (VITAMIN D3) 1000 UNITS CAPS Take 1 capsule by mouth daily.       Marland Kitchen MAGNESIUM ASPARTATE PO Take 1 tablet by mouth daily.        . naproxen sodium (ALEVE) 220 MG tablet Take 220 mg by mouth 3 (three) times daily as needed.        . sulfaSALAzine (AZULFIDINE) 500 MG tablet TAKE 2 TABLETS TWICE A DAY  120 tablet  11  . gabapentin (NEURONTIN) 100 MG capsule Take 1 capsule (100 mg total) by mouth 2 (two) times daily.  60 capsule  2  . [DISCONTINUED] mesalamine (LIALDA) 1.2 G EC tablet Take 2 tablets (2.4 g total) by mouth daily.  10 tablet  0   No current facility-administered medications on file prior to visit.    Review of Systems Review of Systems  Constitutional: Negative for fever, appetite change, fatigue  and unexpected weight change.  Eyes: Negative for pain and visual disturbance.  Respiratory: Negative for cough and shortness of breath.   Cardiovascular: Negative for cp or palpitations    Gastrointestinal: Negative for nausea, diarrhea and constipation.  Genitourinary: Negative for urgency and frequency.  Skin: Negative for pallor or rash   MSK pos for chronic disabling back pain  Neurological: Negative for weakness, light-headedness, numbness and headaches.  Hematological: Negative for adenopathy. Does not bruise/bleed easily.  Psychiatric/Behavioral: Negative for dysphoric mood. The patient is not nervous/anxious.  (mood is improved)       Objective:   Physical Exam  Constitutional: She appears well-developed and well-nourished. No distress.  obese and well appearing   HENT:  Head: Normocephalic and atraumatic.  Right Ear: External ear normal.  Left Ear: External ear normal.  Nose: Nose normal.  Mouth/Throat: Oropharynx is clear and moist.  Eyes: Conjunctivae and EOM are normal. Pupils are equal, round, and reactive to light. Right eye exhibits no discharge. Left eye exhibits no discharge. No scleral icterus.  Neck: Normal range of motion. Neck supple. No JVD present. No thyromegaly present.  Cardiovascular: Normal rate, regular rhythm, normal heart sounds and intact distal pulses.  Exam reveals no gallop.   Pulmonary/Chest: Effort normal and breath sounds normal. No respiratory distress. She has no wheezes. She has no rales.  Abdominal: Soft. Bowel sounds are normal. She exhibits no distension and no mass. There is no tenderness.  Musculoskeletal: She exhibits no edema and no tenderness.  Lymphadenopathy:    She has no cervical adenopathy.  Neurological: She is alert. She has normal reflexes. No cranial nerve deficit. She exhibits normal muscle tone. Coordination normal.  Skin: Skin is warm and dry. No rash noted. No erythema. No pallor.  Psychiatric: She has a normal mood and  affect.  Pt voices great frustratoin about her chronic pain and inability to loose wt           Assessment & Plan:

## 2013-11-30 NOTE — Patient Instructions (Signed)
Tetanus vaccine today  Take care of yourself If you are interested in a shingles/zoster vaccine - call your insurance to check on coverage,( you should not get it within 1 month of other vaccines) , then call us for a prescription  for it to take to a pharmacy that gives the shot , or make a nurse visit to get it here depending on your coverage  Check out some chair based exercise programs on line Labs today

## 2013-12-01 LAB — COMPREHENSIVE METABOLIC PANEL
ALT: 19 U/L (ref 0–35)
AST: 22 U/L (ref 0–37)
Albumin: 3.9 g/dL (ref 3.5–5.2)
Alkaline Phosphatase: 68 U/L (ref 39–117)
BILIRUBIN TOTAL: 0.6 mg/dL (ref 0.3–1.2)
BUN: 19 mg/dL (ref 6–23)
CALCIUM: 9.5 mg/dL (ref 8.4–10.5)
CO2: 29 meq/L (ref 19–32)
CREATININE: 1 mg/dL (ref 0.4–1.2)
Chloride: 107 mEq/L (ref 96–112)
GFR: 59.93 mL/min — AB (ref >60.00)
Glucose, Bld: 79 mg/dL (ref 70–99)
Potassium: 4.6 mEq/L (ref 3.5–5.1)
Sodium: 144 mEq/L (ref 135–145)
Total Protein: 7.1 g/dL (ref 6.0–8.3)

## 2013-12-01 LAB — TSH: TSH: 2.77 u[IU]/mL (ref 0.35–5.50)

## 2013-12-01 LAB — LIPID PANEL
CHOLESTEROL: 208 mg/dL — AB (ref 0–200)
HDL: 43.7 mg/dL (ref >39.00)
TRIGLYCERIDES: 124 mg/dL (ref 0.0–149.0)
Total CHOL/HDL Ratio: 5
VLDL: 24.8 mg/dL (ref 0.0–40.0)

## 2013-12-01 LAB — LDL CHOLESTEROL, DIRECT: Direct LDL: 155.2 mg/dL

## 2013-12-01 NOTE — Assessment & Plan Note (Signed)
Discussed how this problem influences overall health and the risks it imposes  Reviewed plan for weight loss with lower calorie diet (via better food choices and also portion control or program like weight watchers) and exercise building up to or more than 30 minutes 5 days per week including some aerobic activity   Pt is frustrated with chronic pain - I suggested chair exercise programs to try

## 2013-12-01 NOTE — Assessment & Plan Note (Signed)
BP: 110/78 mmHg  bp in fair control at this time  No changes needed Disc lifstyle change with low sodium diet and exercise   Lab today

## 2013-12-01 NOTE — Assessment & Plan Note (Signed)
A1C today Worry about DM risk with weight gain

## 2013-12-01 NOTE — Assessment & Plan Note (Signed)
Pt has had steroid inj at [facility] that was not effective  States she cannot exercise or loose wt due to chronic pain

## 2013-12-01 NOTE — Assessment & Plan Note (Signed)
Disc goals for lipids and reasons to control them Rev labs with pt- from last time  zocor gave her a rash  Rev low sat fat diet in detail  Lab today

## 2013-12-02 ENCOUNTER — Telehealth: Payer: Self-pay | Admitting: Family Medicine

## 2013-12-02 MED ORDER — SIMVASTATIN 40 MG PO TABS: 40.0000 mg | ORAL_TABLET | Freq: Every day | ORAL | Status: DC

## 2013-12-02 NOTE — Telephone Encounter (Signed)
Relevant patient education assigned to patient using Emmi. ° °

## 2013-12-02 NOTE — Telephone Encounter (Signed)
Thanks -I will send that to her pharmacy  Please schedule fasting lab in 6 week for lipid/ast/alt for hyperlipidemia

## 2013-12-02 NOTE — Telephone Encounter (Signed)
Message copied by Abner Greenspan on Fri Dec 02, 2013  3:18 PM ------      Message from: Modena Nunnery      Created: Fri Dec 02, 2013 10:42 AM       Spoke to pt and informed her of results and instruction. Pt states that she is willing to increase zocor from 20 to 36m. ------

## 2013-12-05 NOTE — Telephone Encounter (Signed)
Lab appt scheduled for 01/17/14

## 2013-12-12 ENCOUNTER — Other Ambulatory Visit: Payer: Self-pay | Admitting: Gastroenterology

## 2014-01-09 ENCOUNTER — Other Ambulatory Visit: Payer: Self-pay | Admitting: Family Medicine

## 2014-01-09 DIAGNOSIS — E785 Hyperlipidemia, unspecified: Secondary | ICD-10-CM

## 2014-01-17 ENCOUNTER — Other Ambulatory Visit (INDEPENDENT_AMBULATORY_CARE_PROVIDER_SITE_OTHER): Payer: Medicare Other

## 2014-01-17 DIAGNOSIS — E785 Hyperlipidemia, unspecified: Secondary | ICD-10-CM

## 2014-01-17 LAB — AST: AST: 27 U/L (ref 0–37)

## 2014-01-17 LAB — LIPID PANEL
CHOL/HDL RATIO: 4
Cholesterol: 192 mg/dL (ref 0–200)
HDL: 42.9 mg/dL (ref >39.00)
LDL Cholesterol: 115 mg/dL — ABNORMAL HIGH (ref 0–99)
Triglycerides: 171 mg/dL — ABNORMAL HIGH (ref 0.0–149.0)
VLDL: 34.2 mg/dL (ref 0.0–40.0)

## 2014-01-17 LAB — ALT: ALT: 27 U/L (ref 0–35)

## 2014-01-18 ENCOUNTER — Encounter: Payer: Self-pay | Admitting: *Deleted

## 2014-02-12 ENCOUNTER — Other Ambulatory Visit: Payer: Self-pay | Admitting: Family Medicine

## 2014-04-26 ENCOUNTER — Ambulatory Visit (INDEPENDENT_AMBULATORY_CARE_PROVIDER_SITE_OTHER): Payer: Medicare Other | Admitting: Family Medicine

## 2014-04-26 ENCOUNTER — Encounter: Payer: Self-pay | Admitting: Family Medicine

## 2014-04-26 VITALS — BP 114/78 | HR 102 | Temp 99.2°F | Ht 59.5 in

## 2014-04-26 DIAGNOSIS — R002 Palpitations: Secondary | ICD-10-CM

## 2014-04-26 DIAGNOSIS — H8110 Benign paroxysmal vertigo, unspecified ear: Secondary | ICD-10-CM

## 2014-04-26 MED ORDER — MECLIZINE HCL 25 MG PO TABS: 25.0000 mg | ORAL_TABLET | Freq: Three times a day (TID) | ORAL | Status: DC | PRN

## 2014-04-26 MED ORDER — DIAZEPAM 2 MG PO TABS: 2.0000 mg | ORAL_TABLET | Freq: Four times a day (QID) | ORAL | Status: DC | PRN

## 2014-04-26 NOTE — Progress Notes (Signed)
[place] [address] Phone: [phone] Fax: [phone]  Patient ID: Courtney Roth MRN: [id number], DOB: Oct 08, 1946, 68 y.o. Date of Encounter: 04/26/2014  Primary Physician:  Loura Pardon, MD   Chief Complaint: Dizziness, Gurgling sound in left ear and Palpitations   Subjective:   History of Present Illness:  Courtney Roth is a 68 y.o. very pleasant female patient who presents with the following:  Bumping into beds and walls, started past Thursday. Head started to get some spinning. And walked right into it on the left.   Even with lying in bed, it will happen. If open or closed,  Hit bed on Monday.  Today, hit the wall.   Not sick at all recently  Never had vertigo in the past  Cardiac flutters ongoing over the last week. Lasting maybe 5 seconds or less, not often. Never longer than a few seconds.   No other recent neurological signs.  Past Medical History, Surgical History, Social History, Family History, Problem List, Medications, and Allergies have been reviewed and updated if relevant.  Review of Systems:  GEN: No acute illnesses, no fevers, chills. GI: No n/v/d, eating normally Pulm: No SOB Interactive and getting along well at home.  Otherwise, ROS is as per the HPI.  Objective:   Physical Examination: BP 114/78  Pulse 102  Temp(Src) 99.2 F (37.3 C) (Oral)  Ht 4' 11.5" (1.511 m)   GEN: WDWN, NAD, Non-toxic, A & O x 3 HEENT: Atraumatic, Normocephalic. Neck supple. No masses, No LAD. Ears and Nose: No external deformity. Clear TM B, but wax in R canal. Easily inducible vertigo more with laying down and sitting up.  CV: RRR, No M/G/R. No JVD. No thrill. No extra heart sounds. PULM: CTA B, no wheezes, crackles, rhonchi. No retractions. No resp. distress. No accessory muscle use. EXTR: No c/c/e NEURO Normal gait.  PSYCH: Normally interactive. Conversant. Not depressed or anxious appearing.  Calm demeanor.   Laboratory  and Imaging Data:  Assessment & Plan:   BPPV (benign paroxysmal positional vertigo), unspecified laterality  Heart palpitations - Plan: EKG 12-Lead  EKG: Normal sinus rhythm. Normal axis, normal R wave progression, No acute ST elevation or depression. R1Rprime only. O/w normal.  If sx persist further eval, vestib rehab reasonable vs ent  New Prescriptions   DIAZEPAM (VALIUM) 2 MG TABLET    Take 1 tablet (2 mg total) by mouth every 6 (six) hours as needed (vertigo).   MECLIZINE (ANTIVERT) 25 MG TABLET    Take 1 tablet (25 mg total) by mouth 3 (three) times daily as needed for dizziness.   Modified Medications   No medications on file   Orders Placed This Encounter  Procedures  . EKG 12-Lead   Follow-up: No Follow-up on file. Unless noted above, the patient is to follow-up if symptoms worsen. Red flags were reviewed with the patient.  Signed,  Maud Deed. Nakhia Levitan, MD, CA[facility] Sports Medicine   Discontinued Medications   GABAPENTIN (NEURONTIN) 100 MG CAPSULE    Take 1 capsule (100 mg total) by mouth 2 (two) times daily.   Current Medications at Discharge:   Medication List       This list is accurate as of: 04/26/14 11:59 PM.  Always use your most recent med list.               ALEVE 220 MG tablet  Generic drug:  naproxen sodium  Take 220 mg by mouth 3 (three) times daily  as needed.     aspirin 81 MG tablet  Take 81 mg by mouth daily.     CALCIUM-VITAMIN D PO  Take 1 tablet by mouth daily.     diazepam 2 MG tablet  Commonly known as:  VALIUM  Take 1 tablet (2 mg total) by mouth every 6 (six) hours as needed (vertigo).     hydrochlorothiazide 25 MG tablet  Commonly known as:  HYDRODIURIL  Take 1 tablet (25 mg total) by mouth daily.     lisinopril 5 MG tablet  Commonly known as:  PRINIVIL,ZESTRIL  TAKE 1 TABLET (5 MG TOTAL) BY MOUTH DAILY.     MAGNESIUM ASPARTATE PO  Take 1 tablet by mouth daily.     meclizine 25 MG tablet  Commonly known as:  ANTIVERT    Take 1 tablet (25 mg total) by mouth 3 (three) times daily as needed for dizziness.     simvastatin 40 MG tablet  Commonly known as:  ZOCOR  TAKE 1 TABLET (40 MG TOTAL) BY MOUTH AT BEDTIME.     sulfaSALAzine 500 MG tablet  Commonly known as:  AZULFIDINE  TAKE 2 TABLETS TWICE A DAY     Vitamin D3 1000 UNITS Caps  Take 1 capsule by mouth daily.

## 2014-04-26 NOTE — Progress Notes (Signed)
Pre visit review using our clinic review tool, if applicable. No additional management support is needed unless otherwise documented below in the visit note. 

## 2014-07-14 DIAGNOSIS — Z23 Encounter for immunization: Secondary | ICD-10-CM | POA: Diagnosis not present

## 2014-08-12 ENCOUNTER — Other Ambulatory Visit: Payer: Self-pay | Admitting: Family Medicine

## 2014-08-14 NOTE — Telephone Encounter (Signed)
Please refill for 6 mo 

## 2014-08-14 NOTE — Telephone Encounter (Signed)
done

## 2014-08-14 NOTE — Telephone Encounter (Signed)
Electronic refill request, no recent/future appt., please advise  

## 2014-10-02 ENCOUNTER — Encounter: Payer: Self-pay | Admitting: Family Medicine

## 2014-10-02 ENCOUNTER — Ambulatory Visit (INDEPENDENT_AMBULATORY_CARE_PROVIDER_SITE_OTHER): Payer: Medicare Other | Admitting: Family Medicine

## 2014-10-02 ENCOUNTER — Ambulatory Visit (INDEPENDENT_AMBULATORY_CARE_PROVIDER_SITE_OTHER)
Admission: RE | Admit: 2014-10-02 | Discharge: 2014-10-02 | Disposition: A | Discharge status: Home / Self Care | Payer: Medicare Other | Source: Ambulatory Visit | Attending: Family Medicine | Admitting: Family Medicine

## 2014-10-02 VITALS — BP 128/70 | HR 90 | Temp 98.2°F | Ht 59.5 in

## 2014-10-02 DIAGNOSIS — M545 Low back pain, unspecified: Secondary | ICD-10-CM

## 2014-10-02 DIAGNOSIS — Z23 Encounter for immunization: Secondary | ICD-10-CM

## 2014-10-02 DIAGNOSIS — M25561 Pain in right knee: Secondary | ICD-10-CM

## 2014-10-02 DIAGNOSIS — M79605 Pain in left leg: Secondary | ICD-10-CM

## 2014-10-02 DIAGNOSIS — M1711 Unilateral primary osteoarthritis, right knee: Secondary | ICD-10-CM | POA: Diagnosis not present

## 2014-10-02 NOTE — Progress Notes (Signed)
Pre visit review using our clinic review tool, if applicable. No additional management support is needed unless otherwise documented below in the visit note. 

## 2014-10-02 NOTE — Assessment & Plan Note (Signed)
Some medial joint line tenderness  Suspect OA  Xray today and make plan  Disc low impact exercise and need for wt loss (for joint and overall health)

## 2014-10-02 NOTE — Progress Notes (Signed)
Subjective:    Patient ID: Courtney Roth, female    DOB: Oct 21, 1946, 68 y.o.   MRN: [id number]  HPI Here with knee pain  Started 3-4 weeks ago -not getting better  No trauma or precipitating event that she knows of  ? If twisted in bed No regular exercise Has pain under patella and in the back  Makes a snapping sound quite a lot ? If swollen   Hurts to bear weight and bend  Worse with stairs   Is interested in prevnar  Also zoster-? If it is covered    Patient Active Problem List   Diagnosis Date Noted  . Lumbar degenerative disc disease 02/08/2013  . Hyperglycemia 03/31/2011  . Numbness in feet 03/31/2011  . DIVERTICULITIS OF COLON 07/09/2010  . History of pancreatitis 07/09/2010  . Low back pain radiating to left leg 07/09/2010  . VITAMIN B12 DEFICIENCY 01/08/2010  . PERSONAL HISTORY OF COLONIC POLYPS 01/08/2010  . EDEMA LEG 11/10/2008  . OBESITY 01/31/2008  . DYSPNEA ON EXERTION 03/12/2007  . HYPERLIPIDEMIA 03/01/2007  . HYPERTENSION 03/01/2007  . GERD 03/01/2007  . COLITIS, ULCERATIVE 03/01/2007  . TOBACCO USE, QUIT 03/01/2007   Past Medical History  Diagnosis Date  . GERD (gastroesophageal reflux disease)   . Hyperlipidemia   . Hypertension   . Obesity   . Ulcerative colitis   . Stress reaction   . Dyspnea   . Pneumonia   . Colon polyps 03/04/2007    History of Hyperplastic   Past Surgical History  Procedure Laterality Date  . Ruptured tubal pregnancy  1975    1 ovary removed   History  Substance Use Topics  . Smoking status: Former Smoker    Quit date: 10/27/2006  . Smokeless tobacco: Never Used  . Alcohol Use: 0.0 oz/week    0 Not specified per week     Comment: rare   Family History  Problem Relation Age of Onset  . Heart disease Mother   . Hypertension Mother   . GER disease Mother   . Osteoporosis Mother   . Heart disease Father   . Crohn's disease Sister   . Colon cancer Neg Hx   . Breast cancer Maternal Aunt   . Breast  cancer Cousin   . Clotting disorder Mother   . Diabetes Paternal Uncle    Allergies  Allergen Reactions  . Codeine   . Metoprolol Succinate     REACTION: sob, fatigue   Current Outpatient Prescriptions on File Prior to Visit  Medication Sig Dispense Refill  . aspirin 81 MG tablet Take 81 mg by mouth daily.      Marland Kitchen CALCIUM-VITAMIN D PO Take 1 tablet by mouth daily.      . Cholecalciferol (VITAMIN D3) 1000 UNITS CAPS Take 1 capsule by mouth daily.     . diazepam (VALIUM) 2 MG tablet Take 1 tablet (2 mg total) by mouth every 6 (six) hours as needed (vertigo). 30 tablet 0  . hydrochlorothiazide (HYDRODIURIL) 25 MG tablet Take 1 tablet (25 mg total) by mouth daily. 30 tablet 11  . lisinopril (PRINIVIL,ZESTRIL) 5 MG tablet TAKE 1 TABLET (5 MG TOTAL) BY MOUTH DAILY. 30 tablet 11  . MAGNESIUM ASPARTATE PO Take 1 tablet by mouth daily.      . naproxen sodium (ALEVE) 220 MG tablet Take 220 mg by mouth 3 (three) times daily as needed.      . simvastatin (ZOCOR) 40 MG tablet TAKE 1 TABLET (40 MG TOTAL)  BY MOUTH AT BEDTIME. 90 tablet 1  . sulfaSALAzine (AZULFIDINE) 500 MG tablet TAKE 2 TABLETS TWICE A DAY 120 tablet 11  . [DISCONTINUED] mesalamine (LIALDA) 1.2 G EC tablet Take 2 tablets (2.4 g total) by mouth daily. 10 tablet 0   No current facility-administered medications on file prior to visit.    Review of Systems Review of Systems  Constitutional: Negative for fever, appetite change, fatigue and unexpected weight change.  Eyes: Negative for pain and visual disturbance.  Respiratory: Negative for cough and shortness of breath.   Cardiovascular: Negative for cp or palpitations    Gastrointestinal: Negative for nausea, diarrhea and constipation.  Genitourinary: Negative for urgency and frequency.  Skin: Negative for pallor or rash   MSK pos for knee pain without swelling  Neurological: Negative for weakness, light-headedness, numbness and headaches.  Hematological: Negative for adenopathy.  Does not bruise/bleed easily.  Psychiatric/Behavioral: Negative for dysphoric mood. The patient is not nervous/anxious.         Objective:   Physical Exam  Constitutional: She appears well-developed and well-nourished. No distress.  obese and well appearing   Eyes: Conjunctivae and EOM are normal. Pupils are equal, round, and reactive to light.  Neck: Normal range of motion. Neck supple.  Cardiovascular: Normal rate and regular rhythm.   Musculoskeletal: She exhibits tenderness. She exhibits no edema.       Right knee: She exhibits bony tenderness. She exhibits normal range of motion, no swelling, no effusion, no ecchymosis, no deformity, no erythema, normal alignment, no LCL laxity, normal patellar mobility, normal meniscus and no MCL laxity. Tenderness found. Medial joint line and patellar tendon tenderness noted.  R knee - limited rom with pain - cannot flex over 90 degrees  Crepitus noted  Nl ant drawer/lachman tests  Medial joint line tenderness Limping   Neurological: She is alert. She has normal reflexes.  Skin: Skin is warm and dry. No rash noted. No erythema. No pallor.  Psychiatric: She has a normal mood and affect.          Assessment & Plan:   Problem List Items Addressed This Visit      Other   Low back pain radiating to left leg    Chronic low back pain Pt is interested in ref to ortho    Right knee pain - Primary    Some medial joint line tenderness  Suspect OA  Xray today and make plan  Disc low impact exercise and need for wt loss (for joint and overall health)      Relevant Orders      DG Knee Complete 4 Views Right (Completed)    Other Visit Diagnoses    Need for vaccination with 13-polyvalent pneumococcal conjugate vaccine        Relevant Orders       Pneumococcal conjugate vaccine 13-valent (Completed)

## 2014-10-02 NOTE — Patient Instructions (Addendum)
prevnar vaccine today  If you are interested in a shingles/zoster vaccine - call your insurance to check on coverage,( you should not get it within 1 month of other vaccines) , then call us for a prescription  for it to take to a pharmacy that gives the shot , or make a nurse visit to get it here depending on your coverage   Xray of knee today  Will contact you with results and plan   Keep working on weight loss and take care of yourself

## 2014-10-02 NOTE — Assessment & Plan Note (Signed)
Chronic low back pain Pt is interested in ref to ortho

## 2014-10-03 ENCOUNTER — Telehealth: Payer: Self-pay | Admitting: Family Medicine

## 2014-10-03 DIAGNOSIS — M1711 Unilateral primary osteoarthritis, right knee: Secondary | ICD-10-CM | POA: Insufficient documentation

## 2014-10-03 NOTE — Telephone Encounter (Signed)
-----   Message from Tammi Sou, [place] sent at 10/03/2014 12:40 PM EST ----- Pt notified of xray results and agrees with referral to Ortho, please put in referral, I advise pt Marion/Linda will call to schedule appt

## 2014-11-25 ENCOUNTER — Other Ambulatory Visit: Payer: Self-pay | Admitting: Family Medicine

## 2014-12-17 ENCOUNTER — Other Ambulatory Visit: Payer: Self-pay | Admitting: Family Medicine

## 2014-12-23 ENCOUNTER — Other Ambulatory Visit: Payer: Self-pay | Admitting: Family Medicine

## 2015-01-18 ENCOUNTER — Telehealth: Payer: Self-pay

## 2015-01-18 NOTE — Telephone Encounter (Signed)
Left message for pt to call back if she still wants flu vaccine

## 2015-01-22 ENCOUNTER — Other Ambulatory Visit: Payer: Self-pay | Admitting: Gastroenterology

## 2015-01-30 DIAGNOSIS — H5213 Myopia, bilateral: Secondary | ICD-10-CM | POA: Diagnosis not present

## 2015-01-30 DIAGNOSIS — H43313 Vitreous membranes and strands, bilateral: Secondary | ICD-10-CM | POA: Diagnosis not present

## 2015-02-12 ENCOUNTER — Other Ambulatory Visit: Payer: Self-pay | Admitting: Family Medicine

## 2015-03-08 ENCOUNTER — Other Ambulatory Visit: Payer: Self-pay | Admitting: Family Medicine

## 2015-03-10 ENCOUNTER — Other Ambulatory Visit: Payer: Self-pay | Admitting: Family Medicine

## 2015-03-12 NOTE — Telephone Encounter (Signed)
Electronic refill request, pt has had a few acute appt but last OV (med refill) was02/4/15, please advise

## 2015-03-12 NOTE — Telephone Encounter (Signed)
Left voicemail requesting pt to call office back 

## 2015-03-12 NOTE — Telephone Encounter (Signed)
Please f/u this summer and refill until then

## 2015-03-19 ENCOUNTER — Encounter: Payer: Self-pay | Admitting: Family Medicine

## 2015-03-19 ENCOUNTER — Ambulatory Visit (INDEPENDENT_AMBULATORY_CARE_PROVIDER_SITE_OTHER): Payer: Medicare Other | Admitting: Family Medicine

## 2015-03-19 VITALS — BP 128/74 | HR 104 | Temp 98.4°F | Ht 59.5 in

## 2015-03-19 DIAGNOSIS — E669 Obesity, unspecified: Secondary | ICD-10-CM | POA: Diagnosis not present

## 2015-03-19 DIAGNOSIS — R739 Hyperglycemia, unspecified: Secondary | ICD-10-CM | POA: Diagnosis not present

## 2015-03-19 DIAGNOSIS — E785 Hyperlipidemia, unspecified: Secondary | ICD-10-CM | POA: Diagnosis not present

## 2015-03-19 DIAGNOSIS — I10 Essential (primary) hypertension: Secondary | ICD-10-CM

## 2015-03-19 DIAGNOSIS — H9190 Unspecified hearing loss, unspecified ear: Secondary | ICD-10-CM | POA: Insufficient documentation

## 2015-03-19 DIAGNOSIS — H9193 Unspecified hearing loss, bilateral: Secondary | ICD-10-CM

## 2015-03-19 LAB — CBC WITH DIFFERENTIAL/PLATELET
BASOS ABS: 0 10*3/uL (ref 0.0–0.1)
Basophils Relative: 0 % (ref 0.0–3.0)
EOS ABS: 0.1 10*3/uL (ref 0.0–0.7)
Eosinophils Relative: 0.9 % (ref 0.0–5.0)
HCT: 43.6 % (ref 36.0–46.0)
Hemoglobin: 14.7 g/dL (ref 12.0–15.0)
Lymphocytes Relative: 25.9 % (ref 12.0–46.0)
Lymphs Abs: 2.5 10*3/uL (ref 0.7–4.0)
MCHC: 33.6 g/dL (ref 30.0–36.0)
MCV: 91.7 fl (ref 78.0–100.0)
Monocytes Absolute: 0.7 10*3/uL (ref 0.1–1.0)
Monocytes Relative: 7 % (ref 3.0–12.0)
Neutro Abs: 6.5 10*3/uL (ref 1.4–7.7)
Neutrophils Relative %: 66.2 % (ref 43.0–77.0)
Platelets: 350 10*3/uL (ref 150.0–400.0)
RBC: 4.76 Mil/uL (ref 3.87–5.11)
RDW: 15 % (ref 11.5–15.5)
WBC: 9.8 10*3/uL (ref 4.0–10.5)

## 2015-03-19 LAB — COMPREHENSIVE METABOLIC PANEL
ALK PHOS: 66 U/L (ref 39–117)
ALT: 21 U/L (ref 0–35)
AST: 22 U/L (ref 0–37)
Albumin: 4.4 g/dL (ref 3.5–5.2)
BILIRUBIN TOTAL: 0.6 mg/dL (ref 0.2–1.2)
BUN: 24 mg/dL — ABNORMAL HIGH (ref 6–23)
CALCIUM: 9.9 mg/dL (ref 8.4–10.5)
CHLORIDE: 97 meq/L (ref 96–112)
CO2: 30 mEq/L (ref 19–32)
Creatinine, Ser: 0.92 mg/dL (ref 0.40–1.20)
GFR: 64.22 mL/min (ref >60.00)
Glucose, Bld: 98 mg/dL (ref 70–99)
Potassium: 3.6 mEq/L (ref 3.5–5.1)
Sodium: 137 mEq/L (ref 135–145)
Total Protein: 7.7 g/dL (ref 6.0–8.3)

## 2015-03-19 LAB — LIPID PANEL
Cholesterol: 195 mg/dL (ref 0–200)
HDL: 46.9 mg/dL (ref >39.00)
LDL Cholesterol: 121 mg/dL — ABNORMAL HIGH (ref 0–99)
NonHDL: 148.1
Total CHOL/HDL Ratio: 4
Triglycerides: 137 mg/dL (ref 0.0–149.0)
VLDL: 27.4 mg/dL (ref 0.0–40.0)

## 2015-03-19 LAB — TSH: TSH: 2.99 u[IU]/mL (ref 0.35–4.50)

## 2015-03-19 LAB — HEMOGLOBIN A1C: Hgb A1c MFr Bld: 5.6 % (ref 4.6–6.5)

## 2015-03-19 MED ORDER — HYDROCHLOROTHIAZIDE 25 MG PO TABS: ORAL_TABLET | ORAL | Status: DC

## 2015-03-19 MED ORDER — LISINOPRIL 5 MG PO TABS: ORAL_TABLET | ORAL | Status: DC

## 2015-03-19 MED ORDER — SIMVASTATIN 40 MG PO TABS: ORAL_TABLET | ORAL | Status: DC

## 2015-03-19 NOTE — Assessment & Plan Note (Signed)
bp in fair control at this time  BP Readings from Last 1 Encounters:  03/19/15 128/74   No changes needed Disc lifstyle change with low sodium diet and exercise   Labs today

## 2015-03-19 NOTE — Assessment & Plan Note (Signed)
Discussed how this problem influences overall health and the risks it imposes  Reviewed plan for weight loss with lower calorie diet (via better food choices and also portion control or program like weight watchers) and exercise building up to or more than 30 minutes 5 days per week including some aerobic activity   Pt candidly does not watch diet  Unable to exercise due to chronic back pain

## 2015-03-19 NOTE — Progress Notes (Signed)
Subjective:    Patient ID: Courtney Roth, female    DOB: 01-Dec-1945, 69 y.o.   MRN: [id number]  HPI Here here for f/u of chronic conditions   Is thinking of going to a chiropractor for her back problems  Has hx of DDD and anterolithesis and spinal stenosis  She really suffers with this   Declined wt today  Obese   bp is stable today  No cp or palpitations or headaches or edema  No side effects to medicines  BP Readings from Last 3 Encounters:  03/19/15 128/74  10/02/14 128/70  04/26/14 114/78     Due for labs   Hyperglycemia  Lab Results  Component Value Date   HGBA1C 5.9 11/30/2013  she uses truvia instead sugar Does not generally eat sweets  Does eat sugar pops at night   Cholesterol due for a check of cholesterol also  Does not "really watch what she eats" but has never eaten fats to begin with  On zocor   Patient Active Problem List   Diagnosis Date Noted  . Osteoarthritis of right knee 10/03/2014  . Right knee pain 10/02/2014  . Lumbar degenerative disc disease 02/08/2013  . Hyperglycemia 03/31/2011  . Numbness in feet 03/31/2011  . DIVERTICULITIS OF COLON 07/09/2010  . History of pancreatitis 07/09/2010  . Low back pain radiating to left leg 07/09/2010  . VITAMIN B12 DEFICIENCY 01/08/2010  . PERSONAL HISTORY OF COLONIC POLYPS 01/08/2010  . EDEMA LEG 11/10/2008  . OBESITY 01/31/2008  . DYSPNEA ON EXERTION 03/12/2007  . HYPERLIPIDEMIA 03/01/2007  . HYPERTENSION 03/01/2007  . GERD 03/01/2007  . COLITIS, ULCERATIVE 03/01/2007  . TOBACCO USE, QUIT 03/01/2007   Past Medical History  Diagnosis Date  . GERD (gastroesophageal reflux disease)   . Hyperlipidemia   . Hypertension   . Obesity   . Ulcerative colitis   . Stress reaction   . Dyspnea   . Pneumonia   . Colon polyps 03/04/2007    History of Hyperplastic   Past Surgical History  Procedure Laterality Date  . Ruptured tubal pregnancy  1975    1 ovary removed   History  Substance  Use Topics  . Smoking status: Former Smoker    Quit date: 10/27/2006  . Smokeless tobacco: Never Used  . Alcohol Use: 0.0 oz/week    0 Standard drinks or equivalent per week     Comment: rare   Family History  Problem Relation Age of Onset  . Heart disease Mother   . Hypertension Mother   . GER disease Mother   . Osteoporosis Mother   . Heart disease Father   . Crohn's disease Sister   . Colon cancer Neg Hx   . Breast cancer Maternal Aunt   . Breast cancer Cousin   . Clotting disorder Mother   . Diabetes Paternal Uncle    Allergies  Allergen Reactions  . Codeine   . Metoprolol Succinate     REACTION: sob, fatigue   Current Outpatient Prescriptions on File Prior to Visit  Medication Sig Dispense Refill  . aspirin 81 MG tablet Take 81 mg by mouth daily.      Marland Kitchen CALCIUM-VITAMIN D PO Take 1 tablet by mouth daily.      . Cholecalciferol (VITAMIN D3) 1000 UNITS CAPS Take 1 capsule by mouth daily.     . hydrochlorothiazide (HYDRODIURIL) 25 MG tablet TAKE 1 TABLET (25 MG TOTAL) BY MOUTH DAILY. 30 tablet 2  . lisinopril (PRINIVIL,ZESTRIL) 5 MG tablet  TAKE 1 TABLET (5 MG TOTAL) BY MOUTH DAILY. 30 tablet 5  . MAGNESIUM ASPARTATE PO Take 1 tablet by mouth daily.      . naproxen sodium (ALEVE) 220 MG tablet Take 220 mg by mouth 3 (three) times daily as needed.      . simvastatin (ZOCOR) 40 MG tablet TAKE 1 TABLET (40 MG TOTAL) BY MOUTH AT BEDTIME. 30 tablet 0  . sulfaSALAzine (AZULFIDINE) 500 MG tablet TAKE 2 TABLETS TWICE A DAY 120 tablet 11  . [DISCONTINUED] mesalamine (LIALDA) 1.2 G EC tablet Take 2 tablets (2.4 g total) by mouth daily. 10 tablet 0   No current facility-administered medications on file prior to visit.    Review of Systems Review of Systems  Constitutional: Negative for fever, appetite change,  and unexpected weight change.  Eyes: Negative for pain and visual disturbance.  ENT pos for trouble hearing  Respiratory: Negative for cough and shortness of breath.     Cardiovascular: Negative for cp or palpitations    Gastrointestinal: Negative for nausea, diarrhea and constipation.  Genitourinary: Negative for urgency and frequency.  Skin: Negative for pallor or rash   MSK pos for chronic back pain  Neurological: Negative for weakness, light-headedness, numbness and headaches.  Hematological: Negative for adenopathy. Does not bruise/bleed easily.  Psychiatric/Behavioral: Negative for dysphoric mood. The patient is not nervous/anxious.         Objective:   Physical Exam  Constitutional: She appears well-developed and well-nourished. No distress.  obese and well appearing   HENT:  Head: Normocephalic and atraumatic.  Mouth/Throat: Oropharynx is clear and moist.  Eyes: Conjunctivae and EOM are normal. Pupils are equal, round, and reactive to light. Right eye exhibits no discharge. Left eye exhibits no discharge. No scleral icterus.  Neck: Normal range of motion. Neck supple. No JVD present. Carotid bruit is not present. No thyromegaly present.  Cardiovascular: Normal rate, regular rhythm, normal heart sounds and intact distal pulses.  Exam reveals no gallop.   Pulmonary/Chest: Effort normal and breath sounds normal. No respiratory distress. She has no wheezes. She has no rales.  Abdominal: Soft. Bowel sounds are normal. She exhibits no distension and no mass. There is no tenderness.  Musculoskeletal: She exhibits no edema or tenderness.  Poor rom spine Slow gait due to chronic back pain   Lymphadenopathy:    She has no cervical adenopathy.  Neurological: She is alert. She has normal reflexes. No cranial nerve deficit. She exhibits normal muscle tone. Coordination normal.  Skin: Skin is warm and dry. No rash noted. No erythema. No pallor.  Psychiatric: She has a normal mood and affect.          Assessment & Plan:   Problem List Items Addressed This Visit    Essential hypertension - Primary    bp in fair control at this time  BP Readings  from Last 1 Encounters:  03/19/15 128/74   No changes needed Disc lifstyle change with low sodium diet and exercise   Labs today       Relevant Medications   lisinopril (PRINIVIL,ZESTRIL) 5 MG tablet   simvastatin (ZOCOR) 40 MG tablet   hydrochlorothiazide (HYDRODIURIL) 25 MG tablet   Other Relevant Orders   CBC with Differential/Platelet   Comprehensive metabolic panel   TSH   Lipid panel   Hearing problem    Pt c/o of problems/ both ears  Ref for hearing eval       Relevant Orders   Ambulatory referral to ENT  Hyperglycemia    A1C today  Hesitant to change lifestyle  Mobility impaired from back pain  Eats sugar cereal and obese At risk for DM2      Relevant Orders   Hemoglobin A1c   Hyperlipidemia    Due for lipid check zocor and diet  Rev low sat fat diet -has recently eaten shellfish       Relevant Medications   lisinopril (PRINIVIL,ZESTRIL) 5 MG tablet   simvastatin (ZOCOR) 40 MG tablet   hydrochlorothiazide (HYDRODIURIL) 25 MG tablet   Other Relevant Orders   Lipid panel   Obesity    Discussed how this problem influences overall health and the risks it imposes  Reviewed plan for weight loss with lower calorie diet (via better food choices and also portion control or program like weight watchers) and exercise building up to or more than 30 minutes 5 days per week including some aerobic activity   Pt candidly does not watch diet  Unable to exercise due to chronic back pain

## 2015-03-19 NOTE — Assessment & Plan Note (Signed)
Due for lipid check zocor and diet  Rev low sat fat diet -has recently eaten shellfish

## 2015-03-19 NOTE — Patient Instructions (Addendum)
Labs today  Take care of yourself  Stay as active as your back will let you be  Your blood pressure is stable  Lab today  For weight loss - try to cut your calories the best you can

## 2015-03-19 NOTE — Assessment & Plan Note (Signed)
A1C today  Hesitant to change lifestyle  Mobility impaired from back pain  Eats sugar cereal and obese At risk for DM2

## 2015-03-19 NOTE — Assessment & Plan Note (Signed)
Pt c/o of problems/ both ears  Ref for hearing eval

## 2015-03-19 NOTE — Progress Notes (Signed)
Pre visit review using our clinic review tool, if applicable. No additional management support is needed unless otherwise documented below in the visit note. 

## 2015-03-20 ENCOUNTER — Encounter: Payer: Self-pay | Admitting: *Deleted

## 2015-03-22 ENCOUNTER — Telehealth: Payer: Self-pay | Admitting: Gastroenterology

## 2015-03-22 ENCOUNTER — Other Ambulatory Visit: Payer: Self-pay | Admitting: Gastroenterology

## 2015-03-22 ENCOUNTER — Encounter: Payer: Self-pay | Admitting: Gastroenterology

## 2015-03-22 MED ORDER — SULFASALAZINE 500 MG PO TABS: 1000.0000 mg | ORAL_TABLET | Freq: Two times a day (BID) | ORAL | Status: DC

## 2015-03-22 NOTE — Telephone Encounter (Signed)
Pt calling back and said she is completely out of her med

## 2015-03-22 NOTE — Telephone Encounter (Signed)
yes

## 2015-03-22 NOTE — Telephone Encounter (Signed)
Called patient to inform med sent to pharmacy  L/m on voicemail

## 2015-03-22 NOTE — Telephone Encounter (Signed)
Dr Deatra Ina, Patient wants refills on Sulfasalizine  Has not been seen since 2013. Can we refill??

## 2015-03-27 ENCOUNTER — Other Ambulatory Visit: Payer: Self-pay | Admitting: Gastroenterology

## 2015-04-05 DIAGNOSIS — H6123 Impacted cerumen, bilateral: Secondary | ICD-10-CM | POA: Diagnosis not present

## 2015-04-05 DIAGNOSIS — H903 Sensorineural hearing loss, bilateral: Secondary | ICD-10-CM | POA: Diagnosis not present

## 2015-04-05 DIAGNOSIS — H6063 Unspecified chronic otitis externa, bilateral: Secondary | ICD-10-CM | POA: Diagnosis not present

## 2015-05-03 ENCOUNTER — Encounter: Payer: Self-pay | Admitting: Gastroenterology

## 2015-05-29 ENCOUNTER — Encounter: Payer: Self-pay | Admitting: Gastroenterology

## 2015-05-29 ENCOUNTER — Ambulatory Visit (INDEPENDENT_AMBULATORY_CARE_PROVIDER_SITE_OTHER): Payer: Medicare Other | Admitting: Gastroenterology

## 2015-05-29 VITALS — BP 130/70 | HR 60 | Ht 59.5 in | Wt 235.5 lb

## 2015-05-29 DIAGNOSIS — K5120 Ulcerative (chronic) proctitis without complications: Secondary | ICD-10-CM

## 2015-05-29 DIAGNOSIS — Z860100 Personal history of colon polyps, unspecified: Secondary | ICD-10-CM

## 2015-05-29 NOTE — Assessment & Plan Note (Addendum)
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  Plan to continue with the same.  She is due for colonoscopy.  CC Dr. Glori Bickers

## 2015-05-29 NOTE — Patient Instructions (Signed)
It has been recommended to you by your physician that you have a(n)Colonoscopy  completed. Per your request, we did not schedule the procedure(s) today. Please contact our office at [phone] should you decide to have the procedure completed.

## 2015-05-29 NOTE — Progress Notes (Signed)
_                                                                                                                History of Present Illness:  Ms. 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 Glori Bickers for follow-up and therapy..  On Azulfidine symptoms have been well-controlled.  Last colonoscopy in 2011 demonstrated an 18 mm serrated adenoma in the ascending colon and very mild proctitis just inside the anal canal.  She currently feels well and denies rectal bleeding, abdominal pain or diarrhea.   Past Medical History  Diagnosis Date  . GERD (gastroesophageal reflux disease)   . Hyperlipidemia   . Hypertension   . Obesity   . Ulcerative colitis   . Stress reaction   . Dyspnea   . Pneumonia   . Colon polyps 03/04/2007    History of Hyperplastic   Past Surgical History  Procedure Laterality Date  . Ruptured tubal pregnancy  1975    1 ovary removed   family history includes Breast cancer in her cousin and maternal aunt; Clotting disorder in her mother; Crohn's disease in her sister; Diabetes in her paternal uncle; GER disease in her mother; Heart disease in her father and mother; Hypertension in her mother; Osteoporosis in her mother. There is no history of Colon cancer. Current Outpatient Prescriptions  Medication Sig Dispense Refill  . aspirin 81 MG tablet Take 81 mg by mouth daily.      Marland Kitchen CALCIUM-VITAMIN D PO Take 1 tablet by mouth daily.      . Cholecalciferol (VITAMIN D3) 1000 UNITS CAPS Take 1 capsule by mouth daily.     . hydrochlorothiazide (HYDRODIURIL) 25 MG tablet TAKE 1 TABLET (25 MG TOTAL) BY MOUTH DAILY. 30 tablet 11  . lisinopril (PRINIVIL,ZESTRIL) 5 MG tablet TAKE 1 TABLET (5 MG TOTAL) BY MOUTH DAILY. 30 tablet 11  . MAGNESIUM ASPARTATE PO Take 1 tablet by mouth daily.      . naproxen sodium (ALEVE) 220 MG tablet Take 220 mg by mouth 3 (three) times daily as needed.      . simvastatin (ZOCOR)  40 MG tablet TAKE 1 TABLET (40 MG TOTAL) BY MOUTH AT BEDTIME. 30 tablet 11  . sulfaSALAzine (AZULFIDINE) 500 MG tablet Take 2 tablets (1,000 mg total) by mouth 2 (two) times daily. 120 tablet 3  . sulfaSALAzine (AZULFIDINE) 500 MG tablet TAKE 2 TABLETS BY MOUTH TWICE A DAY 120 tablet 10  . [DISCONTINUED] mesalamine (LIALDA) 1.2 G EC tablet Take 2 tablets (2.4 g total) by mouth daily. 10 tablet 0   No current facility-administered medications for this visit.   Allergies as of 05/29/2015 - Review Complete 05/29/2015  Allergen Reaction Noted  . Codeine  09/24/2009  . Metoprolol succinate  03/12/2007    reports that she quit smoking about 8 years ago. She has never used smokeless tobacco. She reports that she drinks alcohol. She reports that she does not use illicit  drugs.   Review of Systems: She has back pain in the lower back Pertinent positive and negative review of systems were noted in the above HPI section. All other review of systems were otherwise negative.  Vital signs were reviewed in today's medical record Physical Exam: General: Well developed , well nourished, no acute distress Skin: anicteric Head: Normocephalic and atraumatic Eyes:  sclerae anicteric, EOMI Ears: Normal auditory acuity Mouth: No deformity or lesions Neck: Supple, no masses or thyromegaly Lymph Nodes: no lymphadenopathy Lungs: Clear throughout to auscultation Heart: Regular rate and rhythm; no murmurs, rubs or bruits Gastroinestinal: Soft, non tender and non distended. No masses, hepatosplenomegaly or hernias noted. Normal Bowel sounds Rectal:deferred Musculoskeletal: Symmetrical with no gross deformities  Skin: No lesions on visible extremities Pulses:  Normal pulses noted Extremities: No clubbing, cyanosis, edema or deformities noted Neurological: Alert oriented x 4, grossly nonfocal Cervical Nodes:  No significant cervical adenopathy Inguinal Nodes: No significant inguinal adenopathy Psychological:   Alert and cooperative. Normal mood and affect  See Assessment and Plan under Problem List

## 2015-05-29 NOTE — Assessment & Plan Note (Signed)
Plan follow-up colonoscopy

## 2015-06-05 ENCOUNTER — Other Ambulatory Visit: Payer: Self-pay | Admitting: Family Medicine

## 2015-08-06 DIAGNOSIS — Z23 Encounter for immunization: Secondary | ICD-10-CM | POA: Diagnosis not present

## 2015-09-03 ENCOUNTER — Telehealth: Payer: Self-pay | Admitting: Family Medicine

## 2015-09-03 ENCOUNTER — Ambulatory Visit: Payer: Medicare Other | Admitting: Family Medicine

## 2015-09-03 ENCOUNTER — Encounter: Payer: Self-pay | Admitting: Family Medicine

## 2015-09-03 NOTE — Telephone Encounter (Signed)
Pt came in for her appointment.  She states there were patients at the window and she immediately sat down.  When she came up to check-in, the time was 20 minutes past her appoinment time.  CMA, SW was called and pt was told she would need to r/s.  I was notified that pt was speaking loudly at the window.  Spoke with the patient in the lobby, she was cursing about having to be rescheduled and also that if she was billed the $50 fee, she would not pay.  Pt also stated that she should be compensated for all the times she has had to wait.  Pt offered 2 other appointment times today with other providers, she declined.  Pt selected first available appointment with Dr. Glori Bickers.

## 2015-09-04 NOTE — Telephone Encounter (Signed)
I have tried to call her numerous times - get the answering machine  Let her a message to call back with good time to reach her

## 2015-09-05 ENCOUNTER — Encounter: Payer: Self-pay | Admitting: Family Medicine

## 2015-09-05 ENCOUNTER — Ambulatory Visit (INDEPENDENT_AMBULATORY_CARE_PROVIDER_SITE_OTHER): Payer: Medicare Other | Admitting: Family Medicine

## 2015-09-05 ENCOUNTER — Telehealth: Payer: Self-pay | Admitting: Family Medicine

## 2015-09-05 VITALS — BP 134/94 | HR 107 | Temp 98.8°F | Ht 59.5 in

## 2015-09-05 DIAGNOSIS — M79602 Pain in left arm: Secondary | ICD-10-CM

## 2015-09-05 DIAGNOSIS — M79605 Pain in left leg: Secondary | ICD-10-CM

## 2015-09-05 NOTE — Progress Notes (Signed)
Pre visit review using our clinic review tool, if applicable. No additional management support is needed unless otherwise documented below in the visit note. 

## 2015-09-05 NOTE — Telephone Encounter (Signed)
Patient dismissed from [facility] by Loura Pardon MD , effective September 03, 2015. Dismissal letter sent out by certified / registered mail. DAJ  Received signed domestic return receipt verifying delivery of certified letter on September 10, 2015  Article number [place] [id number] [place]

## 2015-09-06 NOTE — Assessment & Plan Note (Signed)
Thought this was resolved but per pt very bothersome and she continues to have limited use of arm due to pain

## 2015-09-06 NOTE — Progress Notes (Signed)
   Subjective:    Patient ID: Courtney Roth, female    DOB: Aug 21, 1946, 69 y.o.   MRN: [id number]  HPI Pt is here in ref to continued L arm discomfort from previous blood draw   Of note -she came earlier in the week, was late for her appointment and then became agitated and verbally abusive in the waiting room - upsetting staff and other patients by cursing loudly  For this we have to discharge her from primary care today (she voices understanding)  Pt requests her records be prepared to be released to her when they can be prepared   Continues to be very distressed about arm pain and dysfunction that began after a blood draw here    Review of Systems     Objective:   Physical Exam        Assessment & Plan:

## 2015-09-06 NOTE — Telephone Encounter (Signed)
Called pt to ask how she would like to receive the medical records release form that we will need to get signed by her prior to processing her records request.  She asked that we mail form to her and she will either drop off the request or send back to [place] in the self-addressed stamped envelope.  I explained that this request will take at least 14 business days from [employer] receipt to process.  When I asked if there was anything else we could do for her today, she started to talk about her arm pain.  I gave her Chong Sicilian Pate's number at [phone] (risk management).  Medical release form mailed to pt's home address. / lt

## 2015-09-06 NOTE — Progress Notes (Signed)
We are working with our medical records team to get these records.  I will contact the patient. Thanks!

## 2015-09-17 ENCOUNTER — Ambulatory Visit (INDEPENDENT_AMBULATORY_CARE_PROVIDER_SITE_OTHER): Payer: Medicare Other | Admitting: Family Medicine

## 2015-09-17 ENCOUNTER — Encounter: Payer: Self-pay | Admitting: Family Medicine

## 2015-09-17 VITALS — HR 119 | Temp 99.0°F | Ht 59.5 in

## 2015-09-17 DIAGNOSIS — M79605 Pain in left leg: Secondary | ICD-10-CM

## 2015-09-17 DIAGNOSIS — M79602 Pain in left arm: Secondary | ICD-10-CM

## 2015-09-17 NOTE — Patient Instructions (Signed)
Please stop up front in regards to getting your neurology note from 2013 about your arm  I will look into the process of sending records to another physician instead of printing them  The next number for you to call is Kerman Passey 336 [phone]

## 2015-09-17 NOTE — Progress Notes (Signed)
Pre visit review using our clinic review tool, if applicable. No additional management support is needed unless otherwise documented below in the visit note. 

## 2015-09-17 NOTE — Assessment & Plan Note (Signed)
Reviewed hx with pt  She states it started after a painful blood draw here in mid feb 2013 Then saw [place] on 12/26/11-px gabapentin (which was not helpful) and ref to neurology She cannot remember what neurologist she saw or what they recommended - looking for /sending for that note now  States it improved but did not go away - still disables her from gripping and raising L arm above head  Dec grip ability on exam today   Pt was-in the meantime d/c from the practice due to an issue in the waiting room (documented in the chart) but this is still in the 30 day window of treatment  Will work on getting records sent to next physician when she gets one I do recommend a re visit to neurology for this - pending review of that eval when I get it

## 2015-09-17 NOTE — Progress Notes (Signed)
Subjective:    Patient ID: Courtney Roth, female    DOB: 1945/11/28, 69 y.o.   MRN: [id number]  HPI Here with questions about getting her records and about her injured arm   She originally thought she could have records printed while she waited - (I do not recall telling her this but she says I did and I apologized for the confusion) Found out there is a charge per page to copy and may just prefer we send records to her next physician  Was injured with a blood draw here 12/11/14  (? Radial nerve injury)  She saw me 12/26/11 for the pain   Was ref to neurology-pt cannot remember where she went for this or what recommendation was   Pain improved some but never stopped  Gabapentin did not work  Now - that hand is difficult to close and grip due to pain  Hurts esp in 3 and 4th fingers-severe pain that rad to the base of fingers and wrist  Also hurts (feels like a hot poker)- over antecubital area -"bend of the elbow" Skin is overly sensitive in wrist and antecubital area  Worse to wake up in am (lies on L side but tries not to lie on it) Moving shoulder hurts when she tries to abduct arm above 90 deg  Tingling in all fingers incl thumb - worse in the am  Does not tingle all day  Stays swollen  Cannot grip  Has to close doors with other hands   Hand starts to quiver and tremor when she uses it   Gets really frustrated Some days it hurts to lift a spoon from the drawer    No hx of carpal tunnel    Does not have a new primary care doctor yet  Was discharged from [facility] due to an issue in the waiting room that is documented in the chart  She does apologize for this   Patient Active Problem List   Diagnosis Date Noted  . History of colonic polyps 05/29/2015  . Hearing problem 03/19/2015  . Osteoarthritis of right knee 10/03/2014  . Right knee pain 10/02/2014  . Lumbar degenerative disc disease 02/08/2013  . Hyperglycemia 03/31/2011  . Numbness in feet  03/31/2011  . DIVERTICULITIS OF COLON 07/09/2010  . History of pancreatitis 07/09/2010  . Low back pain radiating to left leg 07/09/2010  . VITAMIN B12 DEFICIENCY 01/08/2010  . Personal history of colonic polyps 01/08/2010  . EDEMA LEG 11/10/2008  . Obesity 01/31/2008  . DYSPNEA ON EXERTION 03/12/2007  . Hyperlipidemia 03/01/2007  . Essential hypertension 03/01/2007  . GERD 03/01/2007  . Ulcerative proctitis ([place]) 03/01/2007  . TOBACCO USE, QUIT 03/01/2007   Past Medical History  Diagnosis Date  . GERD (gastroesophageal reflux disease)   . Hyperlipidemia   . Hypertension   . Obesity   . Ulcerative colitis   . Stress reaction   . Dyspnea   . Pneumonia   . Colon polyps 03/04/2007    History of Hyperplastic   Past Surgical History  Procedure Laterality Date  . Ruptured tubal pregnancy  1975    1 ovary removed   Social History  Substance Use Topics  . Smoking status: Former Smoker    Quit date: 10/27/2006  . Smokeless tobacco: Never Used  . Alcohol Use: 0.0 oz/week    0 Standard drinks or equivalent per week     Comment: rare   Family History  Problem Relation Age of Onset  .  Heart disease Mother   . Hypertension Mother   . GER disease Mother   . Osteoporosis Mother   . Heart disease Father   . Crohn's disease Sister   . Colon cancer Neg Hx   . Breast cancer Maternal Aunt   . Breast cancer Cousin   . Clotting disorder Mother   . Diabetes Paternal Uncle    Allergies  Allergen Reactions  . Codeine   . Metoprolol Succinate     REACTION: sob, fatigue   Current Outpatient Prescriptions on File Prior to Visit  Medication Sig Dispense Refill  . aspirin 81 MG tablet Take 81 mg by mouth daily.      Marland Kitchen CALCIUM-VITAMIN D PO Take 1 tablet by mouth daily.      . Cholecalciferol (VITAMIN D3) 1000 UNITS CAPS Take 1 capsule by mouth daily.     . hydrochlorothiazide (HYDRODIURIL) 25 MG tablet TAKE 1 TABLET (25 MG TOTAL) BY MOUTH DAILY. 30 tablet 11  . lisinopril  (PRINIVIL,ZESTRIL) 5 MG tablet TAKE 1 TABLET (5 MG TOTAL) BY MOUTH DAILY. 30 tablet 11  . MAGNESIUM ASPARTATE PO Take 1 tablet by mouth daily.      . naproxen sodium (ALEVE) 220 MG tablet Take 220 mg by mouth 3 (three) times daily as needed.      . simvastatin (ZOCOR) 40 MG tablet TAKE 1 TABLET (40 MG TOTAL) BY MOUTH AT BEDTIME. 30 tablet 11  . sulfaSALAzine (AZULFIDINE) 500 MG tablet Take 2 tablets (1,000 mg total) by mouth 2 (two) times daily. 120 tablet 3  . [DISCONTINUED] mesalamine (LIALDA) 1.2 G EC tablet Take 2 tablets (2.4 g total) by mouth daily. 10 tablet 0   No current facility-administered medications on file prior to visit.    Review of Systems Review of Systems  Constitutional: Negative for fever, appetite change, fatigue and unexpected weight change.  Eyes: Negative for pain and visual disturbance.  Respiratory: Negative for cough and shortness of breath.   Cardiovascular: Negative for cp or palpitations    Gastrointestinal: Negative for nausea, diarrhea and constipation.  Genitourinary: Negative for urgency and frequency.  Skin: Negative for pallor or rash   MSK pos for intermittent (at times severe) L hand/wrist and arm pain with dec gripping ability and rom of arm Neurological: Negative for weakness, light-headedness, numbness and headaches.  Hematological: Negative for adenopathy. Does not bruise/bleed easily.  Psychiatric/Behavioral: Negative for dysphoric mood. The patient is not nervous/anxious.         Objective:   Physical Exam  Constitutional: She appears well-developed and well-nourished. No distress.  Obese and well appearing   Neck: Normal range of motion. Neck supple.  Musculoskeletal: She exhibits tenderness. She exhibits no edema.  L arm: Mild tenderness over wrist and antecubital area today (not a bad day for her pain per pt) Nl rom of wrist and fingers  Cannot abduct L arm past 90 deg due to pain  Difficulty with int/ext rot of shoulder  Dec grip  due to pain  Neg Hawking's maneuver L arm   Neurological: She is alert. She has normal reflexes. She displays no atrophy. No cranial nerve deficit. She exhibits normal muscle tone. Coordination normal.  Cannot grip with L hand due to pain   Skin: Skin is warm and dry. No rash noted. No erythema. No pallor.  Psychiatric: She has a normal mood and affect.          Assessment & Plan:   Problem List Items Addressed This Visit  Other   Arm pain, anterior - Primary    Reviewed hx with pt  She states it started after a painful blood draw here in mid feb 2013 Then saw [place] on 12/26/11-px gabapentin (which was not helpful) and ref to neurology She cannot remember what neurologist she saw or what they recommended - looking for /sending for that note now  States it improved but did not go away - still disables her from gripping and raising L arm above head  Dec grip ability on exam today   Pt was-in the meantime d/c from the practice due to an issue in the waiting room (documented in the chart) but this is still in the 30 day window of treatment  Will work on getting records sent to next physician when she gets one I do recommend a re visit to neurology for this - pending review of that eval when I get it

## 2015-10-23 DIAGNOSIS — E785 Hyperlipidemia, unspecified: Secondary | ICD-10-CM | POA: Diagnosis not present

## 2015-10-23 DIAGNOSIS — I10 Essential (primary) hypertension: Secondary | ICD-10-CM | POA: Diagnosis not present

## 2015-10-23 DIAGNOSIS — R918 Other nonspecific abnormal finding of lung field: Secondary | ICD-10-CM | POA: Diagnosis not present

## 2015-10-23 DIAGNOSIS — M7989 Other specified soft tissue disorders: Secondary | ICD-10-CM | POA: Diagnosis not present

## 2015-10-24 DIAGNOSIS — K5190 Ulcerative colitis, unspecified, without complications: Secondary | ICD-10-CM | POA: Insufficient documentation

## 2015-10-24 DIAGNOSIS — M7989 Other specified soft tissue disorders: Secondary | ICD-10-CM | POA: Diagnosis not present

## 2015-10-24 DIAGNOSIS — Z7982 Long term (current) use of aspirin: Secondary | ICD-10-CM | POA: Diagnosis not present

## 2015-10-24 DIAGNOSIS — R918 Other nonspecific abnormal finding of lung field: Secondary | ICD-10-CM | POA: Diagnosis not present

## 2015-10-24 DIAGNOSIS — Z8719 Personal history of other diseases of the digestive system: Secondary | ICD-10-CM | POA: Diagnosis not present

## 2015-10-25 ENCOUNTER — Encounter: Payer: Self-pay | Admitting: *Deleted

## 2015-11-09 DIAGNOSIS — M7989 Other specified soft tissue disorders: Secondary | ICD-10-CM | POA: Diagnosis not present

## 2015-11-09 DIAGNOSIS — R531 Weakness: Secondary | ICD-10-CM | POA: Diagnosis not present

## 2015-11-09 DIAGNOSIS — G8929 Other chronic pain: Secondary | ICD-10-CM | POA: Diagnosis not present

## 2015-11-09 DIAGNOSIS — R600 Localized edema: Secondary | ICD-10-CM | POA: Diagnosis not present

## 2015-11-09 DIAGNOSIS — Z7982 Long term (current) use of aspirin: Secondary | ICD-10-CM | POA: Diagnosis not present

## 2015-11-09 DIAGNOSIS — E785 Hyperlipidemia, unspecified: Secondary | ICD-10-CM | POA: Diagnosis not present

## 2015-11-09 DIAGNOSIS — R002 Palpitations: Secondary | ICD-10-CM | POA: Diagnosis not present

## 2015-11-09 DIAGNOSIS — Z6841 Body Mass Index (BMI) 40.0 and over, adult: Secondary | ICD-10-CM | POA: Diagnosis not present

## 2015-11-09 DIAGNOSIS — M545 Low back pain: Secondary | ICD-10-CM | POA: Diagnosis not present

## 2015-11-09 DIAGNOSIS — M25442 Effusion, left hand: Secondary | ICD-10-CM | POA: Diagnosis not present

## 2015-11-09 DIAGNOSIS — R209 Unspecified disturbances of skin sensation: Secondary | ICD-10-CM | POA: Diagnosis not present

## 2015-11-09 DIAGNOSIS — M79632 Pain in left forearm: Secondary | ICD-10-CM | POA: Diagnosis not present

## 2015-11-09 DIAGNOSIS — M25532 Pain in left wrist: Secondary | ICD-10-CM | POA: Diagnosis not present

## 2015-11-09 DIAGNOSIS — K219 Gastro-esophageal reflux disease without esophagitis: Secondary | ICD-10-CM | POA: Diagnosis not present

## 2015-11-09 DIAGNOSIS — Z79899 Other long term (current) drug therapy: Secondary | ICD-10-CM | POA: Diagnosis not present

## 2015-11-09 DIAGNOSIS — M79642 Pain in left hand: Secondary | ICD-10-CM | POA: Diagnosis not present

## 2015-11-09 DIAGNOSIS — Z87891 Personal history of nicotine dependence: Secondary | ICD-10-CM | POA: Diagnosis not present

## 2016-02-28 ENCOUNTER — Other Ambulatory Visit: Payer: Self-pay | Admitting: Family Medicine

## 2016-05-10 ENCOUNTER — Other Ambulatory Visit: Payer: Self-pay | Admitting: Family Medicine

## 2016-05-20 ENCOUNTER — Other Ambulatory Visit: Payer: Self-pay | Admitting: Family Medicine

## 2016-05-22 ENCOUNTER — Other Ambulatory Visit: Payer: Self-pay | Admitting: Family Medicine

## 2016-05-27 ENCOUNTER — Other Ambulatory Visit: Payer: Self-pay | Admitting: Internal Medicine

## 2016-05-27 DIAGNOSIS — M545 Low back pain, unspecified: Secondary | ICD-10-CM

## 2016-05-29 ENCOUNTER — Other Ambulatory Visit: Payer: Self-pay | Admitting: Family Medicine

## 2016-06-04 ENCOUNTER — Ambulatory Visit
Admission: RE | Admit: 2016-06-04 | Discharge: 2016-06-04 | Disposition: A | Discharge status: Home / Self Care | Payer: Medicare Other | Source: Ambulatory Visit | Attending: Internal Medicine | Admitting: Internal Medicine

## 2016-06-04 DIAGNOSIS — M545 Low back pain, unspecified: Secondary | ICD-10-CM

## 2016-06-04 DIAGNOSIS — M5126 Other intervertebral disc displacement, lumbar region: Secondary | ICD-10-CM | POA: Insufficient documentation

## 2016-06-04 DIAGNOSIS — M4806 Spinal stenosis, lumbar region: Secondary | ICD-10-CM | POA: Diagnosis not present

## 2016-08-07 ENCOUNTER — Ambulatory Visit
Admission: RE | Admit: 2016-08-07 | Discharge: 2016-08-07 | Disposition: A | Discharge status: Home / Self Care | Payer: Medicare Other | Source: Ambulatory Visit | Attending: Internal Medicine | Admitting: Internal Medicine

## 2016-08-07 ENCOUNTER — Other Ambulatory Visit: Payer: Self-pay | Admitting: Internal Medicine

## 2016-08-07 DIAGNOSIS — M19042 Primary osteoarthritis, left hand: Secondary | ICD-10-CM | POA: Insufficient documentation

## 2016-08-07 DIAGNOSIS — R52 Pain, unspecified: Secondary | ICD-10-CM

## 2016-08-07 DIAGNOSIS — M1712 Unilateral primary osteoarthritis, left knee: Secondary | ICD-10-CM | POA: Diagnosis not present

## 2016-08-07 DIAGNOSIS — M2550 Pain in unspecified joint: Secondary | ICD-10-CM | POA: Insufficient documentation

## 2016-08-07 DIAGNOSIS — M19041 Primary osteoarthritis, right hand: Secondary | ICD-10-CM | POA: Diagnosis not present

## 2016-08-29 DIAGNOSIS — I158 Other secondary hypertension: Secondary | ICD-10-CM | POA: Diagnosis not present

## 2016-08-29 DIAGNOSIS — E781 Pure hyperglyceridemia: Secondary | ICD-10-CM | POA: Diagnosis not present

## 2016-08-29 DIAGNOSIS — E784 Other hyperlipidemia: Secondary | ICD-10-CM | POA: Diagnosis not present

## 2016-08-29 DIAGNOSIS — I10 Essential (primary) hypertension: Secondary | ICD-10-CM | POA: Diagnosis not present

## 2016-08-29 DIAGNOSIS — R7889 Finding of other specified substances, not normally found in blood: Secondary | ICD-10-CM | POA: Diagnosis not present

## 2016-08-29 DIAGNOSIS — E039 Hypothyroidism, unspecified: Secondary | ICD-10-CM | POA: Diagnosis not present

## 2016-08-29 DIAGNOSIS — E032 Hypothyroidism due to medicaments and other exogenous substances: Secondary | ICD-10-CM | POA: Diagnosis not present

## 2016-08-29 DIAGNOSIS — E559 Vitamin D deficiency, unspecified: Secondary | ICD-10-CM | POA: Diagnosis not present

## 2016-10-01 DIAGNOSIS — E032 Hypothyroidism due to medicaments and other exogenous substances: Secondary | ICD-10-CM | POA: Diagnosis not present

## 2016-10-03 DIAGNOSIS — E039 Hypothyroidism, unspecified: Secondary | ICD-10-CM | POA: Diagnosis not present

## 2016-11-28 DIAGNOSIS — E784 Other hyperlipidemia: Secondary | ICD-10-CM | POA: Diagnosis not present

## 2016-11-28 DIAGNOSIS — E039 Hypothyroidism, unspecified: Secondary | ICD-10-CM | POA: Diagnosis not present

## 2016-11-28 DIAGNOSIS — I10 Essential (primary) hypertension: Secondary | ICD-10-CM | POA: Diagnosis not present

## 2016-11-28 DIAGNOSIS — E032 Hypothyroidism due to medicaments and other exogenous substances: Secondary | ICD-10-CM | POA: Diagnosis not present

## 2016-11-28 DIAGNOSIS — I158 Other secondary hypertension: Secondary | ICD-10-CM | POA: Diagnosis not present

## 2016-11-28 DIAGNOSIS — R7889 Finding of other specified substances, not normally found in blood: Secondary | ICD-10-CM | POA: Diagnosis not present

## 2016-12-20 ENCOUNTER — Other Ambulatory Visit: Payer: Self-pay | Admitting: Family Medicine

## 2017-01-22 ENCOUNTER — Other Ambulatory Visit: Payer: Self-pay | Admitting: Family Medicine

## 2017-05-29 DIAGNOSIS — I158 Other secondary hypertension: Secondary | ICD-10-CM | POA: Diagnosis not present

## 2017-05-29 DIAGNOSIS — E784 Other hyperlipidemia: Secondary | ICD-10-CM | POA: Diagnosis not present

## 2017-05-29 DIAGNOSIS — E039 Hypothyroidism, unspecified: Secondary | ICD-10-CM | POA: Diagnosis not present

## 2017-05-29 DIAGNOSIS — I10 Essential (primary) hypertension: Secondary | ICD-10-CM | POA: Diagnosis not present

## 2017-05-29 DIAGNOSIS — E559 Vitamin D deficiency, unspecified: Secondary | ICD-10-CM | POA: Diagnosis not present

## 2017-05-29 DIAGNOSIS — E032 Hypothyroidism due to medicaments and other exogenous substances: Secondary | ICD-10-CM | POA: Diagnosis not present

## 2017-05-29 DIAGNOSIS — M545 Low back pain: Secondary | ICD-10-CM | POA: Diagnosis not present

## 2017-05-29 DIAGNOSIS — R7889 Finding of other specified substances, not normally found in blood: Secondary | ICD-10-CM | POA: Diagnosis not present

## 2017-05-29 DIAGNOSIS — E781 Pure hyperglyceridemia: Secondary | ICD-10-CM | POA: Diagnosis not present

## 2017-07-16 DIAGNOSIS — Z23 Encounter for immunization: Secondary | ICD-10-CM | POA: Diagnosis not present

## 2017-09-01 DIAGNOSIS — I158 Other secondary hypertension: Secondary | ICD-10-CM | POA: Diagnosis not present

## 2017-09-01 DIAGNOSIS — E039 Hypothyroidism, unspecified: Secondary | ICD-10-CM | POA: Diagnosis not present

## 2017-09-01 DIAGNOSIS — R7989 Other specified abnormal findings of blood chemistry: Secondary | ICD-10-CM | POA: Diagnosis not present

## 2017-09-01 DIAGNOSIS — E785 Hyperlipidemia, unspecified: Secondary | ICD-10-CM | POA: Diagnosis not present

## 2017-09-01 DIAGNOSIS — E781 Pure hyperglyceridemia: Secondary | ICD-10-CM | POA: Diagnosis not present

## 2017-09-01 DIAGNOSIS — R7889 Finding of other specified substances, not normally found in blood: Secondary | ICD-10-CM | POA: Diagnosis not present

## 2017-09-01 DIAGNOSIS — E538 Deficiency of other specified B group vitamins: Secondary | ICD-10-CM | POA: Diagnosis not present

## 2017-09-01 DIAGNOSIS — M129 Arthropathy, unspecified: Secondary | ICD-10-CM | POA: Diagnosis not present

## 2017-09-28 DIAGNOSIS — M25511 Pain in right shoulder: Secondary | ICD-10-CM | POA: Diagnosis not present

## 2017-09-28 DIAGNOSIS — M25552 Pain in left hip: Secondary | ICD-10-CM | POA: Diagnosis not present

## 2017-10-12 DIAGNOSIS — M5136 Other intervertebral disc degeneration, lumbar region: Secondary | ICD-10-CM | POA: Insufficient documentation

## 2017-10-12 DIAGNOSIS — M19041 Primary osteoarthritis, right hand: Secondary | ICD-10-CM | POA: Diagnosis not present

## 2017-10-12 DIAGNOSIS — M19042 Primary osteoarthritis, left hand: Secondary | ICD-10-CM | POA: Diagnosis not present

## 2017-10-12 DIAGNOSIS — M170 Bilateral primary osteoarthritis of knee: Secondary | ICD-10-CM | POA: Insufficient documentation

## 2017-10-12 DIAGNOSIS — M179 Osteoarthritis of knee, unspecified: Secondary | ICD-10-CM | POA: Diagnosis not present

## 2017-10-14 DIAGNOSIS — M5416 Radiculopathy, lumbar region: Secondary | ICD-10-CM | POA: Diagnosis not present

## 2017-10-14 DIAGNOSIS — M5136 Other intervertebral disc degeneration, lumbar region: Secondary | ICD-10-CM | POA: Diagnosis not present

## 2017-10-15 DIAGNOSIS — M5416 Radiculopathy, lumbar region: Secondary | ICD-10-CM | POA: Diagnosis not present

## 2017-10-15 DIAGNOSIS — M5126 Other intervertebral disc displacement, lumbar region: Secondary | ICD-10-CM | POA: Diagnosis not present

## 2017-12-14 DIAGNOSIS — G894 Chronic pain syndrome: Secondary | ICD-10-CM | POA: Insufficient documentation

## 2018-01-11 ENCOUNTER — Encounter: Payer: Self-pay | Admitting: Nurse Practitioner

## 2018-01-11 ENCOUNTER — Ambulatory Visit: Payer: Medicare Other | Attending: Nurse Practitioner | Admitting: Nurse Practitioner

## 2018-01-11 DIAGNOSIS — E785 Hyperlipidemia, unspecified: Secondary | ICD-10-CM | POA: Diagnosis not present

## 2018-01-11 DIAGNOSIS — M5136 Other intervertebral disc degeneration, lumbar region: Secondary | ICD-10-CM | POA: Insufficient documentation

## 2018-01-11 DIAGNOSIS — Z8261 Family history of arthritis: Secondary | ICD-10-CM | POA: Insufficient documentation

## 2018-01-11 DIAGNOSIS — Z8249 Family history of ischemic heart disease and other diseases of the circulatory system: Secondary | ICD-10-CM | POA: Insufficient documentation

## 2018-01-11 DIAGNOSIS — Z8379 Family history of other diseases of the digestive system: Secondary | ICD-10-CM | POA: Diagnosis not present

## 2018-01-11 DIAGNOSIS — Z833 Family history of diabetes mellitus: Secondary | ICD-10-CM | POA: Insufficient documentation

## 2018-01-11 DIAGNOSIS — Z885 Allergy status to narcotic agent status: Secondary | ICD-10-CM | POA: Insufficient documentation

## 2018-01-11 DIAGNOSIS — Z803 Family history of malignant neoplasm of breast: Secondary | ICD-10-CM | POA: Insufficient documentation

## 2018-01-11 DIAGNOSIS — K219 Gastro-esophageal reflux disease without esophagitis: Secondary | ICD-10-CM | POA: Diagnosis not present

## 2018-01-11 DIAGNOSIS — Z86010 Personal history of colonic polyps: Secondary | ICD-10-CM | POA: Insufficient documentation

## 2018-01-11 DIAGNOSIS — Z832 Family history of diseases of the blood and blood-forming organs and certain disorders involving the immune mechanism: Secondary | ICD-10-CM | POA: Insufficient documentation

## 2018-01-11 DIAGNOSIS — Z791 Long term (current) use of non-steroidal anti-inflammatories (NSAID): Secondary | ICD-10-CM | POA: Insufficient documentation

## 2018-01-11 DIAGNOSIS — Z87891 Personal history of nicotine dependence: Secondary | ICD-10-CM | POA: Insufficient documentation

## 2018-01-11 DIAGNOSIS — M170 Bilateral primary osteoarthritis of knee: Secondary | ICD-10-CM | POA: Diagnosis not present

## 2018-01-11 DIAGNOSIS — Z79899 Other long term (current) drug therapy: Secondary | ICD-10-CM | POA: Diagnosis not present

## 2018-01-11 DIAGNOSIS — Z79891 Long term (current) use of opiate analgesic: Secondary | ICD-10-CM | POA: Diagnosis not present

## 2018-01-11 DIAGNOSIS — K859 Acute pancreatitis without necrosis or infection, unspecified: Secondary | ICD-10-CM | POA: Diagnosis not present

## 2018-01-11 DIAGNOSIS — Z9889 Other specified postprocedural states: Secondary | ICD-10-CM | POA: Diagnosis not present

## 2018-01-11 DIAGNOSIS — G894 Chronic pain syndrome: Secondary | ICD-10-CM | POA: Diagnosis not present

## 2018-01-11 DIAGNOSIS — I129 Hypertensive chronic kidney disease with stage 1 through stage 4 chronic kidney disease, or unspecified chronic kidney disease: Secondary | ICD-10-CM | POA: Insufficient documentation

## 2018-01-11 DIAGNOSIS — Z7982 Long term (current) use of aspirin: Secondary | ICD-10-CM | POA: Diagnosis not present

## 2018-01-11 DIAGNOSIS — E669 Obesity, unspecified: Secondary | ICD-10-CM | POA: Insufficient documentation

## 2018-01-11 DIAGNOSIS — R609 Edema, unspecified: Secondary | ICD-10-CM | POA: Diagnosis not present

## 2018-01-11 NOTE — Progress Notes (Signed)
Patient's Name: Courtney Roth  MRN: [id number]  Referring Provider: Marlowe Sax*  DOB: 01/16/1946  PCP: Casilda Carls, MD  DOS: 01/11/2018  Note by: Dionisio David NP  Service setting: Ambulatory outpatient  Specialty: Interventional Pain Management  Location: [facility] (AMB) Pain Management Facility    Patient type: New Patient    Primary Reason(s) for Visit: Initial Patient Evaluation CC: Back Pain (lower left is worse bottom of spine and mid back at waisteline)  HPI  Courtney Roth is a 72 y.o. year old, female patient, who comes today for an initial evaluation. She has VITAMIN B12 DEFICIENCY; Hyperlipidemia; Obesity; Essential hypertension; GERD; Ulcerative proctitis ([place]); DIVERTICULITIS OF COLON; History of pancreatitis; Low back pain radiating to left leg; EDEMA LEG; DYSPNEA ON EXERTION; Personal history of colonic polyps; TOBACCO USE, QUIT; Hyperglycemia; Numbness in feet; Arm pain, anterior; Lumbar degenerative disc disease; Right knee pain; Osteoarthritis of right knee; Hearing problem; History of colonic polyps; Pain syndrome, chronic; UC (ulcerative colitis) ([place]); Low back pain; DDD (degenerative disc disease), lumbar; Localized osteoarthritis of hands, bilateral; and Osteoarthritis of both knees on their problem list.. Her primarily concern today is the Back Pain (lower left is worse bottom of spine and mid back at waisteline)  Pain Assessment: Location: Lower, Left Back Radiating: patient describes the pain as being in lower back and centralized to L SI joint area.  This stems from a fall in 2006 when a chair collapsed on her  Onset: More than a month ago Duration: Chronic pain Quality: Discomfort, Constant, Burning Severity: 8 /10 (self-reported pain score)  Note: Reported level is compatible with observation. Clinically the patient looks like a 2/10 A 2/10 is viewed as "Mild to Moderate" and described as noticeable and distracting. Impossible to hide from other people.  More frequent flare-ups. Still possible to adapt and function close to normal. It can be very annoying and may have occasional stronger flare-ups. With discipline, patients may get used to it and adapt. Information on the proper use of the pain scale provided to the patient today. When using our objective Pain Scale, levels between 6 and 10/10 are said to belong in an emergency room, as it progressively worsens from a 6/10, described as severely limiting, requiring emergency care not usually available at an outpatient pain management facility. At a 6/10 level, communication becomes difficult and requires great effort. Assistance to reach the emergency department may be required. Facial flushing and profuse sweating along with potentially dangerous increases in heart rate and blood pressure will be evident. Effect on ADL: pain is very limiting as to what she can do.  Timing: Constant Modifying factors: laying flat completely alleviates pain.  walking and standing makes the pain worse.   Onset and Duration: Date of onset: 2006 Cause of pain: fell when sitting in a chair Severity: No change since onset, NAS-11 at its worse: 8/10, NAS-11 at its best: 0/10, NAS-11 now: 8/10 and NAS-11 on the average: 8/10 Timing: During activity or exercise and After activity or exercise Aggravating Factors: Prolonged sitting, Prolonged standing and Walking Alleviating Factors: Lying down Associated Problems: Personality changes Quality of Pain: Annoying, Burning and Constant Previous Examinations or Tests: Nerve block Previous Treatments: Epidural steroid injections and Facet blocks  The patient comes into the clinics today for the first time for a chronic pain management evaluation.  Today I took the time to provide the patient with information regarding this pain practice. The patient was informed that the practice is divided into two sections: an  interventional pain management section, as well as a completely  separate and distinct medication management section. I explained that there are procedure days for interventional therapies, and evaluation days for follow-ups and medication management. Because of the amount of documentation required during both, they are kept separated. This means that there is the possibility that she may be scheduled for a procedure on one day, and medication management the next. I have also informed her that because of staffing and facility limitations, this practice will no longer take patients for medication management only. To illustrate the reasons for this, I gave the patient the example of surgeons, and how inappropriate it would be to refer a patient to his/her care, just to write for the post-surgical antibiotics on a surgery done by a different surgeon.   Because interventional pain management is part of the board-certified specialty for the doctors, the patient was informed that joining this practice means that they are open to any and all interventional therapies. I made it clear that this does not mean that they will be forced to have any procedures done. What this means is that I believe interventional therapies to be essential part of the diagnosis and proper management of chronic pain conditions. Therefore, patients not interested in these interventional alternatives will be better served under the care of a different practitioner.  The patient was also made aware of my Comprehensive Pain Management Safety Guidelines where by joining this practice, they limit all of their nerve blocks and joint injections to those done by our practice, for as long as we are retained to manage their care. Historic Controlled Substance Pharmacotherapy Review  PMP and historical list of controlled substances: tramadol 50 mg 4 times daily(fill date 12/30/2017) tramadol 200 mg per day Highest opioid analgesic regimen found: tramadol 50 mg 4 times daily(fill date 12/30/2017) tramadol 200 mg per  day Most recent opioid analgesic: tramadol 50 mg 4 times daily(fill date 12/30/2017) tramadol 200 mg per day Current opioid analgesics: tramadol 50 mg 4 times daily(fill date 12/30/2017) tramadol 200 mg per day Highest recorded MME/day: 20 mg/day MME/day:20 mg/day Medications: The patient did not bring the medication(s) to the appointment, as requested in our "New Patient Package" Pharmacodynamics: Desired effects: Analgesia: The patient reports >50% benefit. Reported improvement in function: The patient reports medication allows her to accomplish basic ADLs. Clinically meaningful improvement in function (CMIF): Sustained CMIF goals met Perceived effectiveness: Described as relatively effective, allowing for increase in activities of daily living (ADL) Undesirable effects: Side-effects or Adverse reactions: None reported Historical Monitoring: The patient  reports that she does not use drugs. List of all UDS Test(s): No results found for: MDMA, COCAINSCRNUR, PCPSCRNUR, PCPQUANT, CANNABQUANT, THCU, [place] List of all Serum Drug Screening Test(s):  No results found for: AMPHSCRSER, BARBSCRSER, BENZOSCRSER, COCAINSCRSER, PCPSCRSER, PCPQUANT, THCSCRSER, CANNABQUANT, OPIATESCRSER, OXYSCRSER, PROPOXSCRSER Historical Background Evaluation: [place] PDMP: Six (6) year initial data search conducted.             [place] Department of public safety, offender search: Editor, commissioning Information) Non-contributory Risk Assessment Profile: Aberrant behavior: None observed or detected today Risk factors for fatal opioid overdose: None identified today Fatal overdose hazard ratio (HR): Calculation deferred Non-fatal overdose hazard ratio (HR): Calculation deferred Risk of opioid abuse or dependence: 0.7-3.0% with doses ? 36 MME/day and 6.1-26% with doses ? 120 MME/day. Substance use disorder (SUD) risk level: Pending results of Medical Psychology Evaluation for SUD Opioid risk tool (ORT) (Total Score): 0  ORT Scoring  interpretation table:  Score <3 = Low Risk for SUD  Score between 4-7 = Moderate Risk for SUD  Score >8 = High Risk for Opioid Abuse   PHQ-2 Depression Scale:  Total score: 0  PHQ-2 Scoring interpretation table: (Score and probability of major depressive disorder)  Score 0 = No depression  Score 1 = 15.4% Probability  Score 2 = 21.1% Probability  Score 3 = 38.4% Probability  Score 4 = 45.5% Probability  Score 5 = 56.4% Probability  Score 6 = 78.6% Probability   PHQ-9 Depression Scale:  Total score: 0  PHQ-9 Scoring interpretation table:  Score 0-4 = No depression  Score 5-9 = Mild depression  Score 10-14 = Moderate depression  Score 15-19 = Moderately severe depression  Score 20-27 = Severe depression (2.4 times higher risk of SUD and 2.89 times higher risk of overuse)   Pharmacologic Plan: Pending ordered tests and/or consults  Meds  The patient has a current medication list which includes the following prescription(s): acetaminophen, aspirin, calcium-vitamin d, vitamin d3, hydrochlorothiazide, levothyroxine, lisinopril, simvastatin, sulfasalazine, magnesium, naproxen sodium, tramadol, and mesalamine.  Current Outpatient Medications on File Prior to Visit  Medication Sig  . acetaminophen (TYLENOL) 500 MG tablet Take 1,000 mg by mouth every 6 (six) hours as needed for mild pain (patient takes 1 - 2 depending on activity level).  Marland Kitchen aspirin 81 MG tablet Take 81 mg by mouth daily.    Marland Kitchen CALCIUM-VITAMIN D PO Take 1 tablet by mouth daily.    . Cholecalciferol (VITAMIN D3) 1000 UNITS CAPS Take 1 capsule by mouth daily.   . hydrochlorothiazide (HYDRODIURIL) 25 MG tablet TAKE 1 TABLET (25 MG TOTAL) BY MOUTH DAILY.  Marland Kitchen levothyroxine (SYNTHROID) 75 MCG tablet Take 1 tablet by mouth daily.  Marland Kitchen lisinopril (PRINIVIL,ZESTRIL) 5 MG tablet TAKE 1 TABLET (5 MG TOTAL) BY MOUTH DAILY.  . simvastatin (ZOCOR) 40 MG tablet TAKE 1 TABLET (40 MG TOTAL) BY MOUTH AT BEDTIME.  Marland Kitchen sulfaSALAzine (AZULFIDINE)  500 MG tablet Take 2 tablets (1,000 mg total) by mouth 2 (two) times daily.  Marland Kitchen MAGNESIUM ASPARTATE PO Take 1 tablet by mouth daily.    . naproxen sodium (ALEVE) 220 MG tablet Take 220 mg by mouth 3 (three) times daily as needed.    . traMADol (ULTRAM) 50 MG tablet Take 1-2 tablets by mouth 3 (three) times daily. Patient states she only takes 3 per day  . [DISCONTINUED] mesalamine (LIALDA) 1.2 G EC tablet Take 2 tablets (2.4 g total) by mouth daily.   No current facility-administered medications on file prior to visit.    Imaging Review  Lumbosacral Imaging: Lumbar MR wo contrast:  Results for orders placed during the hospital encounter of 06/04/16  MR Lumbar Spine Wo Contrast   Narrative CLINICAL DATA:  Chronic low back pain with extension into both legs. No numbness or weakness. Remote injury 8 or 9 years ago. No recent injury or prior relevant surgery.  EXAM: MRI LUMBAR SPINE WITHOUT CONTRAST  TECHNIQUE: Multiplanar, multisequence MR imaging of the lumbar spine was performed. No intravenous contrast was administered.  COMPARISON:  MRI 02/08/2013.  Abdominal pelvic CT 07/01/2010.  FINDINGS: Segmentation: Conventional anatomy assumed, with the last open disc space designated L5-S1. S1 appears somewhat transitional. This numbering corresponds with the numbering previously utilized.  Alignment: There is 6 mm of anterolisthesis at L4-5. There is underlying advanced facet disease at L4-5, likely accounting for this. Integrity of the left L4 pars interarticularis is difficult to confirm, although there is no definite  pars defect as correlated with the prior studies. Overall alignment appears unchanged.  Vertebrae: No worrisome lesion or acute fracture. Chronic Schmorl's nodes in the superior endplates of J19 and L1. The visualized sacroiliac joints appear unremarkable.  Conus medullaris: Extends to the L1-2 level and appears normal.  Paraspinal and other soft tissues: No  significant paraspinal findings. Probable parapelvic cysts in the lower pole of the left kidney, similar to priors.  Disc levels:  No significant disc space findings are demonstrated from T11-12 through L1-2.  L2-3: Stable mild disc bulging and facet hypertrophy. No significant spinal stenosis or nerve root encroachment.  L3-4: Mildly progressive annular disc bulging eccentric to the left, contributing to mild narrowing of the left lateral recess. There is mild facet and ligamentous hypertrophy. The foramina are patent.  L4-5: Moderate multifactorial spinal stenosis appears mildly progressive, secondary to annular disc bulging, advanced facet and ligamentous hypertrophy and resulting grade 1 anterolisthesis. There is stable mild narrowing of the foramina and left lateral recess.  L5-S1: Chronic right paracentral disc extrusion has partially involuted without definite residual S1 nerve root encroachment. There is stable disc bulging, endplate osteophytes asymmetric to the left and mild bilateral facet hypertrophy. There is mild chronic left foraminal narrowing.  IMPRESSION: 1. Mildly progressive moderate multifactorial spinal stenosis at L4-5 secondary to advanced facet disease, annular disc bulging and a grade 1 anterolisthesis. 2. Mildly progressive annular disc bulging eccentric to the left at L3-4, narrowing the left lateral recess, but not causing definite nerve root encroachment. 3. Partial involution of right paracentral disc extrusion at L5-S1 with stable disc bulging and endplate osteophytes asymmetric to the left. 4. No acute findings.   Electronically Signed   By: Richardean Sale M.D.   On: 06/04/2016 09:42   Knee Imaging:  Knee-R DG 4 views:  Results for orders placed during the hospital encounter of 08/07/16  DG Knee Complete 4 Views Right   Narrative CLINICAL DATA:  72 year old female with lateral knee pain for 2 weeks. No known injury. Initial  encounter.  EXAM: RIGHT KNEE - COMPLETE 4+ VIEW  COMPARISON:  Right knee series 12715.  FINDINGS: Chronic lateral more so than medial compartment degenerative spurring, up to severe. Joint space loss in both compartments also appears mildly progressed since 2015. Mild patellofemoral compartment spurring. Patella intact. No joint effusion. No acute osseous abnormality identified.  IMPRESSION: Moderate to severe lateral greater than medial compartment right knee joint degeneration with mild progression since 2015. No acute osseous abnormality identified.   Electronically Signed   By: Genevie Ann M.D.   On: 08/07/2016 17:24    Knee-L DG 4 views:  Results for orders placed during the hospital encounter of 08/07/16  DG Knee Complete 4 Views Left   Narrative CLINICAL DATA:  Twisting injury. Medial knee pain. Injury 1 month ago.  EXAM: LEFT KNEE - COMPLETE 4+ VIEW  COMPARISON:  None.  FINDINGS: No joint effusion. No fracture or dislocation. Mild osteoarthritis, most pronounced in the lateral compartment with joint space narrowing and marginal osteophytes.  IMPRESSION: No acute finding. Tricompartmental osteoarthritis, most pronounced laterally.   Electronically Signed   By: Nelson Chimes M.D.   On: 08/07/2016 17:24     Note: Available results from prior imaging studies were reviewed.        ROS  Cardiovascular History: No reported cardiovascular signs or symptoms such as High blood pressure, coronary artery disease, abnormal heart rate or rhythm, heart attack, blood thinner therapy or heart weakness and/or failure Pulmonary  or Respiratory History: No reported pulmonary signs or symptoms such as wheezing and difficulty taking a deep full breath (Asthma), difficulty blowing air out (Emphysema), coughing up mucus (Bronchitis), persistent dry cough, or temporary stoppage of breathing during sleep Neurological History: No reported neurological signs or symptoms such as  seizures, abnormal skin sensations, urinary and/or fecal incontinence, being born with an abnormal open spine and/or a tethered spinal cord Review of Past Neurological Studies: No results found for this or any previous visit. Psychological-Psychiatric History: No reported psychological or psychiatric signs or symptoms such as difficulty sleeping, anxiety, depression, delusions or hallucinations (schizophrenial), mood swings (bipolar disorders) or suicidal ideations or attempts Gastrointestinal History: No reported gastrointestinal signs or symptoms such as vomiting or evacuating blood, reflux, heartburn, alternating episodes of diarrhea and constipation, inflamed or scarred liver, or pancreas or irrregular and/or infrequent bowel movements Genitourinary History: No reported renal or genitourinary signs or symptoms such as difficulty voiding or producing urine, peeing blood, non-functioning kidney, kidney stones, difficulty emptying the bladder, difficulty controlling the flow of urine, or chronic kidney disease Hematological History: No reported hematological signs or symptoms such as prolonged bleeding, low or poor functioning platelets, bruising or bleeding easily, hereditary bleeding problems, low energy levels due to low hemoglobin or being anemic Endocrine History: No reported endocrine signs or symptoms such as high or low blood sugar, rapid heart rate due to high thyroid levels, obesity or weight gain due to slow thyroid or thyroid disease Rheumatologic History: No reported rheumatological signs and symptoms such as fatigue, joint pain, tenderness, swelling, redness, heat, stiffness, decreased range of motion, with or without associated rash Musculoskeletal History: Negative for myasthenia gravis, muscular dystrophy, multiple sclerosis or malignant hyperthermia Work History: Retired  Allergies  Courtney Roth is allergic to codeine and metoprolol succinate.  Laboratory Chemistry  Inflammation  Markers No results found for: CRP, ESRSEDRATE (CRP: Acute Phase) (ESR: Chronic Phase) Renal Function Markers Lab Results  Component Value Date   BUN 24 (H) 03/19/2015   CREATININE 0.92 03/19/2015   GFRAA 65 11/24/2008   GFRNONAA 56.53 07/24/2010   Hepatic Function Markers Lab Results  Component Value Date   AST 22 03/19/2015   ALT 21 03/19/2015   ALBUMIN 4.4 03/19/2015   ALKPHOS 66 03/19/2015   Electrolytes Lab Results  Component Value Date   NA 137 03/19/2015   K 3.6 03/19/2015   CL 97 03/19/2015   CALCIUM 9.9 03/19/2015   Neuropathy Markers Lab Results  Component Value Date   VITAMINB12 357 03/31/2011   Bone Pathology Markers Lab Results  Component Value Date   ALKPHOS 66 03/19/2015   CALCIUM 9.9 03/19/2015   Coagulation Parameters Lab Results  Component Value Date   PLT 350.0 03/19/2015   Cardiovascular Markers Lab Results  Component Value Date   HGB 14.7 03/19/2015   HCT 43.6 03/19/2015   Note: Lab results reviewed.  [place]  Drug: Courtney Roth  reports that she does not use drugs. Alcohol:  reports that she drinks alcohol. Tobacco:  reports that she quit smoking about 11 years ago. she has never used smokeless tobacco. Medical:  has a past medical history of Colon polyps (03/04/2007), Dyspnea, GERD (gastroesophageal reflux disease), Hyperlipidemia, Hypertension, Obesity, Pneumonia, Stress reaction, and Ulcerative colitis. Family: family history includes Breast cancer in her cousin and maternal aunt; Clotting disorder in her mother; Crohn's disease in her sister; Diabetes in her paternal uncle; GER disease in her mother; Heart disease in her father and mother; Hypertension in her mother; Osteoporosis  in her mother.  Past Surgical History:  Procedure Laterality Date  . ruptured tubal pregnancy  1975   1 ovary removed   Active Ambulatory Problems    Diagnosis Date Noted  . VITAMIN B12 DEFICIENCY 01/08/2010  . Hyperlipidemia 03/01/2007  . Obesity  01/31/2008  . Essential hypertension 03/01/2007  . GERD 03/01/2007  . Ulcerative proctitis ([place]) 03/01/2007  . DIVERTICULITIS OF COLON 07/09/2010  . History of pancreatitis 07/09/2010  . Low back pain radiating to left leg 07/09/2010  . EDEMA LEG 11/10/2008  . DYSPNEA ON EXERTION 03/12/2007  . Personal history of colonic polyps 01/08/2010  . TOBACCO USE, QUIT 03/01/2007  . Hyperglycemia 03/31/2011  . Numbness in feet 03/31/2011  . Arm pain, anterior 12/26/2011  . Lumbar degenerative disc disease 02/08/2013  . Right knee pain 10/02/2014  . Osteoarthritis of right knee 10/03/2014  . Hearing problem 03/19/2015  . History of colonic polyps 05/29/2015  . Pain syndrome, chronic 12/14/2017  . UC (ulcerative colitis) ([place]) 10/24/2015  . Low back pain 08/09/2013  . DDD (degenerative disc disease), lumbar 10/12/2017  . Localized osteoarthritis of hands, bilateral 10/12/2017  . Osteoarthritis of both knees 10/12/2017   Resolved Ambulatory Problems    Diagnosis Date Noted  . COLONIC POLYPS, HYPERPLASTIC 03/04/2007  . STRESS REACTION, ACUTE, WITH EMOTIONAL DISTURBANCE 02/16/2008  . Itching in the vaginal area 03/31/2011  . Rash and nonspecific skin eruption 07/28/2011  . Abdominal pain 08/11/2011  . Abnormal urinalysis 08/11/2011  . Right upper quadrant abdominal pain 12/12/2011  . Right flank pain 12/12/2011  . Stress reaction 01/15/2012   Past Medical History:  Diagnosis Date  . Colon polyps 03/04/2007  . Dyspnea   . GERD (gastroesophageal reflux disease)   . Hyperlipidemia   . Hypertension   . Obesity   . Pneumonia   . Stress reaction   . Ulcerative colitis    Constitutional Exam  General appearance: Well nourished, well developed, and well hydrated. In no apparent acute distress Vitals:   01/11/18 1314  BP: (!) 143/76  Pulse: 99  Resp: 16  Temp: 98.3 F (36.8 C)  TempSrc: Oral  SpO2: 99%  Weight: 250 lb (113.4 kg)  Height: 5' (1.524 m)   BMI Assessment:  Estimated body mass index is 48.82 kg/m as calculated from the following:   Height as of this encounter: 5' (1.524 m).   Weight as of this encounter: 250 lb (113.4 kg).  BMI interpretation table: BMI level Category Range association with higher incidence of chronic pain  <18 kg/m2 Underweight   18.5-24.9 kg/m2 Ideal body weight   25-29.9 kg/m2 Overweight Increased incidence by 20%  30-34.9 kg/m2 Obese (Class I) Increased incidence by 68%  35-39.9 kg/m2 Severe obesity (Class II) Increased incidence by 136%  >40 kg/m2 Extreme obesity (Class III) Increased incidence by 254%   BMI Readings from Last 4 Encounters:  01/11/18 48.82 kg/m  09/17/15 46.77 kg/m  09/05/15 46.77 kg/m  05/29/15 46.77 kg/m   Wt Readings from Last 4 Encounters:  01/11/18 250 lb (113.4 kg)  05/29/15 235 lb 8 oz (106.8 kg)  12/26/11 226 lb 4 oz (102.6 kg)  12/12/11 221 lb 12 oz (100.6 kg)  Psych/Mental status: Alert, oriented x 3 (person, place, & time)       Eyes: PERLA Respiratory: No evidence of acute respiratory distress  Gait & Posture Assessment  Ambulation: Unassisted Gait: Antalgic Posture: WNL   Assessment  Primary Diagnosis & Pertinent Problem List: There were no encounter diagnoses.  Visit Diagnosis: No diagnosis found. Plan of Care  Initial treatment plan:  Please be advised that as per protocol, today's visit has been an evaluation only. We have not taken over the patient's controlled substance management.  Problem-specific plan: No problem-specific Assessment & Plan notes found for this encounter.  Ordered Lab-work, Procedure(s), Referral(s), & Consult(s): No orders of the defined types were placed in this encounter.  Pharmacotherapy: Medications ordered:  No orders of the defined types were placed in this encounter.  Medications administered during this visit: Gilda Crease had no medications administered during this visit.   Pharmacotherapy under consideration:   Opioid Analgesics: The patient was informed that there is no guarantee that she would be a candidate for opioid analgesics. The decision will be made following CDC guidelines. This decision will be based on the results of diagnostic studies, as well as Courtney Roth risk profile.  Membrane stabilizer: To be determined at a later time Muscle relaxant: To be determined at a later time NSAID: To be determined at a later time Other analgesic(s): To be determined at a later time   Interventional therapies under consideration: Courtney Roth was informed that there is no guarantee that she would be a candidate for interventional therapies. The decision will be based on the results of diagnostic studies, as well as Courtney Roth risk profile.  Possible procedure(s): PT did not want to have any additional appointments stated that she will continue with her current providers because there was nothing else that can be done. She does not want any surgery.    Provider-requested follow-up: No Follow-up on file.  No future appointments.  Primary Care Physician: Casilda Carls, MD Location: [facility] Note by:  Date: 01/11/2018; Time: 3:11 PM  Pain Score Disclaimer: We use the NRS-11 scale. This is a self-reported, subjective measurement of pain severity with only modest accuracy. It is used primarily to identify changes within a particular patient. It must be understood that outpatient pain scales are significantly less accurate that those used for research, where they can be applied under ideal controlled circumstances with minimal exposure to variables. In reality, the score is likely to be a combination of pain intensity and pain affect, where pain affect describes the degree of emotional arousal or changes in action readiness caused by the sensory experience of pain. Factors such as social and work situation, setting, emotional state, anxiety levels, expectation, and  prior pain experience may influence pain perception and show large inter-individual differences that may also be affected by time variables.  Patient instructions provided during this appointment: There are no Patient Instructions on file for this visit.

## 2018-01-11 NOTE — Progress Notes (Signed)
Safety precautions to be maintained throughout the outpatient stay will include: orient to surroundings, keep bed in low position, maintain call bell within reach at all times, provide assistance with transfer out of bed and ambulation.  

## 2018-04-23 ENCOUNTER — Other Ambulatory Visit: Payer: Self-pay | Admitting: Orthopedic Surgery

## 2018-04-23 DIAGNOSIS — M7582 Other shoulder lesions, left shoulder: Secondary | ICD-10-CM

## 2018-05-12 ENCOUNTER — Ambulatory Visit
Admission: RE | Admit: 2018-05-12 | Discharge: 2018-05-12 | Disposition: A | Discharge status: Home / Self Care | Payer: Medicare Other | Source: Ambulatory Visit | Attending: Orthopedic Surgery | Admitting: Orthopedic Surgery

## 2018-05-12 DIAGNOSIS — M7582 Other shoulder lesions, left shoulder: Secondary | ICD-10-CM | POA: Insufficient documentation

## 2018-05-12 DIAGNOSIS — M24812 Other specific joint derangements of left shoulder, not elsewhere classified: Secondary | ICD-10-CM | POA: Diagnosis not present

## 2018-05-12 DIAGNOSIS — M75102 Unspecified rotator cuff tear or rupture of left shoulder, not specified as traumatic: Secondary | ICD-10-CM | POA: Diagnosis not present

## 2018-06-14 ENCOUNTER — Ambulatory Visit: Payer: Medicare Other | Admitting: Cardiovascular Disease

## 2018-06-17 ENCOUNTER — Ambulatory Visit: Payer: Medicare Other | Admitting: Cardiovascular Disease

## 2018-06-22 ENCOUNTER — Encounter: Payer: Self-pay | Admitting: Cardiovascular Disease

## 2018-06-22 ENCOUNTER — Ambulatory Visit (INDEPENDENT_AMBULATORY_CARE_PROVIDER_SITE_OTHER): Payer: Medicare Other | Admitting: Cardiovascular Disease

## 2018-06-22 VITALS — BP 110/82 | HR 108 | Ht 60.0 in | Wt 253.2 lb

## 2018-06-22 DIAGNOSIS — E785 Hyperlipidemia, unspecified: Secondary | ICD-10-CM | POA: Diagnosis not present

## 2018-06-22 DIAGNOSIS — R9439 Abnormal result of other cardiovascular function study: Secondary | ICD-10-CM

## 2018-06-22 DIAGNOSIS — I10 Essential (primary) hypertension: Secondary | ICD-10-CM

## 2018-06-22 DIAGNOSIS — R0602 Shortness of breath: Secondary | ICD-10-CM

## 2018-06-22 MED ORDER — METOPROLOL TARTRATE 25 MG PO TABS: 25.0000 mg | ORAL_TABLET | Freq: Two times a day (BID) | ORAL | 1 refills | Status: DC

## 2018-06-22 NOTE — Patient Instructions (Signed)
Medication Instructions: STOP the hydrochlorothiazide START Metoprolol Tartrate 25 mg twice daily   If you need a refill on your cardiac medications before your next appointment, please call your pharmacy.   Procedures/Testing: Your physician has requested that you have an echocardiogram. Echocardiography is a painless test that uses sound waves to create images of your heart. It provides your doctor with information about the size and shape of your heart and how well your heart's chambers and valves are working. You may receive an ultrasound enhancing agent through an IV if needed to better visualize your heart during the echo.This procedure takes approximately one hour. There are no restrictions for this procedure. This will take place at the [facility] clinic.    Follow-Up: Your physician wants you to follow-up in 2 months with Dr. Fletcher Anon.   Thank you for choosing Heart[facility] at [facility]!

## 2018-06-22 NOTE — Progress Notes (Signed)
Cardiology Office Note   Date:  06/22/2018   ID:  Courtney, Roth 1946/08/18, MRN [id number]  PCP:  Casilda Carls, MD (Inactive)  Cardiologist:   Kathlyn Sacramento, MD   Chief Complaint  Patient presents with  . other    Ref by Dr. Rosario Jacks for abnormal stress test. Pt. c/o palpitations and chest pain. Meds reviewed by the pt. verbally.       History of Present Illness: Courtney Roth is a 72 y.o. female who was referred by Dr. Rosario Jacks for evaluation of chest pain and abnormal stress test.  She has no prior cardiac history.  She has chronic medical conditions that include hypertension, chronic back pain, morbid obesity, hyperlipidemia, ulcerative colitis and GERD. She had an injury to her back in 2006 and she reports that she gained 100 pounds since then.  In the recent years, she developed significant shortness of breath and intermittent episodes of chest pain described as aching sensation both at rest and with activities.  This is usually associated with palpitations.  She is a previous smoker and quit about 20 years ago.  She is not diabetic.  Family history is remarkable for coronary artery disease.  Her father had myocardial infarction in his early 4s. She underwent a treadmill nuclear stress test with Dr.Jadali.  She was able to exercise for only 2 minutes and 13 seconds.  She achieved 85% maximal predicted heart rate.  There was moderate apical and inferoseptal defect which was reversible and suggestive of ischemia.   Past Medical History:  Diagnosis Date  . Colon polyps 03/04/2007   History of Hyperplastic  . Dyspnea   . GERD (gastroesophageal reflux disease)   . Hyperlipidemia   . Hypertension   . Obesity   . Pneumonia   . Stress reaction   . Ulcerative colitis     Past Surgical History:  Procedure Laterality Date  . ruptured tubal pregnancy  1975   1 ovary removed     Current Outpatient Medications  Medication Sig Dispense Refill  .  acetaminophen (TYLENOL) 500 MG tablet Take 1,000 mg by mouth every 6 (six) hours as needed for mild pain (patient takes 1 - 2 depending on activity level).    Marland Kitchen aspirin 81 MG tablet Take 81 mg by mouth daily.      Marland Kitchen CALCIUM-VITAMIN D PO Take 1 tablet by mouth daily.      . Cholecalciferol (VITAMIN D3) 1000 UNITS CAPS Take 1 capsule by mouth daily.     Marland Kitchen levothyroxine (SYNTHROID) 75 MCG tablet Take 1 tablet by mouth daily.    Marland Kitchen lisinopril (PRINIVIL,ZESTRIL) 5 MG tablet TAKE 1 TABLET (5 MG TOTAL) BY MOUTH DAILY. 30 tablet 11  . MAGNESIUM ASPARTATE PO Take 1 tablet by mouth daily.      . naproxen sodium (ALEVE) 220 MG tablet Take 220 mg by mouth 3 (three) times daily as needed.      . simvastatin (ZOCOR) 40 MG tablet TAKE 1 TABLET (40 MG TOTAL) BY MOUTH AT BEDTIME. 30 tablet 11  . sulfaSALAzine (AZULFIDINE) 500 MG tablet Take 2 tablets (1,000 mg total) by mouth 2 (two) times daily. 120 tablet 3  . traMADol (ULTRAM) 50 MG tablet Take 1-2 tablets by mouth 3 (three) times daily. Patient states she only takes 3 per day  1   No current facility-administered medications for this visit.     Allergies:   Codeine; Metoprolol succinate; and Other    Social History:  The  patient  reports that she quit smoking about 11 years ago. Her smoking use included cigarettes. She has never used smokeless tobacco. She reports that she drinks alcohol. She reports that she does not use drugs.   Family History:  The patient's family history includes Breast cancer in her cousin and maternal aunt; Clotting disorder in her mother; Crohn's disease in her sister; Diabetes in her paternal uncle; GER disease in her mother; Heart disease in her father and mother; Hypertension in her mother; Osteoporosis in her mother.    ROS:  Please see the history of present illness.   Otherwise, review of systems are positive for none.   All other systems are reviewed and negative.    PHYSICAL EXAM: VS:  BP 110/82 (BP Location: Right Arm,  Patient Position: Sitting, Cuff Size: Normal)   Pulse (!) 108   Ht 5' (1.524 m)   Wt 253 lb 4 oz (114.9 kg)   BMI 49.46 kg/m  , BMI Body mass index is 49.46 kg/m. GEN: Well nourished, well developed, in no acute distress  HEENT: normal  Neck: no JVD, carotid bruits, or masses Cardiac: RRR; no murmurs, rubs, or gallops,no edema  Respiratory:  clear to auscultation bilaterally, normal work of breathing GI: soft, nontender, nondistended, + BS MS: no deformity or atrophy  Skin: warm and dry, no rash Neuro:  Strength and sensation are intact Psych: euthymic mood, full affect   EKG:  EKG is ordered today. The ekg ordered today demonstrates sinus tachycardia with possible left atrial enlargement nonspecific T wave changes.   Recent Labs: No results found for requested labs within last 8760 hours.    Lipid Panel    Component Value Date/Time   CHOL 195 03/19/2015 1237   TRIG 137.0 03/19/2015 1237   HDL 46.90 03/19/2015 1237   CHOLHDL 4 03/19/2015 1237   VLDL 27.4 03/19/2015 1237   LDLCALC 121 (H) 03/19/2015 1237   LDLDIRECT 155.2 11/30/2013 1224      Wt Readings from Last 3 Encounters:  06/22/18 253 lb 4 oz (114.9 kg)  01/11/18 250 lb (113.4 kg)  05/29/15 235 lb 8 oz (106.8 kg)       PAD Screen 06/22/2018  Previous PAD dx? No  Previous surgical procedure? No  Pain with walking? No  Feet/toe relief with dangling? No  Painful, non-healing ulcers? No  Extremities discolored? No      ASSESSMENT AND PLAN:  1.  Atypical chest pain and exertional dyspnea with abnormal nuclear stress test: I discussed with the patient different management options including proceeding with cardiac catheterization and possible PCI.  After discussing the procedure in details as well as risks and benefits, she became extremely worried about the possible risks and she prefers to be treated medically first before considering to proceed with this.  Continue low-dose aspirin. I do think that  resting tachycardia might be contributing to some of her symptoms.  It is listed that she did not tolerate metoprolol in 2008 due to shortness of breath and fatigue.  However, the patient does not remember that. I elected to start her on metoprolol 25 mg twice daily. I am going to obtain an echocardiogram to evaluate diastolic function and pulmonary pressure. The patient will think about the option of proceeding with cardiac catheterization if her symptoms worsen.  2.  Essential hypertension: Given the addition of metoprolol and the fact that her blood pressure is somewhat on the low side, I elected to discontinue hydrochlorothiazide.  3.  Hyperlipidemia: Continue  treatment with simvastatin with a target LDL of less than 70.  4.  Morbid obesity: I suspect that this is likely contributing to some of her symptoms of shortness of breath given obvious physical deconditioning.  The patient unfortunately is not able to exercise due to back pain.    Disposition:   FU with me in 2 months  Signed,  Kathlyn Sacramento, MD  06/22/2018 1:40 PM    [place]

## 2018-06-25 ENCOUNTER — Other Ambulatory Visit: Payer: Self-pay

## 2018-06-25 ENCOUNTER — Ambulatory Visit (INDEPENDENT_AMBULATORY_CARE_PROVIDER_SITE_OTHER): Payer: Medicare Other

## 2018-06-25 DIAGNOSIS — R0602 Shortness of breath: Secondary | ICD-10-CM | POA: Diagnosis not present

## 2018-07-06 ENCOUNTER — Telehealth: Payer: Self-pay | Admitting: Cardiovascular Disease

## 2018-07-06 NOTE — Telephone Encounter (Signed)
Patient calling stating she would like a call back She states she has three BP medications  She is not sure out of the two which one she's not to take  Metoprolol and Lisinopril and Hydrocholorochiazide    Please advise which one she is not to take

## 2018-07-06 NOTE — Telephone Encounter (Signed)
I spoke with the patient. I advised her that according to Dr. Tyrell Antonio last office note (06/22/18) she was to stop hydrochlorothiazide. The patient voices understanding and is agreeable.

## 2018-08-02 IMAGING — MR MR SHOULDER*L* W/O CM
5 series · 40 of 40 positions shown · non-contrast
Comparison: None.

CLINICAL DATA: Chronic left shoulder pain.

EXAM:
MRI OF THE LEFT SHOULDER WITHOUT CONTRAST
TECHNIQUE: Multiplanar, multisequence MR imaging of the shoulder was performed.
No intravenous contrast was administered.

[Series 3: T2 fat-sat · axial · 4.0mm · 0.55mm/px · z∈[-51,+40]mm · 10 of 22 slices shown (1 of 3)]
[im 1/22]
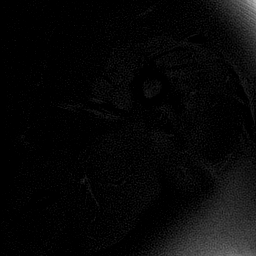
[im 3/22]
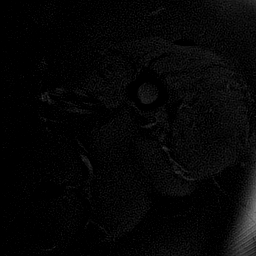
[im 5/22]
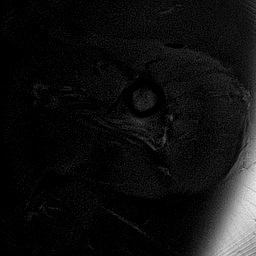
[im 8/22]
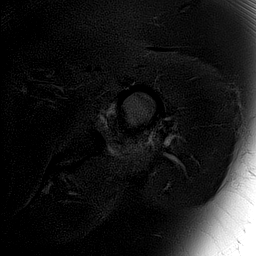
[im 10/22]
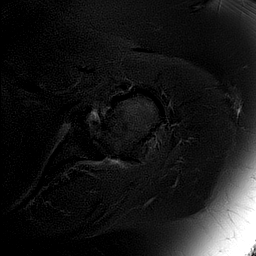
[im 12/22]
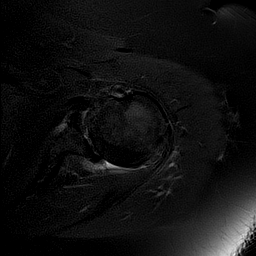
[im 15/22]
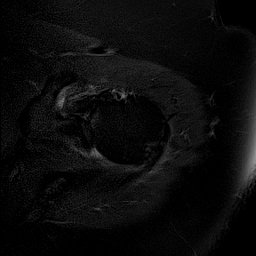
[im 17/22]
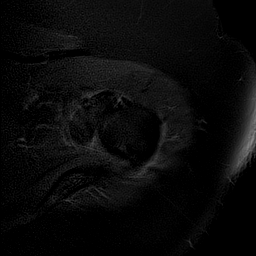
[im 19/22]
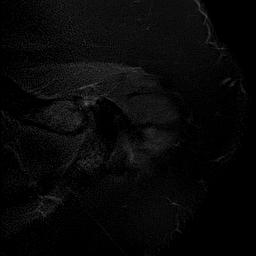
[im 22/22]
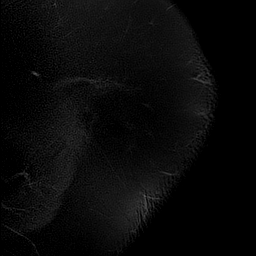

[Series 4: T2 fat-sat · coronal · 4.0mm · 0.62mm/px · 7 of 17 slices shown (2 of 3)]
[im 1/17]
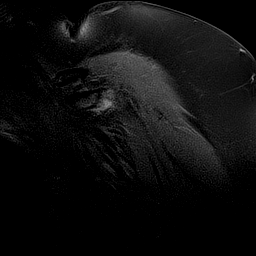
[im 3/17]
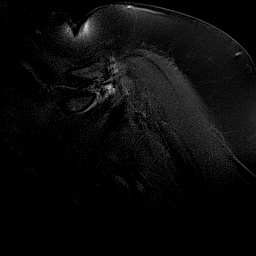
[im 6/17]
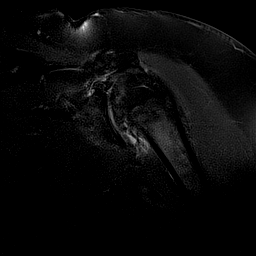
[im 9/17]
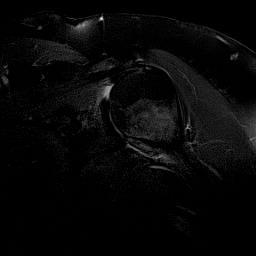
[im 11/17]
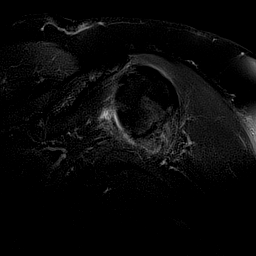
[im 14/17]
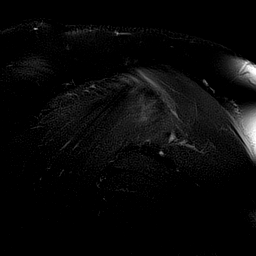
[im 17/17]
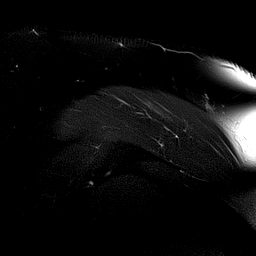

[Series 5: PD · coronal · 4.0mm · 0.62mm/px · 7 of 17 slices shown]
[im 1/17]
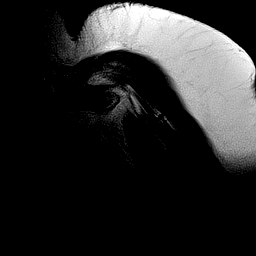
[im 3/17]
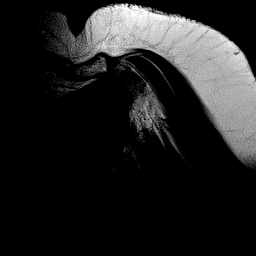
[im 6/17]
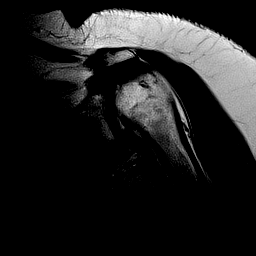
[im 9/17]
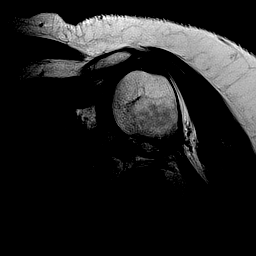
[im 11/17]
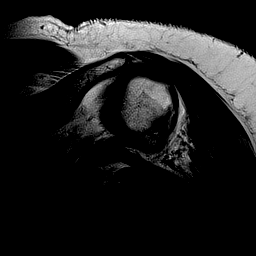
[im 14/17]
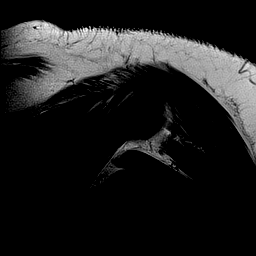
[im 17/17]
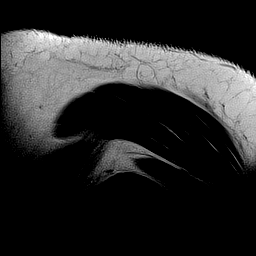

[Series 6: T1 · sagittal · 4.0mm · 0.55mm/px · 8 of 20 slices shown]
[im 1/20]
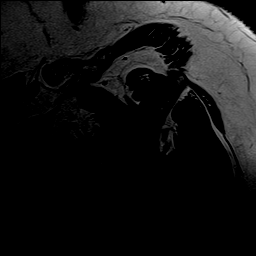
[im 3/20]
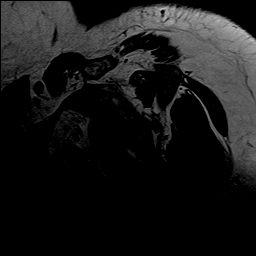
[im 6/20]
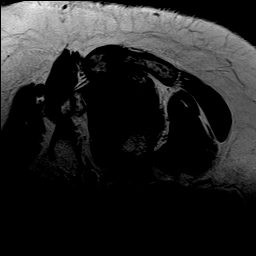
[im 9/20]
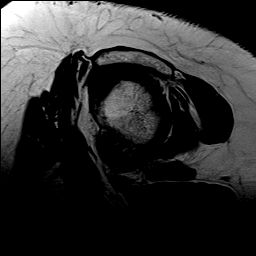
[im 11/20]
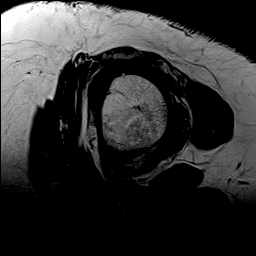
[im 14/20]
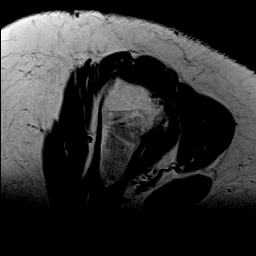
[im 17/20]
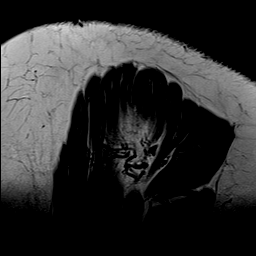
[im 20/20]
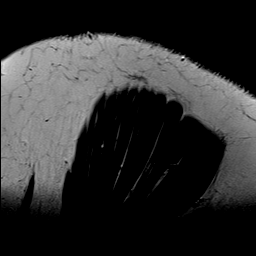

[Series 7: T2 fat-sat · sagittal · 4.0mm · 0.55mm/px · 8 of 20 slices shown (3 of 3)]
[im 1/20]
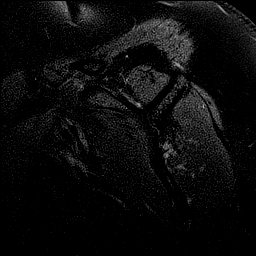
[im 3/20]
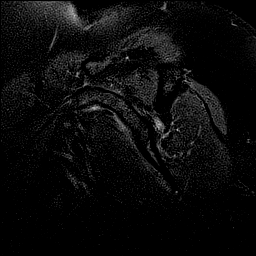
[im 6/20]
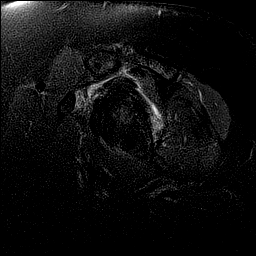
[im 9/20]
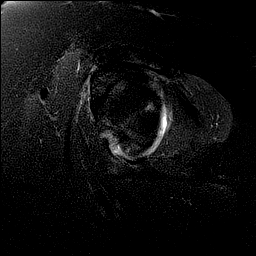
[im 11/20]
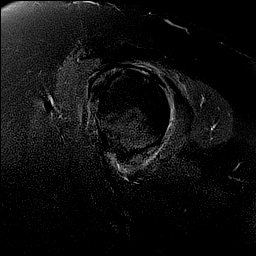
[im 14/20]
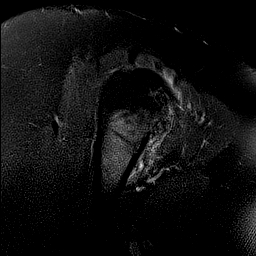
[im 17/20]
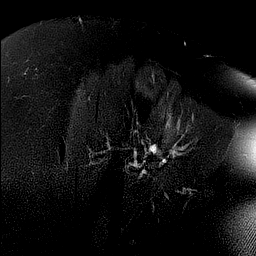
[im 20/20]
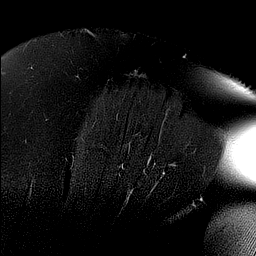

[40 of 40 positions shown; findings below may reference images not displayed]

FINDINGS: Limited examination due to body habitus and patient motion.

Rotator cuff: Full-thickness retracted tear involving the anterior
aspect of the supraspinatus tendon. The upper fibers of the
subscapularis tendon are also torn. The infraspinatus tendon is
grossly intact. Moderate tendinopathy.

Muscles:  Mild fatty atrophy of the supraspinatus muscle.

Biceps long head: Severe tendinopathy involving the intra-articular
portion of the long head biceps tendon.

Acromioclavicular Joint: Moderate mild to moderate degenerative
changes type 2 acromion. No lateral downsloping or undersurface
spurring.

Glenohumeral Joint: Severe degenerative changes with joint space
narrowing, cartilage loss, joint effusion and extensive spurring,
particularly off of the humeral head.

Labrum:  Degenerated but no obvious tear.

Bones:  No acute bony findings

Other: Expected fluid in the subacromial/subdeltoid bursa.
IMPRESSION: 1. Limited examination due to body habitus and motion.
2. Full-thickness retracted tear involving the anterior aspect of
the supraspinatus tendon and the upper fibers of the subscapularis
tendon.
3. Severe long head biceps tendinopathy.
4. Severe glenohumeral joint degenerative changes.
5. No significant findings for bony impingement.

## 2018-08-31 ENCOUNTER — Encounter: Payer: Self-pay | Admitting: Cardiovascular Disease

## 2018-08-31 ENCOUNTER — Ambulatory Visit (INDEPENDENT_AMBULATORY_CARE_PROVIDER_SITE_OTHER): Payer: Medicare Other | Admitting: Cardiovascular Disease

## 2018-08-31 VITALS — BP 172/88 | HR 63 | Ht 60.0 in | Wt 249.2 lb

## 2018-08-31 DIAGNOSIS — I10 Essential (primary) hypertension: Secondary | ICD-10-CM

## 2018-08-31 DIAGNOSIS — E785 Hyperlipidemia, unspecified: Secondary | ICD-10-CM

## 2018-08-31 DIAGNOSIS — R9439 Abnormal result of other cardiovascular function study: Secondary | ICD-10-CM | POA: Diagnosis not present

## 2018-08-31 NOTE — Progress Notes (Signed)
Cardiology Office Note   Date:  08/31/2018   ID:  Jullianna, Gabor [birth year], MRN [id number]  PCP:  Casilda Carls, MD  Cardiologist:   Kathlyn Sacramento, MD   Chief Complaint  Patient presents with  . other    F/u echo c/o sob. Meds reviewed verbally with pt.      History of Present Illness: Courtney Roth is a 72 y.o. female who is here today for follow-up visit regarding abnormal nuclear stress test.  She has no prior cardiac history.  She has chronic medical conditions that include hypertension, chronic back pain, morbid obesity, hyperlipidemia, ulcerative colitis and GERD. She is a previous smoker and quit about 20 years ago. She had an injury to her back in 2006 and she reports that she gained 100 pounds since then.  She was seen few months ago for  shortness of breath and intermittent episodes of chest pain described as aching sensation both at rest and with activities.    She underwent a treadmill nuclear stress test with Dr.Jadali.  She was able to exercise for only 2 minutes and 13 seconds.  She achieved 85% maximal predicted heart rate.  There was moderate apical and anteroseptal defect which was reversible and suggestive of ischemia. I recommended proceeding with cardiac catheterization but the patient declined.  She was noted to have resting tachycardia and I added metoprolol 25 mg twice daily.  She had an echocardiogram done which showed an EF of 50 to 55% with grade 1 diastolic dysfunction and mild mitral regurgitation.  Pulmonary pressure could not be estimated.  She reports improvement in shortness of breath and palpitations since the last visit.  No recent chest pain.  Past Medical History:  Diagnosis Date  . Colon polyps 03/04/2007   History of Hyperplastic  . Dyspnea   . GERD (gastroesophageal reflux disease)   . Hyperlipidemia   . Hypertension   . Obesity   . Pneumonia   . Stress reaction   . Ulcerative colitis     Past Surgical History:    Procedure Laterality Date  . ruptured tubal pregnancy  1975   1 ovary removed     Current Outpatient Medications  Medication Sig Dispense Refill  . acetaminophen (TYLENOL) 500 MG tablet Take 1,000 mg by mouth every 6 (six) hours as needed for mild pain (patient takes 1 - 2 depending on activity level).    Marland Kitchen aspirin 81 MG tablet Take 81 mg by mouth daily.      Marland Kitchen CALCIUM-VITAMIN D PO Take 1 tablet by mouth daily.      . Cholecalciferol (VITAMIN D3) 1000 UNITS CAPS Take 1 capsule by mouth daily.     Marland Kitchen levothyroxine (SYNTHROID) 75 MCG tablet Take 1 tablet by mouth daily.    Marland Kitchen lisinopril (PRINIVIL,ZESTRIL) 5 MG tablet TAKE 1 TABLET (5 MG TOTAL) BY MOUTH DAILY. 30 tablet 11  . metoprolol tartrate (LOPRESSOR) 25 MG tablet Take 1 tablet (25 mg total) by mouth 2 (two) times daily. 180 tablet 1  . naproxen sodium (ALEVE) 220 MG tablet Take 220 mg by mouth 3 (three) times daily as needed.      . simvastatin (ZOCOR) 40 MG tablet TAKE 1 TABLET (40 MG TOTAL) BY MOUTH AT BEDTIME. 30 tablet 11  . sulfaSALAzine (AZULFIDINE) 500 MG tablet Take 2 tablets (1,000 mg total) by mouth 2 (two) times daily. 120 tablet 3  . traMADol (ULTRAM) 50 MG tablet Take 1-2 tablets by mouth 3 (three) times  daily. Patient states she only takes 3 per day  1   No current facility-administered medications for this visit.     Allergies:   Codeine; Metoprolol succinate; and Other    Social History:  The patient  reports that she quit smoking about 11 years ago. Her smoking use included cigarettes. She has never used smokeless tobacco. She reports that she drinks alcohol. She reports that she does not use drugs.   Family History:  The patient's family history includes Breast cancer in her cousin and maternal aunt; Clotting disorder in her mother; Crohn's disease in her sister; Diabetes in her paternal uncle; GER disease in her mother; Heart disease in her father and mother; Hypertension in her mother; Osteoporosis in her mother.     ROS:  Please see the history of present illness.   Otherwise, review of systems are positive for none.   All other systems are reviewed and negative.    PHYSICAL EXAM: VS:  BP (!) 172/88 (BP Location: Right Wrist, Patient Position: Sitting, Cuff Size: Normal)   Pulse 63   Ht 5' (1.524 m)   Wt 249 lb 4 oz (113.1 kg)   BMI 48.68 kg/m  , BMI Body mass index is 48.68 kg/m. GEN: Well nourished, well developed, in no acute distress  HEENT: normal  Neck: no JVD, carotid bruits, or masses Cardiac: RRR; no murmurs, rubs, or gallops,no edema  Respiratory:  clear to auscultation bilaterally, normal work of breathing GI: soft, nontender, nondistended, + BS MS: no deformity or atrophy  Skin: warm and dry, no rash Neuro:  Strength and sensation are intact Psych: euthymic mood, full affect   EKG:  EKG is ordered today. The ekg ordered today demonstrates normal sinus rhythm with low voltage.   Recent Labs: No results found for requested labs within last 8760 hours.    Lipid Panel    Component Value Date/Time   CHOL 195 03/19/2015 1237   TRIG 137.0 03/19/2015 1237   HDL 46.90 03/19/2015 1237   CHOLHDL 4 03/19/2015 1237   VLDL 27.4 03/19/2015 1237   LDLCALC 121 (H) 03/19/2015 1237   LDLDIRECT 155.2 11/30/2013 1224      Wt Readings from Last 3 Encounters:  08/31/18 249 lb 4 oz (113.1 kg)  06/22/18 253 lb 4 oz (114.9 kg)  01/11/18 250 lb (113.4 kg)       PAD Screen 06/22/2018  Previous PAD dx? No  Previous surgical procedure? No  Pain with walking? No  Feet/toe relief with dangling? No  Painful, non-healing ulcers? No  Extremities discolored? No      ASSESSMENT AND PLAN:  1.  Atypical chest pain and exertional dyspnea with abnormal nuclear stress test: Possible underlying coronary artery disease but false positive nuclear stress test is a strong possibility given morbid obesity.  Regardless, her symptoms have improved overall and I recommend continuing medical  therapy with aspirin, metoprolol and a statin.  2.  Essential hypertension: Blood pressure is elevated today but usually her pressure is on the low side.  Hydrochlorothiazide was discontinued during last visit given the addition of metoprolol.  If blood pressure continues to be elevated, I recommend increasing the dose of lisinopril.  3.  Hyperlipidemia: Continue treatment with simvastatin with a target LDL of less than 70.  4.  Morbid obesity: She wants to start the keto diet and lose some weight.   Disposition:   FU with me in 6 months  Signed,  Kathlyn Sacramento, MD  08/31/2018 2:43 PM  [facility]

## 2018-08-31 NOTE — Patient Instructions (Signed)
Medication Instructions:  No changes   If you need a refill on your cardiac medications before your next appointment, please call your pharmacy.   Lab work: None ordered.  Testing/Procedures: No ordered  Follow-Up: At [facility], you and your health needs are our priority.  As part of our continuing mission to provide you with exceptional heart care, we have created designated Provider Care Teams.  These Care Teams include your primary Cardiologist (physician) and Advanced Practice Providers (APPs -  Physician Assistants and Nurse Practitioners) who all work together to provide you with the care you need, when you need it. You will need a follow up appointment in 6 months.  Please call our office 2 months in advance to schedule this appointment.  You may see Dr. Fletcher Anon or one of the following Advanced Practice Providers on your designated Care Team:   Murray Hodgkins, NP Christell Faith, PA-C . Marrianne Mood, PA-C

## 2018-09-15 ENCOUNTER — Telehealth: Payer: Self-pay | Admitting: Cardiovascular Disease

## 2018-09-15 NOTE — Telephone Encounter (Signed)
Pt c/o medication issue:  1. Name of Medication: Metoprolol  2. How are you currently taking this medication (dosage and times per day)? 25 mg twice a day  3. Are you having a reaction (difficulty breathing--STAT)? no  4. What is your medication issue? Pt asks if she is still supposed to be taking this medicaion

## 2018-09-15 NOTE — Telephone Encounter (Signed)
Patient has been made aware to continue with her Metoprolol. She verbalized her understanding.

## 2018-11-10 ENCOUNTER — Encounter: Payer: Self-pay | Admitting: Internal Medicine

## 2018-11-11 ENCOUNTER — Telehealth: Payer: Self-pay

## 2018-11-11 ENCOUNTER — Other Ambulatory Visit: Payer: Self-pay

## 2018-11-11 DIAGNOSIS — Z1211 Encounter for screening for malignant neoplasm of colon: Secondary | ICD-10-CM

## 2018-11-11 MED ORDER — NA SULFATE-K SULFATE-MG SULF 17.5-3.13-1.6 GM/177ML PO SOLN: 1.0000 | Freq: Once | ORAL | 0 refills | Status: AC

## 2018-11-11 NOTE — Telephone Encounter (Signed)
Pt is calling to schedule colonoscopy

## 2018-11-11 NOTE — Telephone Encounter (Signed)
Colonoscopy has been scheduled for 11/19/18 at [facility] with Dr. Bonna Gains.  Thanks [employer]

## 2018-11-19 ENCOUNTER — Encounter
Admission: RE | Disposition: A | Discharge status: Home / Self Care | Payer: Self-pay | Source: Home / Self Care | Attending: Gastroenterology

## 2018-11-19 ENCOUNTER — Ambulatory Visit
Admission: RE | Admit: 2018-11-19 | Discharge: 2018-11-19 | Disposition: A | Discharge status: Home / Self Care | Payer: Medicare Other | Attending: Gastroenterology | Admitting: Gastroenterology

## 2018-11-19 ENCOUNTER — Ambulatory Visit: Payer: Medicare Other | Admitting: Certified Registered"

## 2018-11-19 ENCOUNTER — Telehealth: Payer: Self-pay | Admitting: Gastroenterology

## 2018-11-19 ENCOUNTER — Encounter: Payer: Self-pay | Admitting: *Deleted

## 2018-11-19 DIAGNOSIS — Z1211 Encounter for screening for malignant neoplasm of colon: Secondary | ICD-10-CM | POA: Insufficient documentation

## 2018-11-19 DIAGNOSIS — D120 Benign neoplasm of cecum: Secondary | ICD-10-CM | POA: Diagnosis not present

## 2018-11-19 DIAGNOSIS — Z86010 Personal history of colonic polyps: Secondary | ICD-10-CM | POA: Diagnosis not present

## 2018-11-19 DIAGNOSIS — Z79899 Other long term (current) drug therapy: Secondary | ICD-10-CM | POA: Diagnosis not present

## 2018-11-19 DIAGNOSIS — E785 Hyperlipidemia, unspecified: Secondary | ICD-10-CM | POA: Insufficient documentation

## 2018-11-19 DIAGNOSIS — Z7982 Long term (current) use of aspirin: Secondary | ICD-10-CM | POA: Insufficient documentation

## 2018-11-19 DIAGNOSIS — I10 Essential (primary) hypertension: Secondary | ICD-10-CM | POA: Insufficient documentation

## 2018-11-19 DIAGNOSIS — Z79890 Hormone replacement therapy (postmenopausal): Secondary | ICD-10-CM | POA: Insufficient documentation

## 2018-11-19 DIAGNOSIS — K648 Other hemorrhoids: Secondary | ICD-10-CM | POA: Insufficient documentation

## 2018-11-19 DIAGNOSIS — D122 Benign neoplasm of ascending colon: Secondary | ICD-10-CM

## 2018-11-19 DIAGNOSIS — Z87891 Personal history of nicotine dependence: Secondary | ICD-10-CM | POA: Insufficient documentation

## 2018-11-19 DIAGNOSIS — K635 Polyp of colon: Secondary | ICD-10-CM | POA: Diagnosis not present

## 2018-11-19 HISTORY — PX: COLONOSCOPY WITH PROPOFOL: SHX5780

## 2018-11-19 SURGERY — COLONOSCOPY WITH PROPOFOL
Anesthesia: General

## 2018-11-19 MED ORDER — PROPOFOL 10 MG/ML IV BOLUS
INTRAVENOUS | Status: AC
  Filled 2018-11-19: qty 20

## 2018-11-19 MED ORDER — SODIUM CHLORIDE 0.9 % IV SOLN
INTRAVENOUS | Status: AC | PRN
  Administered 2018-11-19: 3 mL via INTRAMUSCULAR

## 2018-11-19 MED ORDER — PHENYLEPHRINE HCL 10 MG/ML IJ SOLN
INTRAMUSCULAR | Status: AC
  Filled 2018-11-19: qty 1

## 2018-11-19 MED ORDER — SODIUM CHLORIDE 0.9 % IV SOLN
INTRAVENOUS | Status: DC
  Administered 2018-11-19: 10:00:00 via INTRAVENOUS

## 2018-11-19 MED ORDER — PROPOFOL 500 MG/50ML IV EMUL
INTRAVENOUS | Status: AC
  Filled 2018-11-19: qty 50

## 2018-11-19 MED ORDER — LIDOCAINE HCL (CARDIAC) PF 100 MG/5ML IV SOSY
PREFILLED_SYRINGE | INTRAVENOUS | Status: DC | PRN
  Administered 2018-11-19: 50 mg via INTRAVENOUS

## 2018-11-19 MED ORDER — PROPOFOL 500 MG/50ML IV EMUL
INTRAVENOUS | Status: DC | PRN
  Administered 2018-11-19: 100 ug/kg/min via INTRAVENOUS

## 2018-11-19 NOTE — Telephone Encounter (Signed)
-----   Message from Virgel Manifold, MD sent at 11/19/2018 12:32 PM EST -----  Please set up clinic new patient appointment with Dr. Marius Ditch for history of ulcerative colitis.

## 2018-11-19 NOTE — H&P (Addendum)
Vonda Antigua, MD [address] [place], [place], [place], [address] [address], [place], [place], [place], [address] Phone: [phone]  Fax: [phone]  Primary Care Physician:  Casilda Carls, MD   Pre-Procedure History & Physical: HPI:  Courtney Roth is a 73 y.o. female is here for a colonoscopy.   Past Medical History:  Diagnosis Date  . Colon polyps 03/04/2007   History of Hyperplastic  . Dyspnea   . GERD (gastroesophageal reflux disease)   . Hyperlipidemia   . Hypertension   . Obesity   . Pneumonia   . Stress reaction   . Ulcerative colitis     Past Surgical History:  Procedure Laterality Date  . ruptured tubal pregnancy  1975   1 ovary removed    Prior to Admission medications   Medication Sig Start Date End Date Taking? Authorizing Provider  aspirin 81 MG tablet Take 81 mg by mouth daily.     Yes [provider]  levothyroxine (SYNTHROID) 75 MCG tablet Take 1 tablet by mouth daily. 09/03/17  Yes [provider]  lisinopril (PRINIVIL,ZESTRIL) 5 MG tablet TAKE 1 TABLET (5 MG TOTAL) BY MOUTH DAILY. 03/19/15  Yes Tower, Wynelle Fanny, MD  metoprolol tartrate (LOPRESSOR) 25 MG tablet Take 1 tablet (25 mg total) by mouth 2 (two) times daily. 06/22/18 11/19/18 Yes Wellington Hampshire, MD  simvastatin (ZOCOR) 40 MG tablet TAKE 1 TABLET (40 MG TOTAL) BY MOUTH AT BEDTIME. 03/19/15  Yes Tower, Wynelle Fanny, MD  sulfaSALAzine (AZULFIDINE) 500 MG tablet Take 2 tablets (1,000 mg total) by mouth 2 (two) times daily. 03/22/15  Yes Inda Castle, MD  acetaminophen (TYLENOL) 500 MG tablet Take 1,000 mg by mouth every 6 (six) hours as needed for mild pain (patient takes 1 - 2 depending on activity level).    [provider]  CALCIUM-VITAMIN D PO Take 1 tablet by mouth daily.      [provider]  Cholecalciferol (VITAMIN D3) 1000 UNITS CAPS Take 1 capsule by mouth daily.     [provider]  naproxen sodium (ALEVE) 220 MG tablet Take  220 mg by mouth 3 (three) times daily as needed.      [provider]  traMADol (ULTRAM) 50 MG tablet Take 1-2 tablets by mouth 3 (three) times daily. Patient states she only takes 3 per day 12/30/17   [provider]  mesalamine (LIALDA) 1.2 G EC tablet Take 2 tablets (2.4 g total) by mouth daily. 10/31/11 06/22/18  Inda Castle, MD    Allergies as of 11/12/2018 - Review Complete 08/31/2018  Allergen Reaction Noted  . Codeine Other (See Comments) 09/24/2009  . Metoprolol succinate  03/12/2007  . Other  06/22/2018    Family History  Problem Relation Age of Onset  . Heart disease Mother   . Hypertension Mother   . GER disease Mother   . Osteoporosis Mother   . Clotting disorder Mother   . Heart disease Father   . Crohn's disease Sister   . Breast cancer Maternal Aunt   . Breast cancer Cousin   . Diabetes Paternal Uncle   . Colon cancer Neg Hx     Social History   Socioeconomic History  . Marital status: Married    Spouse name: Not on file  . Number of children: Not on file  . Years of education: Not on file  . Highest education level: Not on file  Occupational History  . Not on file  Social  Needs  . Financial resource strain: Not on file  . Food insecurity:    Worry: Not on file    Inability: Not on file  . Transportation needs:    Medical: Not on file    Non-medical: Not on file  Tobacco Use  . Smoking status: Former Smoker    Types: Cigarettes    Last attempt to quit: 10/27/2006    Years since quitting: 12.0  . Smokeless tobacco: Never Used  Substance and Sexual Activity  . Alcohol use: Yes    Alcohol/week: 0.0 standard drinks    Comment: rare  . Drug use: No  . Sexual activity: Not on file  Lifestyle  . Physical activity:    Days per week: Not on file    Minutes per session: Not on file  . Stress: Not on file  Relationships  . Social connections:    Talks on phone: Not on file    Gets together: Not on file    Attends religious  service: Not on file    Active member of club or organization: Not on file    Attends meetings of clubs or organizations: Not on file    Relationship status: Not on file  . Intimate partner violence:    Fear of current or ex partner: Not on file    Emotionally abused: Not on file    Physically abused: Not on file    Forced sexual activity: Not on file  Other Topics Concern  . Not on file  Social History Narrative  . Not on file    Review of Systems: See HPI, otherwise negative ROS  Physical Exam: BP 116/78   Pulse (!) 127 Comment: sinus tach, Dr. Randa Lynn notified  Temp (!) 96.7 F (35.9 C) (Tympanic)   Resp 20   Ht 5' (1.524 m)   Wt 110.7 kg   SpO2 97%   BMI 47.65 kg/m  General:   Alert,  pleasant and cooperative in NAD Head:  Normocephalic and atraumatic. Neck:  Supple; no masses or thyromegaly. Lungs:  Clear throughout to auscultation, normal respiratory effort.    Heart:  +S1, +S2, Regular rate and rhythm, No edema. Abdomen:  Soft, nontender and nondistended. Normal bowel sounds, without guarding, and without rebound.   Neurologic:  Alert and  oriented x4;  grossly normal neurologically.  Impression/Plan: Courtney Roth is here for a colonoscopy to be performed for polyp surveillance. Last colonoscopy in 2011 with 31m serrated adenoma removed from rectum. Repeat recommended in 1 yr as per procedure report but pt did not have repeat in 1 year.   Risks, benefits, limitations, and alternatives regarding  colonoscopy have been reviewed with the patient.  Questions have been answered.  All parties agreeable.   VVirgel Manifold MD  11/19/2018, 10:47 AM

## 2018-11-19 NOTE — Transfer of Care (Signed)
Immediate Anesthesia Transfer of Care Note  Patient: Courtney Roth  Procedure(s) Performed: COLONOSCOPY WITH PROPOFOL (N/A )  Patient Location: PACU and Endoscopy Unit  Anesthesia Type:MAC  Level of Consciousness: awake  Airway & Oxygen Therapy: Patient Spontanous Breathing  Post-op Assessment: Report given to RN  Post vital signs: Reviewed  Last Vitals:  Vitals Value Taken Time  BP 129/103 11/19/2018 11:45 AM  Temp    Pulse 82 11/19/2018 11:48 AM  Resp 20 11/19/2018 11:48 AM  SpO2 99 % 11/19/2018 11:48 AM  Vitals shown include unvalidated device data.  Last Pain:  Vitals:   11/19/18 1130  TempSrc: Tympanic         Complications: No apparent anesthesia complications

## 2018-11-19 NOTE — Telephone Encounter (Signed)
Left vm for pt to call office and schedule apt forDr. Marius Ditch for history of ulcerative colitis.

## 2018-11-19 NOTE — Anesthesia Preprocedure Evaluation (Addendum)
Anesthesia Evaluation  Patient identified by MRN, date of birth, ID band Patient awake    Reviewed: Allergy & Precautions, NPO status , Patient's Chart, lab work & pertinent test results  History of Anesthesia Complications Negative for: history of anesthetic complications  Airway Mallampati: III  TM Distance: >3 FB Neck ROM: Full    Dental no notable dental hx.    Pulmonary neg sleep apnea, neg COPD, former smoker,    breath sounds clear to auscultation- rhonchi (-) wheezing      Cardiovascular hypertension, Pt. on medications (-) CAD, (-) Past MI, (-) Cardiac Stents and (-) CABG  Rhythm:Regular Rate:Normal - Systolic murmurs and - Diastolic murmurs    Neuro/Psych neg Seizures PSYCHIATRIC DISORDERS Anxiety negative neurological ROS     GI/Hepatic Neg liver ROS, PUD, GERD  ,  Endo/Other  negative endocrine ROSneg diabetes  Renal/GU negative Renal ROS     Musculoskeletal  (+) Arthritis ,   Abdominal (+) + obese,   Peds  Hematology negative hematology ROS (+)   Anesthesia Other Findings Past Medical History: 03/04/2007: Colon polyps     Comment:  History of Hyperplastic No date: Dyspnea No date: GERD (gastroesophageal reflux disease) No date: Hyperlipidemia No date: Hypertension No date: Obesity No date: Pneumonia No date: Stress reaction No date: Ulcerative colitis   Reproductive/Obstetrics                             Anesthesia Physical Anesthesia Plan  ASA: III  Anesthesia Plan: General   Post-op Pain Management:    Induction: Intravenous  PONV Risk Score and Plan: 2 and Propofol infusion  Airway Management Planned: Natural Airway  Additional Equipment:   Intra-op Plan:   Post-operative Plan:   Informed Consent: I have reviewed the patients History and Physical, chart, labs and discussed the procedure including the risks, benefits and alternatives for the proposed  anesthesia with the patient or authorized representative who has indicated his/her understanding and acceptance.     Dental advisory given  Plan Discussed with: CRNA and Anesthesiologist  Anesthesia Plan Comments:         Anesthesia Quick Evaluation

## 2018-11-19 NOTE — Anesthesia Post-op Follow-up Note (Signed)
Anesthesia QCDR form completed.        

## 2018-11-19 NOTE — Op Note (Signed)
[facility] Patient Name: Ary Rudnick Procedure Date: 11/19/2018 10:49 AM MRN: [id number] Account #: [id number] Date of Birth: 11-18-1945 Admit Type: Outpatient Age: 73 Room: [facility] ENDO ROOM 2 Gender: Female Note Status: Finalized Procedure:            Colonoscopy Indications:          High risk colon cancer surveillance: Personal history                        of colonic polyps Providers:            Karly Pitter B. Bonna Gains MD, MD Referring MD:         Casilda Carls, MD (Referring MD) Medicines:            Monitored Anesthesia Care Complications:        No immediate complications. Procedure:            Pre-Anesthesia Assessment:                       - ASA Grade Assessment: II - A patient with mild                        systemic disease.                       - Prior to the procedure, a History and Physical was                        performed, and patient medications, allergies and                        sensitivities were reviewed. The patient's tolerance of                        previous anesthesia was reviewed.                       - The risks and benefits of the procedure and the                        sedation options and risks were discussed with the                        patient. All questions were answered and informed                        consent was obtained.                       - Patient identification and proposed procedure were                        verified prior to the procedure by the physician, the                        nurse, the anesthesiologist, the anesthetist and the                        technician. The procedure was verified in the procedure  room.                       After obtaining informed consent, the colonoscope was                        passed under direct vision. Throughout the procedure,                        the patient's blood pressure, pulse, and oxygen         saturations were monitored continuously. The                        Colonoscope was introduced through the anus and                        advanced to the the cecum, identified by appendiceal                        orifice and ileocecal valve. The colonoscopy was                        performed with ease. The patient tolerated the                        procedure well. The quality of the bowel preparation                        was good. Findings:      Hemorrhoids were found on perianal exam.      A 10 mm polyp was found in the cecum. The polyp was flat. Area was       successfully injected with saline for lesion assessment, and this       injection appeared to lift the lesion adequately. The polyp was removed       with a cold snare. Resection and retrieval were complete.      Three sessile polyps were found in the ascending colon and cecum. The       polyps were 2 to 3 mm in size. These polyps were removed with a cold       biopsy forceps. Resection and retrieval were complete.      The exam was otherwise without abnormality.      The rectum, sigmoid colon, descending colon, transverse colon, ascending       colon and cecum appeared normal.      Non-bleeding internal hemorrhoids were found during retroflexion. The       hemorrhoids were small. Impression:           - Hemorrhoids found on perianal exam.                       - One 10 mm polyp in the cecum, removed with a cold                        snare. Resected and retrieved. Injected.                       - Three 2 to 3 mm polyps in the ascending colon and in  the cecum, removed with a cold biopsy forceps. Resected                        and retrieved.                       - The examination was otherwise normal.                       - The rectum, sigmoid colon, descending colon,                        transverse colon, ascending colon and cecum are normal.                       - Non-bleeding  internal hemorrhoids. Recommendation:       - Discharge patient to home (with escort).                       - Advance diet as tolerated.                       - Continue present medications.                       - Await pathology results.                       - Repeat colonoscopy in 3 years.                       - The findings and recommendations were discussed with                        the patient.                       - The findings and recommendations were discussed with                        the patient's family.                       - Return to primary care physician as previously                        scheduled.                       - High fiber diet. Procedure Code(s):    --- Professional ---                       [phone], Colonoscopy, flexible; with removal of tumor(s),                        polyp(s), or other lesion(s) by snare technique                       45381, Colonoscopy, flexible; with directed submucosal                        injection(s), any substance  32440, 59, Colonoscopy, flexible; with biopsy, single                        or multiple Diagnosis Code(s):    --- Professional ---                       Z86.010, Personal history of colonic polyps                       K64.8, Other hemorrhoids                       D12.0, Benign neoplasm of cecum                       D12.2, Benign neoplasm of ascending colon CPT copyright 2018 American Medical Association. All rights reserved. The codes documented in this report are preliminary and upon coder review may  be revised to meet current compliance requirements.  Vonda Antigua, MD Margretta Sidle B. Bonna Gains MD, MD 11/19/2018 11:37:38 AM This report has been signed electronically. Number of Addenda: 0 Note Initiated On: 11/19/2018 10:49 AM Scope Withdrawal Time: 0 hours 16 minutes 30 seconds  Total Procedure Duration: 0 hours 22 minutes 39 seconds  Estimated Blood Loss: Estimated blood loss:  none.      [facility]

## 2018-11-19 NOTE — Anesthesia Postprocedure Evaluation (Signed)
Anesthesia Post Note  Patient: Courtney Roth  Procedure(s) Performed: COLONOSCOPY WITH PROPOFOL (N/A )  Patient location during evaluation: Endoscopy Anesthesia Type: General Level of consciousness: awake and alert and oriented Pain management: pain level controlled Vital Signs Assessment: post-procedure vital signs reviewed and stable Respiratory status: spontaneous breathing, nonlabored ventilation and respiratory function stable Cardiovascular status: blood pressure returned to baseline and stable Postop Assessment: no signs of nausea or vomiting Anesthetic complications: no     Last Vitals:  Vitals:   11/19/18 1150 11/19/18 1200  BP: 99/68 96/61  Pulse: 84 88  Resp: (!) 21 16  Temp:    SpO2: 99% 96%    Last Pain:  Vitals:   11/19/18 1200  TempSrc:   PainSc: 0-No pain                 Elissa Grieshop

## 2018-11-22 ENCOUNTER — Encounter: Payer: Self-pay | Admitting: Gastroenterology

## 2018-11-23 ENCOUNTER — Encounter: Payer: Self-pay | Admitting: Gastroenterology

## 2018-11-23 LAB — SURGICAL PATHOLOGY

## 2018-11-26 ENCOUNTER — Telehealth: Payer: Self-pay | Admitting: Gastroenterology

## 2018-11-26 NOTE — Telephone Encounter (Signed)
Called pt to set up apt patient was unaware of this and did not want to set up an apt to see Dr. Marius Ditch regarding her history of ulcerative colitis

## 2018-11-26 NOTE — Telephone Encounter (Signed)
-----   Message from Virgel Manifold, MD sent at 11/19/2018 12:32 PM EST -----  Please set up clinic new patient appointment with Dr. Marius Ditch for history of ulcerative colitis.

## 2018-11-29 ENCOUNTER — Telehealth: Payer: Self-pay | Admitting: Gastroenterology

## 2018-11-29 NOTE — Telephone Encounter (Signed)
Called pt to schedule an apt per Dr. Darene Lamer for Ulcerative colitis she still does not  Feel she needs this apt she would like a reason why  Dr. Darene Lamer feels she needs to be seen she just had a colonoscopy and was told she was good for 3 years. Please call pt to inform her why she needs this apt

## 2018-11-30 ENCOUNTER — Telehealth: Payer: Self-pay | Admitting: Gastroenterology

## 2018-11-30 NOTE — Telephone Encounter (Signed)
Patient called their office stating we keep calling her to make an appointment & not telling her why she needed to come in. She had her Colonoscopy & was good for another 3 yrs. Tati explained to the patient on 11-29-2018 it was for  Ulcerative colitis. Crystal with Dr Guerry Bruin office ask [place] to call to keep them updated.      Virgel Manifold, MD  Gastroenterology   Conversation  (Newest Message First)        11/29/18 11:00 AM  Dellinger, Dara Lords routed this conversation to Virgel Manifold, MD  Dellinger, Dara Lords       11/29/18 11:00 AM  Note      Additional Documentation   Encounter Info:   Billing Info,   History,   Allergies,   Detailed Report     Orders Placed    None  Medication Renewals and Changes     None    Medication List   Visit Diagnoses     None    Problem List

## 2018-12-10 ENCOUNTER — Other Ambulatory Visit: Payer: Self-pay | Admitting: *Deleted

## 2018-12-10 MED ORDER — METOPROLOL TARTRATE 25 MG PO TABS: 25.0000 mg | ORAL_TABLET | Freq: Two times a day (BID) | ORAL | 0 refills | Status: DC

## 2019-03-06 ENCOUNTER — Other Ambulatory Visit: Payer: Self-pay | Admitting: Cardiovascular Disease

## 2019-03-08 ENCOUNTER — Other Ambulatory Visit: Payer: Self-pay

## 2019-03-08 ENCOUNTER — Encounter: Payer: Self-pay | Admitting: Cardiovascular Disease

## 2019-03-08 ENCOUNTER — Telehealth: Payer: Self-pay

## 2019-03-08 ENCOUNTER — Telehealth (INDEPENDENT_AMBULATORY_CARE_PROVIDER_SITE_OTHER): Payer: Medicare Other | Admitting: Cardiovascular Disease

## 2019-03-08 VITALS — HR 80 | Ht 60.0 in | Wt 234.0 lb

## 2019-03-08 DIAGNOSIS — I2510 Atherosclerotic heart disease of native coronary artery without angina pectoris: Secondary | ICD-10-CM | POA: Diagnosis not present

## 2019-03-08 DIAGNOSIS — I10 Essential (primary) hypertension: Secondary | ICD-10-CM

## 2019-03-08 DIAGNOSIS — E785 Hyperlipidemia, unspecified: Secondary | ICD-10-CM

## 2019-03-08 NOTE — Patient Instructions (Signed)
Medication Instructions:  No change in medications If you need a refill on your cardiac medications before your next appointment, please call your pharmacy.   Lab work: None If you have labs (blood work) drawn today and your tests are completely normal, you will receive your results only by: . MyChart Message (if you have MyChart) OR . A paper copy in the mail If you have any lab test that is abnormal or we need to change your treatment, we will call you to review the results.  Testing/Procedures: None  Follow-Up: At [facility], you and your health needs are our priority.  As part of our continuing mission to provide you with exceptional heart care, we have created designated Provider Care Teams.  These Care Teams include your primary Cardiologist (physician) and Advanced Practice Providers (APPs -  Physician Assistants and Nurse Practitioners) who all work together to provide you with the care you need, when you need it. You will need a follow up appointment in 6 months.  Please call our office 2 months in advance to schedule this appointment.  You may see Muhammad Arida, MD or one of the following Advanced Practice Providers on your designated Care Team:   Christopher Berge, NP Ryan Dunn, PA-C . Jacquelyn Visser, PA-C   

## 2019-03-08 NOTE — Progress Notes (Signed)
Virtual Visit via Video Note   This visit type was conducted due to national recommendations for restrictions regarding the COVID-19 Pandemic (e.g. social distancing) in an effort to limit this patient's exposure and mitigate transmission in our community.  Due to her co-morbid illnesses, this patient is at least at moderate risk for complications without adequate follow up.  This format is felt to be most appropriate for this patient at this time.  All issues noted in this document were discussed and addressed.  A limited physical exam was performed with this format.  Please refer to the patient's chart for her consent to telehealth for [facility].   Date:  03/08/2019   ID:  Courtney Roth, DOB 1946/01/13, MRN [id number]  Patient Location: Home Provider Location: Office  PCP:  Casilda Carls, MD  Cardiologist:  Kathlyn Sacramento, MD  Electrophysiologist:  None   Evaluation Performed:  Follow-Up Visit  Chief Complaint: Arthritis pain  History of Present Illness:    Courtney Roth is a 73 y.o. female who was seen via video today for a follow-up visit regarding abnormal nuclear stress test and possible underlying ischemic heart disease.  She has chronic medical conditions that include hypertension, chronic back pain, morbid obesity, hyperlipidemia, ulcerative colitis and GERD. She is a previous smoker and quit about 20 years ago. She had an injury to her back in 2006 and she reports that she gained 100 pounds since then.  She had shortness of breath and atypical chest pain in 2019. She underwent a treadmill nuclear stress test with Dr.Jadali.  She was able to exercise for only 2 minutes and 13 seconds.  She achieved 85% maximal predicted heart rate.  There was moderate apical and anteroseptal defect which was reversible and suggestive of ischemia.  I discussed with her getting a definitive answer with cardiac catheterization at that time but she declined. She was noted to have  resting tachycardia and I added metoprolol 25 mg twice daily.  She had an echocardiogram done which showed an EF of 50 to 55% with grade 1 diastolic dysfunction and mild mitral regurgitation.  Pulmonary pressure could not be estimated. She had improvement in her symptoms with metoprolol and has been stable since her last evaluation.  No chest pain.  She has minimal palpitations.  The patient does not have symptoms concerning for COVID-19 infection (fever, chills, cough, or new shortness of breath).    Past Medical History:  Diagnosis Date  . Colon polyps 03/04/2007   History of Hyperplastic  . Dyspnea   . GERD (gastroesophageal reflux disease)   . Hyperlipidemia   . Hypertension   . Obesity   . Pneumonia   . Stress reaction   . Ulcerative colitis    Past Surgical History:  Procedure Laterality Date  . COLONOSCOPY WITH PROPOFOL N/A 11/19/2018   Procedure: COLONOSCOPY WITH PROPOFOL;  Surgeon: Virgel Manifold, MD;  Location: [facility] ENDOSCOPY;  Service: Endoscopy;  Laterality: N/A;  . ruptured tubal pregnancy  1975   1 ovary removed     Current Meds  Medication Sig  . acetaminophen (TYLENOL) 500 MG tablet Take 1,000 mg by mouth every 6 (six) hours as needed for mild pain (patient takes 1 - 2 depending on activity level).  Marland Kitchen aspirin 81 MG tablet Take 81 mg by mouth daily.    . Cholecalciferol (VITAMIN D3) 1000 UNITS CAPS Take 1 capsule by mouth daily.   . hydrochlorothiazide (HYDRODIURIL) 25 MG tablet Take 25 mg by mouth daily.  Marland Kitchen  levothyroxine (SYNTHROID) 75 MCG tablet Take 1 tablet by mouth daily.  Marland Kitchen lisinopril (ZESTRIL) 10 MG tablet Take 10 mg by mouth daily.  . metoprolol tartrate (LOPRESSOR) 25 MG tablet TAKE 1 TABLET BY MOUTH TWICE A DAY  . rosuvastatin (CRESTOR) 10 MG tablet Take 10 mg by mouth daily.  Marland Kitchen sulfaSALAzine (AZULFIDINE) 500 MG tablet Take 2 tablets (1,000 mg total) by mouth 2 (two) times daily.  . Vitamin D, Ergocalciferol, (DRISDOL) 1.25 MG (50000 UT) CAPS  capsule TAKE 1 CAPSULE ONCE A WEEK ORALLY     Allergies:   Codeine; Metoprolol; Metoprolol succinate; and Other   Social History   Tobacco Use  . Smoking status: Former Smoker    Types: Cigarettes    Last attempt to quit: 10/27/2006    Years since quitting: 12.3  . Smokeless tobacco: Never Used  Substance Use Topics  . Alcohol use: Yes    Alcohol/week: 0.0 standard drinks    Comment: rare  . Drug use: No     Family Hx: The patient's family history includes Breast cancer in her cousin and maternal aunt; Clotting disorder in her mother; Crohn's disease in her sister; Diabetes in her paternal uncle; GER disease in her mother; Heart disease in her father and mother; Hypertension in her mother; Osteoporosis in her mother. There is no history of Colon cancer.  ROS:   Please see the history of present illness.     All other systems reviewed and are negative.   Prior CV studies:   The following studies were reviewed today:    Labs/Other Tests and Data Reviewed:    EKG:  No ECG reviewed.  Recent Labs: No results found for requested labs within last 8760 hours.   Recent Lipid Panel Lab Results  Component Value Date/Time   CHOL 195 03/19/2015 12:37 PM   TRIG 137.0 03/19/2015 12:37 PM   HDL 46.90 03/19/2015 12:37 PM   CHOLHDL 4 03/19/2015 12:37 PM   LDLCALC 121 (H) 03/19/2015 12:37 PM   LDLDIRECT 155.2 11/30/2013 12:24 PM    Wt Readings from Last 3 Encounters:  03/08/19 234 lb (106.1 kg)  11/19/18 244 lb (110.7 kg)  08/31/18 249 lb 4 oz (113.1 kg)     Objective:    Vital Signs:  Pulse 80   Ht 5' (1.524 m)   Wt 234 lb (106.1 kg)   BMI 45.70 kg/m    VITAL SIGNS:  reviewed GEN:  no acute distress EYES:  sclerae anicteric, EOMI - Extraocular Movements Intact RESPIRATORY:  normal respiratory effort, symmetric expansion CARDIOVASCULAR:  no peripheral edema SKIN:  no rash, lesions or ulcers. MUSCULOSKELETAL:  no obvious deformities. NEURO:  alert and oriented x 3,  no obvious focal deficit PSYCH:  normal affect  ASSESSMENT & PLAN:    1.  Possible chronic ischemic heart disease with abnormal nuclear stress test: Stable symptoms overall.  Continuing medical therapy with aspirin, metoprolol and a statin.  2.  Essential hypertension: It appears that she resumed hydrochlorothiazide.  She is also on lisinopril and metoprolol.  3.  Hyperlipidemia: She is now on rosuvastatin and not simvastatin.  This is managed by her primary care physician.  4.  Morbid obesity: She was planning to start the keto diet but has not been able to follow this carefully given limited diet options.  COVID-19 Education: The signs and symptoms of COVID-19 were discussed with the patient and how to seek care for testing (follow up with PCP or arrange E-visit).  The importance  of social distancing was discussed today.  Time:   Today, I have spent 18 minutes with the patient with telehealth technology discussing the above problems.     Medication Adjustments/Labs and Tests Ordered: Current medicines are reviewed at length with the patient today.  Concerns regarding medicines are outlined above.   Tests Ordered: No orders of the defined types were placed in this encounter.   Medication Changes: No orders of the defined types were placed in this encounter.   Disposition:  Follow up in 6 month(s)  Signed, Kathlyn Sacramento, MD  03/08/2019 1:20 PM    [facility]

## 2019-03-08 NOTE — Telephone Encounter (Signed)

## 2019-05-31 ENCOUNTER — Other Ambulatory Visit: Payer: Self-pay | Admitting: Cardiovascular Disease

## 2019-09-06 ENCOUNTER — Other Ambulatory Visit: Payer: Self-pay | Admitting: Nephrology

## 2019-09-06 DIAGNOSIS — N1832 Chronic kidney disease, stage 3b: Secondary | ICD-10-CM

## 2019-09-13 ENCOUNTER — Ambulatory Visit
Admission: RE | Admit: 2019-09-13 | Discharge: 2019-09-13 | Disposition: A | Discharge status: Home / Self Care | Payer: Medicare Other | Source: Ambulatory Visit | Attending: Nephrology | Admitting: Nephrology

## 2019-09-13 ENCOUNTER — Other Ambulatory Visit: Payer: Self-pay

## 2019-09-13 DIAGNOSIS — N1832 Chronic kidney disease, stage 3b: Secondary | ICD-10-CM | POA: Diagnosis not present

## 2019-10-18 ENCOUNTER — Encounter: Payer: Self-pay | Admitting: Family

## 2019-10-18 ENCOUNTER — Ambulatory Visit (INDEPENDENT_AMBULATORY_CARE_PROVIDER_SITE_OTHER): Payer: Medicare Other | Admitting: Family

## 2019-10-18 ENCOUNTER — Other Ambulatory Visit: Payer: Self-pay

## 2019-10-18 ENCOUNTER — Other Ambulatory Visit
Admission: RE | Admit: 2019-10-18 | Discharge: 2019-10-18 | Disposition: A | Discharge status: Home / Self Care | Payer: Medicare Other | Source: Ambulatory Visit | Attending: Family | Admitting: Family

## 2019-10-18 VITALS — BP 102/72 | HR 63 | Ht 60.0 in | Wt 238.8 lb

## 2019-10-18 DIAGNOSIS — I2510 Atherosclerotic heart disease of native coronary artery without angina pectoris: Secondary | ICD-10-CM

## 2019-10-18 DIAGNOSIS — E785 Hyperlipidemia, unspecified: Secondary | ICD-10-CM

## 2019-10-18 DIAGNOSIS — I10 Essential (primary) hypertension: Secondary | ICD-10-CM

## 2019-10-18 LAB — COMPREHENSIVE METABOLIC PANEL
ALT: 20 U/L (ref 0–44)
AST: 24 U/L (ref 15–41)
Albumin: 4.2 g/dL (ref 3.5–5.0)
Alkaline Phosphatase: 64 U/L (ref 38–126)
Anion gap: 7 (ref 5–15)
BUN: 36 mg/dL — ABNORMAL HIGH (ref 8–23)
CO2: 28 mmol/L (ref 22–32)
Calcium: 9.3 mg/dL (ref 8.9–10.3)
Chloride: 107 mmol/L (ref 98–111)
Creatinine, Ser: 1.12 mg/dL — ABNORMAL HIGH (ref 0.44–1.00)
GFR calc Af Amer: 56 mL/min — ABNORMAL LOW (ref >60)
GFR calc non Af Amer: 48 mL/min — ABNORMAL LOW (ref >60)
Glucose, Bld: 108 mg/dL — ABNORMAL HIGH (ref 70–99)
Potassium: 5 mmol/L (ref 3.5–5.1)
Sodium: 142 mmol/L (ref 135–145)
Total Bilirubin: 0.8 mg/dL (ref 0.3–1.2)
Total Protein: 6.8 g/dL (ref 6.5–8.1)

## 2019-10-18 LAB — LIPID PANEL
Cholesterol: 197 mg/dL (ref 0–200)
HDL: 45 mg/dL (ref >40)
LDL Cholesterol: 114 mg/dL — ABNORMAL HIGH (ref 0–99)
Total CHOL/HDL Ratio: 4.4 RATIO
Triglycerides: 188 mg/dL — ABNORMAL HIGH (ref <150)
VLDL: 38 mg/dL (ref 0–40)

## 2019-10-18 NOTE — Addendum Note (Signed)
Addended by: Mady Haagensen on: 10/18/2019 02:24 PM   Modules accepted: Orders

## 2019-10-18 NOTE — Progress Notes (Signed)
Office Visit    Patient Name: Courtney Roth Date of Encounter: 10/18/2019  Primary Care Provider:  Casilda Carls, MD Primary Cardiologist:  Kathlyn Sacramento, MD Electrophysiologist:  None   Chief Complaint    Courtney Roth is a 73 y.o. female with a hx of HTN, chronic back pain, morbid obesity, HLD, ulcerative colitis, GERD, remote tobacco abuse, chest pain presents today for 45-monthfollow-up chest pain, HTN.  Past Medical History    Past Medical History:  Diagnosis Date  . Colon polyps 03/04/2007   History of Hyperplastic  . Dyspnea   . GERD (gastroesophageal reflux disease)   . Hyperlipidemia   . Hypertension   . Obesity   . Pneumonia   . Stress reaction   . Ulcerative colitis    Past Surgical History:  Procedure Laterality Date  . COLONOSCOPY WITH PROPOFOL N/A 11/19/2018   Procedure: COLONOSCOPY WITH PROPOFOL;  Surgeon: TVirgel Manifold MD;  Location: [facility] ENDOSCOPY;  Service: Endoscopy;  Laterality: N/A;  . ruptured tubal pregnancy  1975   1 ovary removed    Allergies  Allergies  Allergen Reactions  . Codeine Other (See Comments)    Makes pt feel Crazy; Anxious Makes pt feel Crazy; Anxious  Makes pt feel Crazy; Anxious Makes pt feel Crazy; Anxious Makes pt feel Crazy; Anxious Makes pt feel Crazy; Anxious  Makes pt feel Crazy; Anxious Makes pt feel Crazy; Anxious Makes pt feel Crazy; Anxious Makes pt feel Crazy; Anxious Makes pt feel Crazy; Anxious  Makes pt feel Crazy; Anxious Makes pt feel Crazy; Anxious Makes pt feel Crazy; Anxious Makes pt feel Crazy; Anxious Other reaction(s): Other (See Comments) Makes pt feel Crazy; Anxious Makes pt feel Crazy; Anxious Makes pt feel Crazy; Anxious Makes pt feel Crazy; Anxious Makes pt feel Crazy; Anxious  Makes pt feel Crazy; Anxious Makes pt feel Crazy; Anxious Makes pt feel Crazy; Anxious Makes pt feel Crazy; Anxious Makes pt feel Crazy; Anxious Makes pt feel Crazy;  Anxious Makes pt feel Crazy; Anxious  Makes pt feel Crazy; Anxious Makes pt feel Crazy; Anxious Makes pt feel Crazy; Anxious Makes pt feel Crazy; Anxious   . Metoprolol Other (See Comments)  . Metoprolol Succinate     REACTION: sob, fatigue  . Other Other (See Comments)    History of Present Illness    Courtney SCROGGINis a 73y.o. 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 AFletcher Anon  Previous smoker and quit about 20 years ago.  Injury to her back 2006 reports she has gained 100 pounds since then.  She was evaluated for shortness of breath and atypical chest pain 2019 with treadmill nuclear stress test with Dr. JRosario Jacksapprox 05/2018. She exercised for 2 minutes 13 seconds, 85% maximal predicted heart rate, moderate apical and anteroseptal defect which was reversible and suggestive of ischemia.  She has previously declined cardiac catheterization.  She was started on metoprolol for resting tachycardia.  Echo with EF 50 to 591% grade 1 diastolic dysfunction, mild MR.  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  She reports feeling overall.  She reports no chest pain, pressure, tightness.  She reports no shortness of breath at rest.  She does report some dyspnea with exertion which she attributes to being overweight.  She has chronic back pain and bilateral knees with significant arthritis.  She previously had  some relief from injection to her knees but reported this has not been as effective recently.  Tells me she cannot be up and about for more than 10 to 15 minutes before having to lay down.  Endorses eating a low-sodium diet.  Does endorse that she probably eats more fried food than she should.  We reviewed heart healthy diet.  Does not check blood pressure at home.  Her blood pressure is low normal today.  She reports no lightheadedness,  dizziness, near-syncope, syncope.  EKGs/Labs/Other Studies Reviewed:   The following studies were reviewed today:  Echo 06/25/18 Left ventricle: The cavity size was normal. Systolic function was   normal. The estimated ejection fraction was in the range of 50%   to 55%. Regional wall motion abnormalities cannot be excluded.   Doppler parameters are consistent with abnormal left ventricular   relaxation (grade 1 diastolic dysfunction). - Mitral valve: There was mild regurgitation. - Left atrium: The atrium was normal in size. - Right ventricle: Systolic function was normal. - Pulmonary arteries: Systolic pressure was within the normal   range.  EKG:  EKG is ordered today.  The ekg ordered today demonstrates sinus rhythm with poor R wave progression rate 61 bpm.  Recent Labs: No results found for requested labs within last 8760 hours.  Recent Lipid Panel    Component Value Date/Time   CHOL 195 03/19/2015 1237   TRIG 137.0 03/19/2015 1237   HDL 46.90 03/19/2015 1237   CHOLHDL 4 03/19/2015 1237   VLDL 27.4 03/19/2015 1237   LDLCALC 121 (H) 03/19/2015 1237   LDLDIRECT 155.2 11/30/2013 1224    Home Medications   Current Meds  Medication Sig  . acetaminophen (TYLENOL) 500 MG tablet Take 1,000 mg by mouth every 6 (six) hours as needed for mild pain (patient takes 1 - 2 depending on activity level).  Marland Kitchen aspirin 81 MG tablet Take 81 mg by mouth daily.    . Cholecalciferol (VITAMIN D3) 1000 UNITS CAPS Take 1 capsule by mouth daily.   . hydrochlorothiazide (HYDRODIURIL) 25 MG tablet Take 25 mg by mouth daily.  Marland Kitchen levothyroxine (SYNTHROID) 75 MCG tablet Take 1 tablet by mouth daily.  Marland Kitchen lisinopril (ZESTRIL) 10 MG tablet Take 10 mg by mouth daily.  . metoprolol tartrate (LOPRESSOR) 25 MG tablet TAKE 1 TABLET BY MOUTH TWICE A DAY  . rosuvastatin (CRESTOR) 10 MG tablet Take 10 mg by mouth daily.  Marland Kitchen sulfaSALAzine (AZULFIDINE) 500 MG tablet Take 2 tablets (1,000 mg total) by mouth 2 (two)  times daily.  . traMADol (ULTRAM) 50 MG tablet Take 1-2 tablets by mouth 3 (three) times daily. Patient states she only takes 3 per day  . Vitamin D, Ergocalciferol, (DRISDOL) 1.25 MG (50000 UT) CAPS capsule TAKE 1 CAPSULE ONCE A WEEK ORALLY      Review of Systems     Review of Systems  Constitution: Negative for chills, fever and malaise/fatigue.  Cardiovascular: Negative for chest pain, dyspnea on exertion, leg swelling, near-syncope, orthopnea, palpitations and syncope.  Respiratory: Negative for cough, shortness of breath and wheezing.   Musculoskeletal: Positive for arthritis, back pain and joint pain.  Gastrointestinal: Negative for nausea and vomiting.  Neurological: Negative for dizziness, light-headedness and weakness.   All other systems reviewed and are otherwise negative except as noted above.  Physical Exam    VS:  BP 102/72   Pulse 63   Ht 5' (1.524 m)   Wt 238 lb 12 oz (108.3 kg)   BMI  46.63 kg/m  , BMI Body mass index is 46.63 kg/m. GEN: Well nourished, obese, well developed, in no acute distress. HEENT: normal. Neck: Supple, no JVD, carotid bruits, or masses. Cardiac: RRR, no murmurs, rubs, or gallops. No clubbing, cyanosis, edema.  Radials/DP/PT 2+ and equal bilaterally.  Respiratory:  Respirations regular and unlabored, clear to auscultation bilaterally. GI: Soft, nontender, nondistended, BS + x 4. MS: No deformity or atrophy. Skin: Warm and dry, no rash. Neuro:  Strength and sensation are intact. Psych: Normal affect.  Accessory Clinical Findings    ECG personally reviewed by me today -sinus rhythm 61 bpm with low voltage QRS and poor R wave progression- no acute changes.  Assessment & Plan    1. Abnormal nuclear stress test 2019 - She has previously declined cardiac catheterization.  She has noted family history of CAD.  She reports no recurrent chest pain, pressure, tightness.  No indication for ischemia evaluation this time.  Continue GDMT aspirin,  beta-blocker, statin.  2. HTN -BP well controlled today.  Continue present antihypertensive regimen as managed by her PCP.  3. HLD - Unable to find lipid profile since 2018.  Lipid profile, direct LDL, CMET today. Continue Rosuvastatin.   4. DOE - Reports this is at her baseline.  She attributes it to being overweight.  Likely etiology deconditioning. No signs of heart failure on ROS or exam, echo 05/2018 with normal LVEF, gr1DD.   5. Obesity - Weight loss encouraged. Unable to exercise secondary to chronic back pain, arthritis in her knees.  Recommended as much physical activity she can tolerate.  Recommend low-sodium, heart healthy diet  6. Grade 1 diastolic dysfunction -noted on echo 05/2018.  She is euvolemic on exam.  Reports no lower extremity edema.  Reports her DOE is at baseline.  7. EXN1Z - Seen 08/2019 by nephrology. Careful titration of any antihypertensive or diuretic medications.   Disposition: Follow up in 6 month(s) with Dr. Fletcher Anon or APP  Loel Dubonnet, NP 10/18/2019, 2:16 PM

## 2019-10-18 NOTE — Patient Instructions (Signed)
Medication Instructions:  No medication changes today.  *If you need a refill on your cardiac medications before your next appointment, please call your pharmacy*  Lab Work: Your physician recommends that you return for lab work today: CMET, direct LDL, lipid profile  If you have labs (blood work) drawn today and your tests are completely normal, you will receive your results only by: Marland Kitchen MyChart Message (if you have MyChart) OR . A paper copy in the mail If you have any lab test that is abnormal or we need to change your treatment, we will call you to review the results.  Testing/Procedures: You had an EKG today.  Follow-Up: At [facility], you and your health needs are our priority.  As part of our continuing mission to provide you with exceptional heart care, we have created designated Provider Care Teams.  These Care Teams include your primary Cardiologist (physician) and Advanced Practice Providers (APPs -  Physician Assistants and Nurse Practitioners) who all work together to provide you with the care you need, when you need it.  Your next appointment:   6 month(s)  The format for your next appointment:   In Person  Provider:    You may see Kathlyn Sacramento, MD or one of the following Advanced Practice Providers on your designated Care Team:    Murray Hodgkins, NP  Christell Faith, PA-C  Marrianne Mood, PA-C  Other Instructions  DASH Eating Plan DASH stands for "Dietary Approaches to Stop Hypertension." The DASH eating plan is a healthy eating plan that has been shown to reduce high blood pressure (hypertension). It may also reduce your risk for type 2 diabetes, heart disease, and stroke. The DASH eating plan may also help with weight loss. What are tips for following this plan?  General guidelines  Avoid eating more than 2,300 mg (milligrams) of salt (sodium) a day. If you have hypertension, you may need to reduce your sodium intake to 1,500 mg a day.  Limit alcohol  intake to no more than 1 drink a day for nonpregnant women and 2 drinks a day for men. One drink equals 12 oz of beer, 5 oz of wine, or 1 oz of hard liquor.  Work with your health care provider to maintain a healthy body weight or to lose weight. Ask what an ideal weight is for you.  Get at least 30 minutes of exercise that causes your heart to beat faster (aerobic exercise) most days of the week. Activities may include walking, swimming, or biking.  Work with your health care provider or diet and nutrition specialist (dietitian) to adjust your eating plan to your individual calorie needs. Reading food labels   Check food labels for the amount of sodium per serving. Choose foods with less than 5 percent of the Daily Value of sodium. Generally, foods with less than 300 mg of sodium per serving fit into this eating plan.  To find whole grains, look for the word "whole" as the first word in the ingredient list. Shopping  Buy products labeled as "low-sodium" or "no salt added."  Buy fresh foods. Avoid canned foods and premade or frozen meals. Cooking  Avoid adding salt when cooking. Use salt-free seasonings or herbs instead of table salt or sea salt. Check with your health care provider or pharmacist before using salt substitutes.  Do not fry foods. Cook foods using healthy methods such as baking, boiling, grilling, and broiling instead.  Cook with heart-healthy oils, such as olive, canola, soybean, or sunflower  oil. Meal planning  Eat a balanced diet that includes: ? 5 or more servings of fruits and vegetables each day. At each meal, try to fill half of your plate with fruits and vegetables. ? Up to 6-8 servings of whole grains each day. ? Less than 6 oz of lean meat, poultry, or fish each day. A 3-oz serving of meat is about the same size as a deck of cards. One egg equals 1 oz. ? 2 servings of low-fat dairy each day. ? A serving of nuts, seeds, or beans 5 times each  week. ? Heart-healthy fats. Healthy fats called Omega-3 fatty acids are found in foods such as flaxseeds and coldwater fish, like sardines, salmon, and mackerel.  Limit how much you eat of the following: ? Canned or prepackaged foods. ? Food that is high in trans fat, such as fried foods. ? Food that is high in saturated fat, such as fatty meat. ? Sweets, desserts, sugary drinks, and other foods with added sugar. ? Full-fat dairy products.  Do not salt foods before eating.  Try to eat at least 2 vegetarian meals each week.  Eat more home-cooked food and less restaurant, buffet, and fast food.  When eating at a restaurant, ask that your food be prepared with less salt or no salt, if possible. What foods are recommended? The items listed may not be a complete list. Talk with your dietitian about what dietary choices are best for you. Grains Whole-grain or whole-wheat bread. Whole-grain or whole-wheat pasta. Brown rice. [place]. Bulgur. Whole-grain and low-sodium cereals. Pita bread. Low-fat, low-sodium crackers. Whole-wheat flour tortillas. Vegetables Fresh or frozen vegetables (raw, steamed, roasted, or grilled). Low-sodium or reduced-sodium tomato and vegetable juice. Low-sodium or reduced-sodium tomato sauce and tomato paste. Low-sodium or reduced-sodium canned vegetables. Fruits All fresh, dried, or frozen fruit. Canned fruit in natural juice (without added sugar). Meat and other protein foods Skinless chicken or [place]. Ground chicken or [place]. Pork with fat trimmed off. Fish and seafood. Egg whites. Dried beans, peas, or lentils. Unsalted nuts, nut butters, and seeds. Unsalted canned beans. Lean cuts of beef with fat trimmed off. Low-sodium, lean deli meat. Dairy Low-fat (1%) or fat-free (skim) milk. Fat-free, low-fat, or reduced-fat cheeses. Nonfat, low-sodium ricotta or cottage cheese. Low-fat or nonfat yogurt. Low-fat, low-sodium cheese. Fats and oils Soft margarine  without trans fats. Vegetable oil. Low-fat, reduced-fat, or light mayonnaise and salad dressings (reduced-sodium). Canola, safflower, olive, soybean, and sunflower oils. Avocado. Seasoning and other foods Herbs. Spices. Seasoning mixes without salt. Unsalted popcorn and pretzels. Fat-free sweets. What foods are not recommended? The items listed may not be a complete list. Talk with your dietitian about what dietary choices are best for you. Grains Baked goods made with fat, such as croissants, muffins, or some breads. Dry pasta or rice meal packs. Vegetables Creamed or fried vegetables. Vegetables in a cheese sauce. Regular canned vegetables (not low-sodium or reduced-sodium). Regular canned tomato sauce and paste (not low-sodium or reduced-sodium). Regular tomato and vegetable juice (not low-sodium or reduced-sodium). Angie Fava. Olives. Fruits Canned fruit in a light or heavy syrup. Fried fruit. Fruit in cream or butter sauce. Meat and other protein foods Fatty cuts of meat. Ribs. Fried meat. Berniece Salines. Sausage. Bologna and other processed lunch meats. Salami. Fatback. Hotdogs. Bratwurst. Salted nuts and seeds. Canned beans with added salt. Canned or smoked fish. Whole eggs or egg yolks. Chicken or [place] with skin. Dairy Whole or 2% milk, cream, and half-and-half. Whole or full-fat cream cheese.  Whole-fat or sweetened yogurt. Full-fat cheese. Nondairy creamers. Whipped toppings. Processed cheese and cheese spreads. Fats and oils Butter. Stick margarine. Lard. Shortening. Ghee. Bacon fat. Tropical oils, such as coconut, palm kernel, or palm oil. Seasoning and other foods Salted popcorn and pretzels. Onion salt, garlic salt, seasoned salt, table salt, and sea salt. Worcestershire sauce. Tartar sauce. Barbecue sauce. Teriyaki sauce. Soy sauce, including reduced-sodium. Steak sauce. Canned and packaged gravies. Fish sauce. Oyster sauce. Cocktail sauce. Horseradish that you find on the shelf. Ketchup.  Mustard. Meat flavorings and tenderizers. Bouillon cubes. Hot sauce and Tabasco sauce. Premade or packaged marinades. Premade or packaged taco seasonings. Relishes. Regular salad dressings. Where to find more information:  National Heart, Lung, and [place]: https://wilson-eaton.com/  American Heart Association: www.heart.org Summary  The DASH eating plan is a healthy eating plan that has been shown to reduce high blood pressure (hypertension). It may also reduce your risk for type 2 diabetes, heart disease, and stroke.  With the DASH eating plan, you should limit salt (sodium) intake to 2,300 mg a day. If you have hypertension, you may need to reduce your sodium intake to 1,500 mg a day.  When on the DASH eating plan, aim to eat more fresh fruits and vegetables, whole grains, lean proteins, low-fat dairy, and heart-healthy fats.  Work with your health care provider or diet and nutrition specialist (dietitian) to adjust your eating plan to your individual calorie needs. This information is not intended to replace advice given to you by your health care provider. Make sure you discuss any questions you have with your health care provider. Document Released: 10/02/2011 Document Revised: 09/25/2017 Document Reviewed: 10/06/2016 Elsevier Patient Education  2020 [employer].

## 2019-10-19 ENCOUNTER — Telehealth: Payer: Self-pay | Admitting: Family

## 2019-10-19 DIAGNOSIS — E785 Hyperlipidemia, unspecified: Secondary | ICD-10-CM

## 2019-10-19 DIAGNOSIS — Z79899 Other long term (current) drug therapy: Secondary | ICD-10-CM

## 2019-10-19 MED ORDER — ROSUVASTATIN CALCIUM 20 MG PO TABS: 20.0000 mg | ORAL_TABLET | Freq: Every day | ORAL | 3 refills | Status: DC

## 2019-10-19 NOTE — Telephone Encounter (Signed)
I spoke with the patient regarding her lab results.  She is aware of Laurann Montana, NP's recommendations to: 1) Increase Crestor to 20 mg once daily 2) repeat a FASTING lipid/ liver panel in ~ 8 weeks at the [place] [facility]  The patient is agreeable with the above recommendations and voices understanding.

## 2019-10-19 NOTE — Telephone Encounter (Signed)
Loel Dubonnet, NP  10/19/2019 10:18 AM EST    Kidney function stable. Normal liver function. Cholesterol panel shows good HDL (at goal of more than 40), but elevated total cholesterol and LDL. Her LDL is 114 and we would like for it to be less than 129m. Please increase Crestor from 160mto 2087maily (provide Rx if needed) and recheck lipid/liver in 8 weeks.

## 2019-10-20 LAB — LDL CHOLESTEROL, DIRECT: Direct LDL: 120.4 mg/dL — ABNORMAL HIGH (ref 0–99)

## 2019-12-04 IMAGING — US US RENAL
1 series · 14 of 25 positions shown · non-contrast
Comparison: None.

CLINICAL DATA: Chronic kidney disease stage 3 B.

EXAM:
RENAL / URINARY TRACT ULTRASOUND COMPLETE

[Series 1: us renal · 0.28mm/px · 14 of 39 slices shown]
[im 1/39]
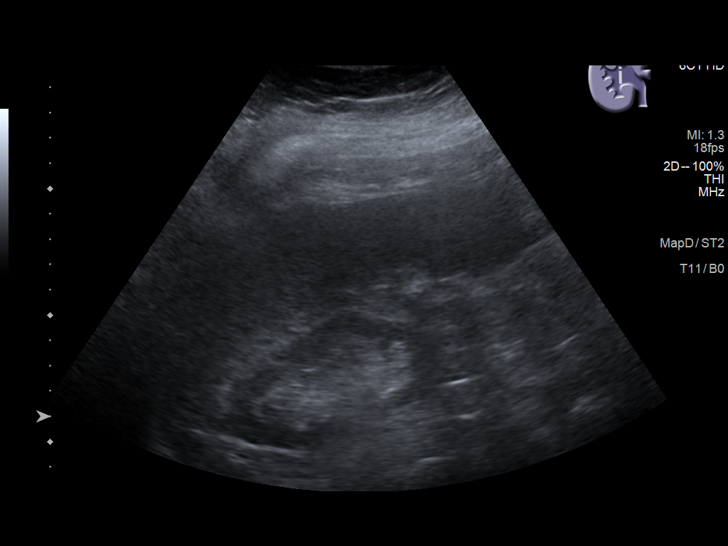
[im 4/39]
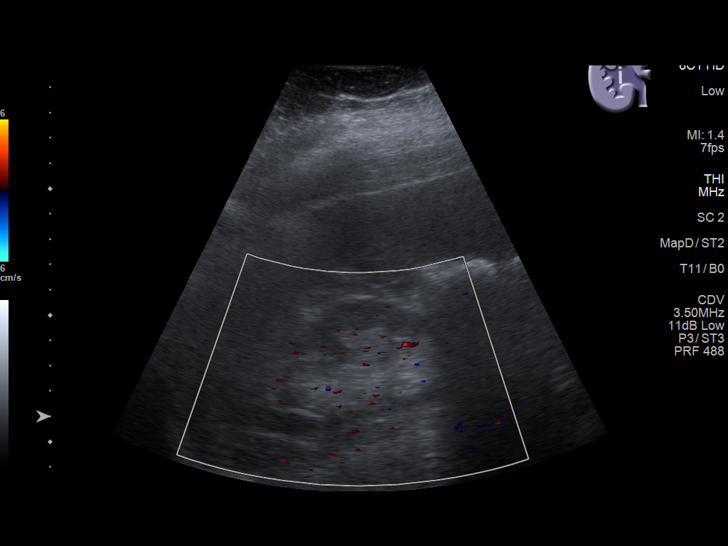
[im 7/39]
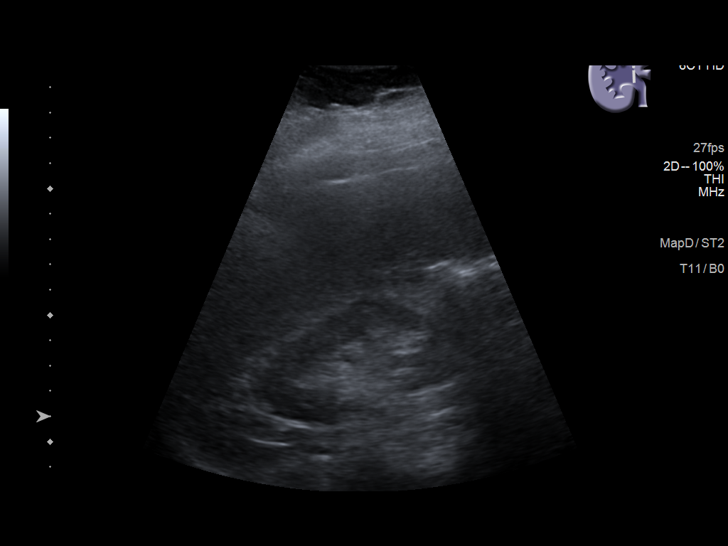
[im 10/39]
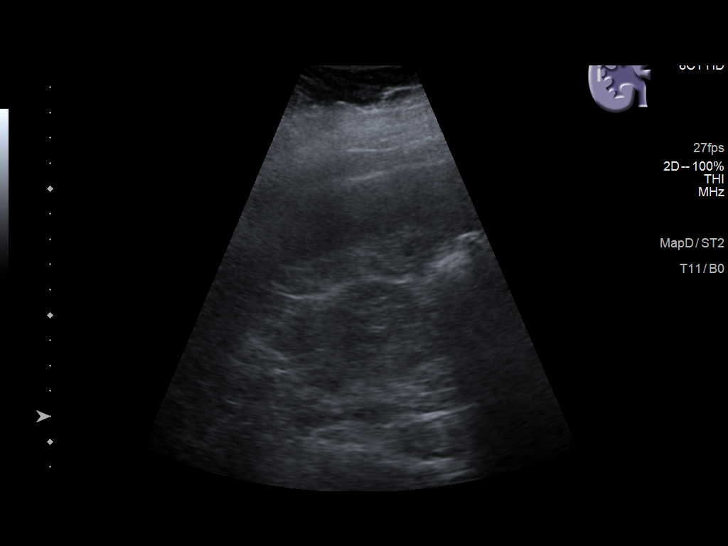
[im 13/39]
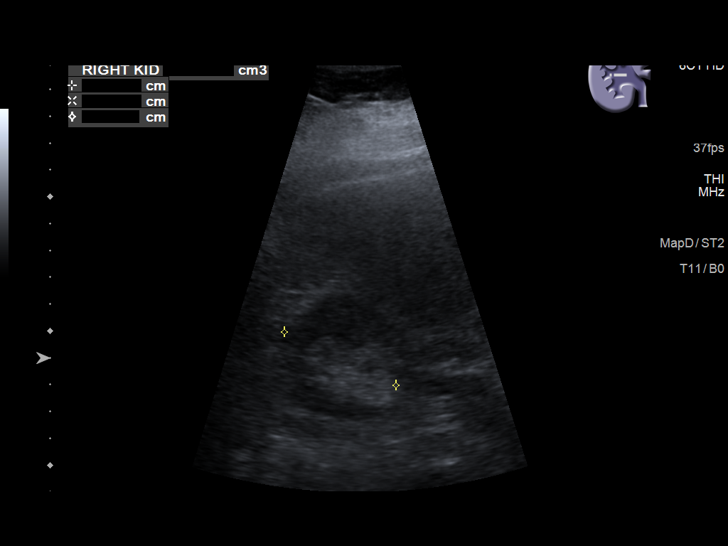
[im 15/39]
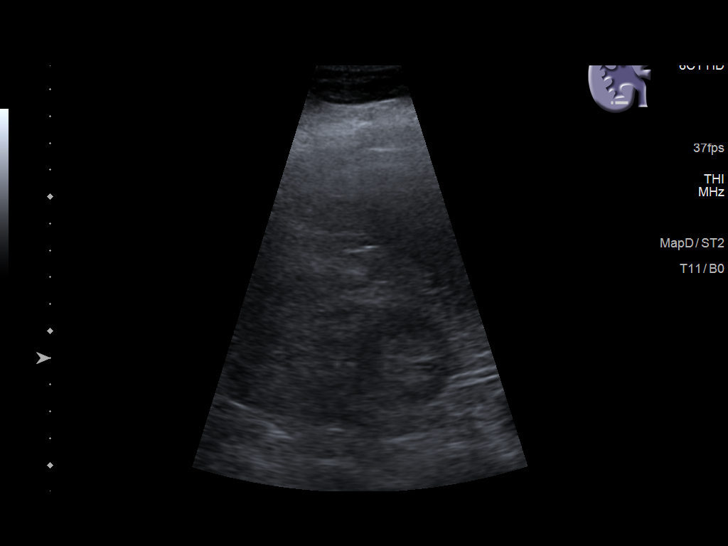
[im 18/39]
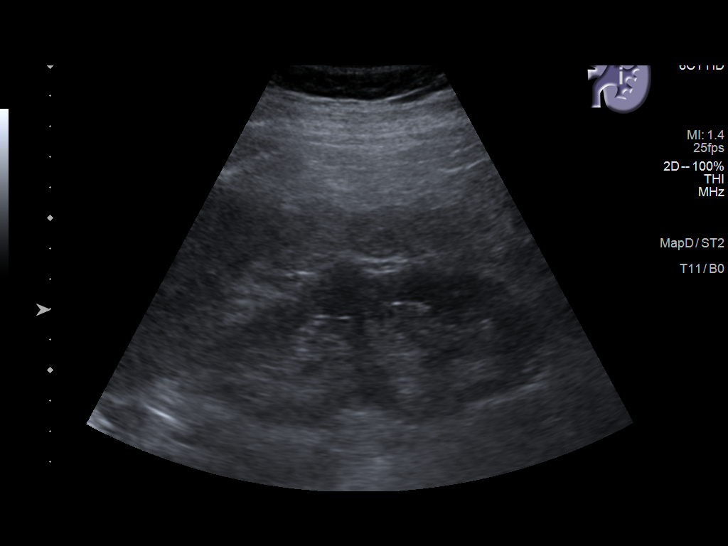
[im 21/39]
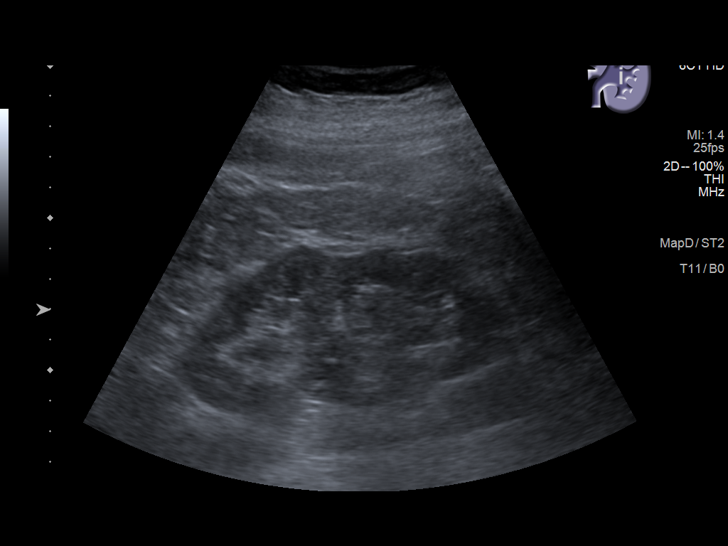
[im 24/39]
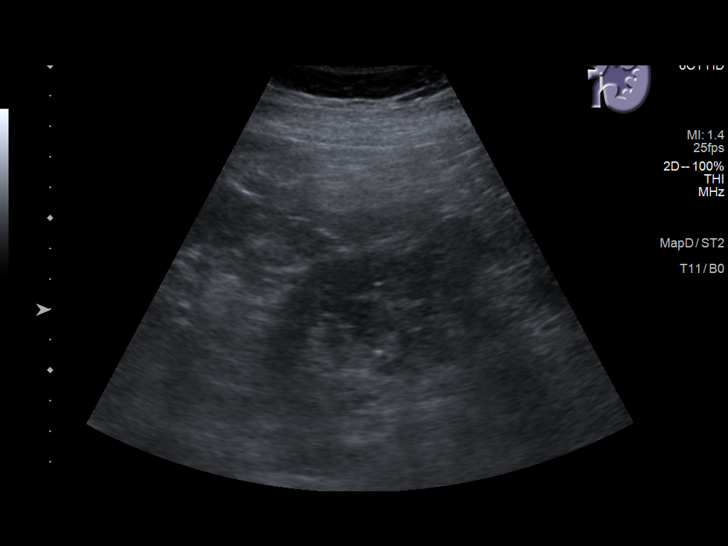
[im 26/39]
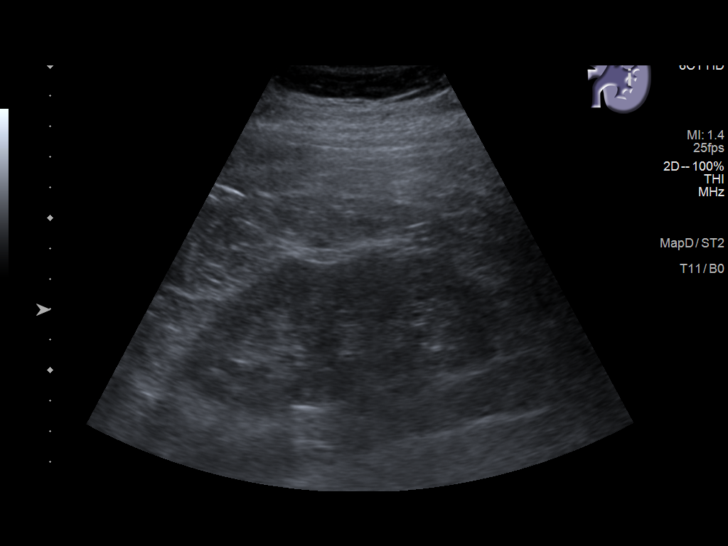
[im 29/39]
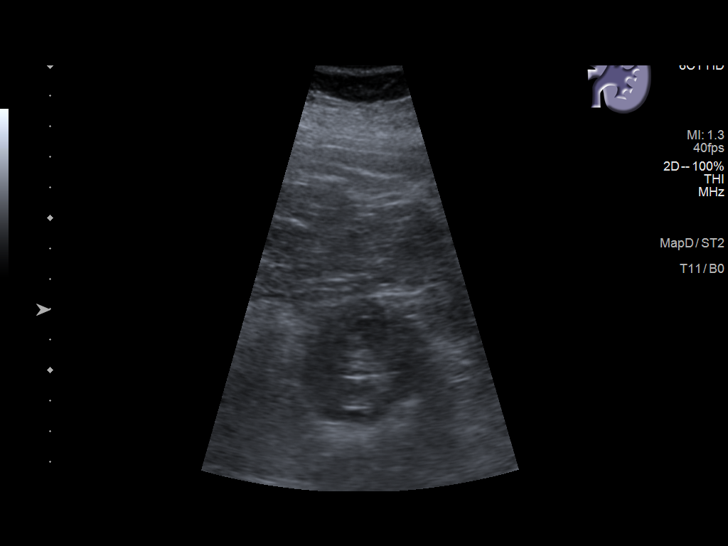
[im 32/39]
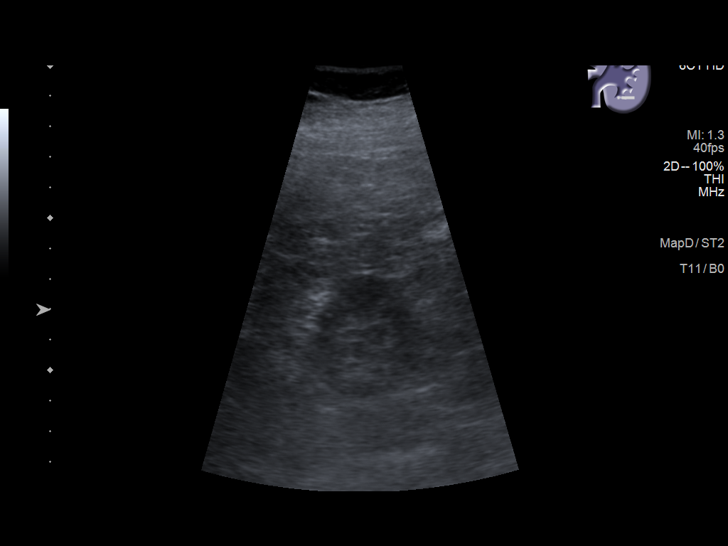
[im 35/39]
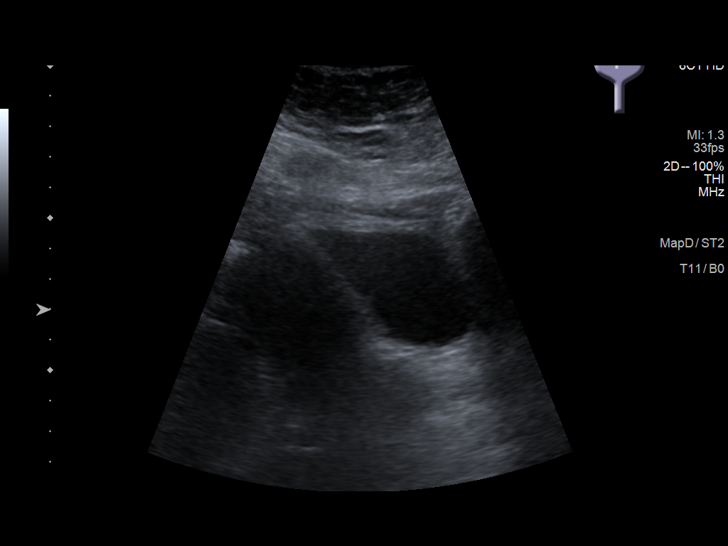
[im 39/39]
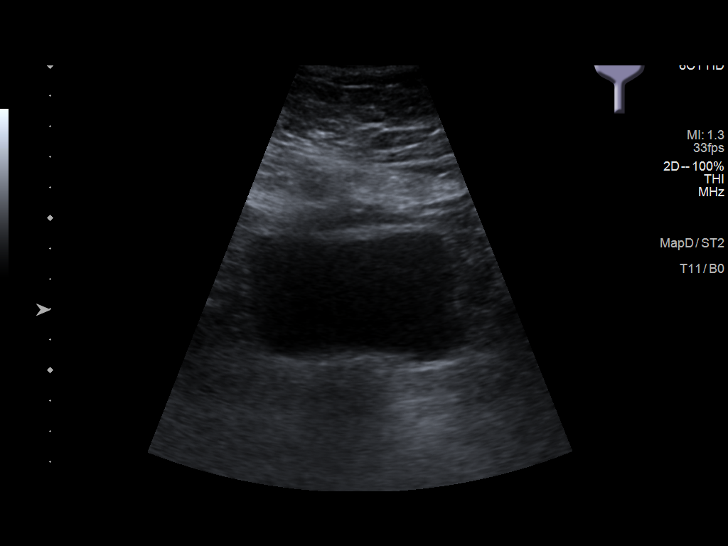

[14 of 25 positions shown; findings below may reference images not displayed]

FINDINGS: Right Kidney:

Renal measurements: 9.0 x 4.2 x 4.6 cm = volume: 90 mL .
Echogenicity within normal limits. No mass or hydronephrosis
visualized.

Left Kidney:

Renal measurements: 10.8 x 4.9 x 5.5 cm = volume: 149 mL.
Echogenicity within normal limits. No mass or hydronephrosis
visualized.

Bladder:

Appears normal for degree of bladder distention.

Other:

None.
IMPRESSION: Normal appearance of both kidneys.  No evidence of hydronephrosis.

## 2020-05-02 NOTE — Progress Notes (Signed)
Cardiology Office Note    Date:  05/04/2020   ID:  Courtney, Roth [birth year], MRN [id number]  PCP:  Courtney Carls, MD  Cardiologist:  Courtney Sacramento, MD  Electrophysiologist:  None   Chief Complaint: Follow-up  History of Present Illness:   Courtney Roth is a 74 y.o. female with history of diastolic dysfunction, CKD stage III, HTN, HLD, ulcerative colitis, chronic back pain, morbid obesity, prior tobacco use quitting greater than 20 years ago, and GERD who presents for follow-up of diastolic dysfunction.  Prior remote injury to her back in 2006 with subsequent significant weight gain thereafter of approximately 100 pounds.  She was evaluated for shortness of breath and atypical chest pain in 2019 and underwent a treadmill nuclear stress test with her PCP and was able to exercise for only 2 minutes and 13 seconds.  She achieved 85% of her maximal predicted heart rate.  There was a moderate apical and anteroseptal defect which was reversible and suggestive of ischemia.  Echo in 05/2018 showed a low normal EF of 50 to 55%, unable to exclude regional wall motion abnormalities, grade 1 diastolic dysfunction, mild mitral regurgitation, normal RV systolic function, normal PASP.  She was evaluated by cardiology and declined diagnostic LHC and in this setting, she was medically managed.  She had improvement in her symptoms with addition of metoprolol.  She was last seen in the office in 09/2019 and was doing well from a cardiac perspective.  She did note some DOE which she attributed to being overweight.  More recently, she was evaluated by her PCP for several month history of intermittent right shoulder blade discomfort.  Chest x-ray on 04/02/2020 showed no acute cardiopulmonary findings.  CT chest on 04/16/2020 was unrevealing outside of coronary artery and aortic valve calcification.  She also reports undergoing a nuclear stress test with her PCP just last week with the results pending.   She has follow-up with him next week to discuss this.  We do not have the stress test for review at this time.  She comes in doing well from a cardiac perspective.  She denies any chest pain, dyspnea, palpitations, dizziness, presyncope, syncope, lower extremity swelling, abdominal tension, orthopnea, or PND.  She notes her right shoulder blade discomfort has been randomly occurring though is improving.  She is tolerating all medications without issues.  She does feel like her Crestor was recently titrated though is not certain.  She indicates she has follow-up with her PCP for this next week.  She does not have any issues or concerns at this time.   Labs independently reviewed: 02/2020 - magnesium 1.8, BUN 28, serum creatinine 1.16, potassium 4.4, albumin 4.3 09/2019 - AST/ALT normal, direct LDL 120, TC 197, TG 188, HDL 45  Past Medical History:  Diagnosis Date  . Colon polyps 03/04/2007   History of Hyperplastic  . Dyspnea   . GERD (gastroesophageal reflux disease)   . Hyperlipidemia   . Hypertension   . Obesity   . Pneumonia   . Stress reaction   . Ulcerative colitis     Past Surgical History:  Procedure Laterality Date  . COLONOSCOPY WITH PROPOFOL N/A 11/19/2018   Procedure: COLONOSCOPY WITH PROPOFOL;  Surgeon: Courtney Manifold, MD;  Location: [facility] ENDOSCOPY;  Service: Endoscopy;  Laterality: N/A;  . ruptured tubal pregnancy  1975   1 ovary removed    Current Medications: Current Meds  Medication Sig  . acetaminophen (TYLENOL) 500 MG tablet Take 1,000 mg  by mouth every 6 (six) hours as needed for mild pain (patient takes 1 - 2 depending on activity level).  Marland Kitchen aspirin 81 MG tablet Take 81 mg by mouth daily.    . Cholecalciferol (VITAMIN D3) 1000 UNITS CAPS Take 1 capsule by mouth daily.   . hydrochlorothiazide (HYDRODIURIL) 25 MG tablet Take 25 mg by mouth daily.  Marland Kitchen levothyroxine (SYNTHROID) 75 MCG tablet Take 1 tablet by mouth daily.  Marland Kitchen lisinopril (ZESTRIL) 10 MG tablet  Take 10 mg by mouth daily.  . metoprolol tartrate (LOPRESSOR) 25 MG tablet TAKE 1 TABLET BY MOUTH TWICE A DAY  . rosuvastatin (CRESTOR) 20 MG tablet Take 1 tablet (20 mg total) by mouth daily.  Marland Kitchen sulfaSALAzine (AZULFIDINE) 500 MG tablet Take 2 tablets (1,000 mg total) by mouth 2 (two) times daily.  . traMADol (ULTRAM) 50 MG tablet Take 1-2 tablets by mouth 3 (three) times daily. Patient states she only takes 3 per day    Allergies:   Codeine, Metoprolol, Metoprolol succinate, and Other   Social History   Socioeconomic History  . Marital status: Married    Spouse name: Not on file  . Number of children: Not on file  . Years of education: Not on file  . Highest education level: Not on file  Occupational History  . Not on file  Tobacco Use  . Smoking status: Former Smoker    Types: Cigarettes    Quit date: 10/27/2006    Years since quitting: 13.5  . Smokeless tobacco: Never Used  Vaping Use  . Vaping Use: Never used  Substance and Sexual Activity  . Alcohol use: Yes    Alcohol/week: 0.0 standard drinks    Comment: rare  . Drug use: No  . Sexual activity: Not on file  Other Topics Concern  . Not on file  Social History Narrative  . Not on file   Social Determinants of Health   Financial Resource Strain:   . Difficulty of Paying Living Expenses:   Food Insecurity:   . Worried About Charity fundraiser in the Last Year:   . Arboriculturist in the Last Year:   Transportation Needs:   . Film/video editor (Medical):   Marland Kitchen Lack of Transportation (Non-Medical):   Physical Activity:   . Days of Exercise per Week:   . Minutes of Exercise per Session:   Stress:   . Feeling of Stress :   Social Connections:   . Frequency of Communication with Friends and Family:   . Frequency of Social Gatherings with Friends and Family:   . Attends Religious Services:   . Active Member of Clubs or Organizations:   . Attends Archivist Meetings:   Marland Kitchen Marital Status:       Family History:  The patient's family history includes Breast cancer in her cousin and maternal aunt; Clotting disorder in her mother; Crohn's disease in her sister; Diabetes in her paternal uncle; GER disease in her mother; Heart disease in her father and mother; Hypertension in her mother; Osteoporosis in her mother. There is no history of Colon cancer.  ROS:   Review of Systems  Constitutional: Negative for chills, diaphoresis, fever, malaise/fatigue and weight loss.  HENT: Negative for congestion.   Eyes: Negative for discharge and redness.  Respiratory: Negative for cough, sputum production, shortness of breath and wheezing.   Cardiovascular: Negative for chest pain, palpitations, orthopnea, claudication, leg swelling and PND.  Gastrointestinal: Negative for abdominal pain, heartburn, nausea  and vomiting.  Musculoskeletal: Positive for joint pain. Negative for back pain, falls and myalgias.       Right shoulder blade discomfort  Skin: Negative for rash.  Neurological: Negative for dizziness, tingling, tremors, sensory change, speech change, focal weakness, loss of consciousness and weakness.  Endo/Heme/Allergies: Does not bruise/bleed easily.  Psychiatric/Behavioral: Negative for substance abuse. The patient is not nervous/anxious.   All other systems reviewed and are negative.    EKGs/Labs/Other Studies Reviewed:    Studies reviewed were summarized above. The additional studies were reviewed today:  2D echo 05/2018: - Left ventricle: The cavity size was normal. Systolic function was  normal. The estimated ejection fraction was in the range of 50%  to 55%. Regional wall motion abnormalities cannot be excluded.  Doppler parameters are consistent with abnormal left ventricular  relaxation (grade 1 diastolic dysfunction).  - Mitral valve: There was mild regurgitation.  - Left atrium: The atrium was normal in size.  - Right ventricle: Systolic function was normal.  -  Pulmonary arteries: Systolic pressure was within the normal  range.   Impressions:   - Challenging image quality, consider definity.   EKG:  EKG is ordered today.  The EKG ordered today demonstrates sinus bradycardia, 51 bpm, baseline artifact, no acute ST-T changes  Recent Labs: 10/18/2019: ALT 20; BUN 36; Creatinine, Ser 1.12; Potassium 5.0; Sodium 142  Recent Lipid Panel    Component Value Date/Time   CHOL 197 10/18/2019 1515   TRIG 188 (H) 10/18/2019 1515   HDL 45 10/18/2019 1515   CHOLHDL 4.4 10/18/2019 1515   VLDL 38 10/18/2019 1515   LDLCALC 114 (H) 10/18/2019 1515   LDLDIRECT 120.4 (H) 10/18/2019 1515    PHYSICAL EXAM:    VS:  BP (!) 142/72 (BP Location: Right Arm, Patient Position: Sitting, Cuff Size: Normal)   Pulse (!) 51   Ht 5' (1.524 m)   Wt 247 lb (112 kg)   SpO2 97%   BMI 48.24 kg/m   BMI: Body mass index is 48.24 kg/m.  Physical Exam Constitutional:      Appearance: She is well-developed.  HENT:     Head: Normocephalic and atraumatic.  Eyes:     General:        Right eye: No discharge.        Left eye: No discharge.  Neck:     Vascular: No JVD.  Cardiovascular:     Rate and Rhythm: Regular rhythm. Bradycardia present.     Pulses: No midsystolic click and no opening snap.          Dorsalis pedis pulses are 2+ on the right side and 2+ on the left side.       Posterior tibial pulses are 2+ on the right side and 2+ on the left side.     Heart sounds: Normal heart sounds, S1 normal and S2 normal. Heart sounds not distant. No murmur heard.  No friction rub.  Pulmonary:     Effort: Pulmonary effort is normal. No respiratory distress.     Breath sounds: Normal breath sounds. No decreased breath sounds, wheezing or rales.  Chest:     Chest wall: No tenderness.  Abdominal:     General: There is no distension.     Palpations: Abdomen is soft.     Tenderness: There is no abdominal tenderness.  Musculoskeletal:     Cervical back: Normal range of  motion.  Skin:    General: Skin is warm and dry.  Nails: There is no clubbing.  Neurological:     Mental Status: She is alert and oriented to person, place, and time.  Psychiatric:        Speech: Speech normal.        Behavior: Behavior normal.        Thought Content: Thought content normal.        Judgment: Judgment normal.     Wt Readings from Last 3 Encounters:  05/04/20 247 lb (112 kg)  10/18/19 238 lb 12 oz (108.3 kg)  03/08/19 234 lb (106.1 kg)     ASSESSMENT & PLAN:   1. Diastolic dysfunction: She appears euvolemic and well compensated.  She denies any dyspnea.  Remains on thiazide diuretic.  Optimal BP and heart rate control recommended.  2. 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  Over the past several months she has noted intermittent right shoulder discomfort with recent nuclear stress test through her PCPs office last week with results pending.  Should these results be concerning for ischemia she is not open to pursuing diagnostic LHC.  This was discussed with her in detail today and she is willing to proceed if needed.  Further recommendations pending recent stress test and patient follow-up with PCP next week.  Risks and benefits of cardiac catheterization have been discussed with the patient including risks of bleeding, bruising, infection, kidney damage, stroke, heart attack, urgent need for bypass, injury to limb, and death. The patient understands these risks and is willing to proceed with the procedure. All questions have been answered and concerns listened to.   3. HTN: Blood pressure is reasonably controlled in the setting of recent right shoulder discomfort.  Continue current therapy.  She is mildly bradycardic though is asymptomatic.  4. HLD: LDL 124 09/2019.  Recent chest CT demonstrating coronary artery calcification.   Goal LDL less than 70.  She feels like her Crestor was recently titrated though does not recall the specifics of this.  No records on file for review.  Recommend she follow-up with PCP regarding titration of Crestor.  5. CKD stage III: Stable on most recent check.  Followed by nephrology.  Disposition: F/u with Dr. Fletcher Anon or an APP in 3 months, sooner if needed based on our review of the patient's recent stress test.   Medication Adjustments/Labs and Tests Ordered: Current medicines are reviewed at length with the patient today.  Concerns regarding medicines are outlined above. Medication changes, Labs and Tests ordered today are summarized above and listed in the Patient Instructions accessible in Encounters.   Signed, Christell Faith, PA-C 05/04/2020 9:16 AM     [address] [place] [address], [place] [address] [phone]

## 2020-05-04 ENCOUNTER — Other Ambulatory Visit: Payer: Self-pay

## 2020-05-04 ENCOUNTER — Ambulatory Visit (INDEPENDENT_AMBULATORY_CARE_PROVIDER_SITE_OTHER): Payer: Medicare Other | Admitting: Physician Assistant

## 2020-05-04 ENCOUNTER — Encounter: Payer: Self-pay | Admitting: Physician Assistant

## 2020-05-04 VITALS — BP 142/72 | HR 51 | Ht 60.0 in | Wt 247.0 lb

## 2020-05-04 DIAGNOSIS — R9439 Abnormal result of other cardiovascular function study: Secondary | ICD-10-CM | POA: Diagnosis not present

## 2020-05-04 DIAGNOSIS — E785 Hyperlipidemia, unspecified: Secondary | ICD-10-CM

## 2020-05-04 DIAGNOSIS — I10 Essential (primary) hypertension: Secondary | ICD-10-CM | POA: Diagnosis not present

## 2020-05-04 DIAGNOSIS — N183 Chronic kidney disease, stage 3 unspecified: Secondary | ICD-10-CM

## 2020-05-04 DIAGNOSIS — I5189 Other ill-defined heart diseases: Secondary | ICD-10-CM | POA: Diagnosis not present

## 2020-05-04 DIAGNOSIS — I2510 Atherosclerotic heart disease of native coronary artery without angina pectoris: Secondary | ICD-10-CM | POA: Diagnosis not present

## 2020-05-04 NOTE — Patient Instructions (Signed)
Medication Instructions:  No changes  *If you need a refill on your cardiac medications before your next appointment, please call your pharmacy*   Lab Work: None  If you have labs (blood work) drawn today and your tests are completely normal, you will receive your results only by: Marland Kitchen MyChart Message (if you have MyChart) OR . A paper copy in the mail If you have any lab test that is abnormal or we need to change your treatment, we will call you to review the results.   Testing/Procedures: None   Follow-Up: At [facility], you and your health needs are our priority.  As part of our continuing mission to provide you with exceptional heart care, we have created designated Provider Care Teams.  These Care Teams include your primary Cardiologist (physician) and Advanced Practice Providers (APPs -  Physician Assistants and Nurse Practitioners) who all work together to provide you with the care you need, when you need it.  Your next appointment:   3 month(s)  The format for your next appointment:   In Person  Provider:    You may see Kathlyn Sacramento, MD or one of the following Advanced Practice Providers on your designated Care Team:    Murray Hodgkins, NP  Christell Faith, PA-C  Marrianne Mood, PA-C

## 2020-05-26 ENCOUNTER — Other Ambulatory Visit: Payer: Self-pay | Admitting: Cardiovascular Disease

## 2020-08-09 ENCOUNTER — Ambulatory Visit (INDEPENDENT_AMBULATORY_CARE_PROVIDER_SITE_OTHER): Payer: Medicare Other | Admitting: Cardiovascular Disease

## 2020-08-09 ENCOUNTER — Other Ambulatory Visit: Payer: Self-pay

## 2020-08-09 ENCOUNTER — Encounter: Payer: Self-pay | Admitting: Cardiovascular Disease

## 2020-08-09 VITALS — BP 100/60 | HR 64 | Ht 59.75 in | Wt 240.1 lb

## 2020-08-09 DIAGNOSIS — I5189 Other ill-defined heart diseases: Secondary | ICD-10-CM | POA: Diagnosis not present

## 2020-08-09 DIAGNOSIS — I2510 Atherosclerotic heart disease of native coronary artery without angina pectoris: Secondary | ICD-10-CM

## 2020-08-09 DIAGNOSIS — I10 Essential (primary) hypertension: Secondary | ICD-10-CM | POA: Diagnosis not present

## 2020-08-09 DIAGNOSIS — E785 Hyperlipidemia, unspecified: Secondary | ICD-10-CM

## 2020-08-09 NOTE — Patient Instructions (Signed)
Medication Instructions:  - Your physician recommends that you continue on your current medications as directed. Please refer to the Current Medication list given to you today.  *If you need a refill on your cardiac medications before your next appointment, please call your pharmacy*   Lab Work: - none ordered  If you have labs (blood work) drawn today and your tests are completely normal, you will receive your results only by: Marland Kitchen MyChart Message (if you have MyChart) OR . A paper copy in the mail If you have any lab test that is abnormal or we need to change your treatment, we will call you to review the results.   Testing/Procedures: - none ordered   Follow-Up: At [facility], you and your health needs are our priority.  As part of our continuing mission to provide you with exceptional heart care, we have created designated Provider Care Teams.  These Care Teams include your primary Cardiologist (physician) and Advanced Practice Providers (APPs -  Physician Assistants and Nurse Practitioners) who all work together to provide you with the care you need, when you need it.  We recommend signing up for the patient portal called "MyChart".  Sign up information is provided on this After Visit Summary.  MyChart is used to connect with patients for Virtual Visits (Telemedicine).  Patients are able to view lab/test results, encounter notes, upcoming appointments, etc.  Non-urgent messages can be sent to your provider as well.   To learn more about what you can do with MyChart, go to [email or web address].    Your next appointment:   6 month(s)  The format for your next appointment:   In Person  Provider:   You may see Kathlyn Sacramento, MD or one of the following Advanced Practice Providers on your designated Care Team:    Murray Hodgkins, NP  Christell Faith, PA-C  Marrianne Mood, PA-C  Cadence Kathlen Mody, [place]    Other Instructions

## 2020-08-09 NOTE — Progress Notes (Signed)
Cardiology Office Note   Date:  08/09/2020   ID:  Courtney, Roth 1945-11-04, MRN [id number]  PCP:  Casilda Carls, MD  Cardiologist:   Kathlyn Sacramento, MD   Chief Complaint  Patient presents with   other    3 month follow up. Meds reviewed by the pt. verbally. "doing well."       History of Present Illness: Courtney Roth is a 74 y.o. female who is here today for follow-up visit regarding regarding abnormal nuclear stress test and possible underlying ischemic heart disease.  She has chronic medical conditions that include hypertension, chronic back pain, morbid obesity, hyperlipidemia, stage III chronic kidney disease, essential hypertension ulcerative colitis and GERD. She is a previous smoker and quit about 20 years ago. She had an injury to her back in 2006 and she reports that she gained 100 pounds since then.  She had shortness of breath and atypical chest pain in 2019. She underwent a treadmill nuclear stress test with Dr.Jadali.  She was able to exercise for only 2 minutes and 13 seconds.  She achieved 85% maximal predicted heart rate.  There was moderate apical and anteroseptal defect which was reversible and suggestive of ischemia.  The patient declined cardiac catheterization and has been relatively stable since then.  She is being treated for presumed underlying coronary artery disease. Echocardiogram in 2019 showed an EF of 50 to 55% with grade 1 diastolic dysfunction and mild mitral regurgitation.  Pulmonary pressure could not be estimated.  She had sinus tachycardia that improved with addition of metoprolol. She has been doing reasonably well and denies chest pain, palpitations or worsening dyspnea.     Past Medical History:  Diagnosis Date   Colon polyps 03/04/2007   History of Hyperplastic   Dyspnea    GERD (gastroesophageal reflux disease)    Hyperlipidemia    Hypertension    Obesity    Pneumonia    Stress reaction    Ulcerative  colitis     Past Surgical History:  Procedure Laterality Date   COLONOSCOPY WITH PROPOFOL N/A 11/19/2018   Procedure: COLONOSCOPY WITH PROPOFOL;  Surgeon: Virgel Manifold, MD;  Location: [facility] ENDOSCOPY;  Service: Endoscopy;  Laterality: N/A;   ruptured tubal pregnancy  1975   1 ovary removed     Current Outpatient Medications  Medication Sig Dispense Refill   acetaminophen (TYLENOL) 500 MG tablet Take 1,000 mg by mouth every 6 (six) hours as needed for mild pain (patient takes 1 - 2 depending on activity level).     aspirin 81 MG tablet Take 81 mg by mouth daily.       Cholecalciferol (VITAMIN D3) 1000 UNITS CAPS Take 1 capsule by mouth daily.      hydrochlorothiazide (HYDRODIURIL) 25 MG tablet Take 25 mg by mouth daily.     levothyroxine (SYNTHROID) 75 MCG tablet Take 1 tablet by mouth daily.     lisinopril (ZESTRIL) 10 MG tablet Take 10 mg by mouth daily.     metoprolol tartrate (LOPRESSOR) 25 MG tablet TAKE 1 TABLET BY MOUTH TWICE A DAY 180 tablet 3   rosuvastatin (CRESTOR) 20 MG tablet Take 1 tablet (20 mg total) by mouth daily. 90 tablet 3   sulfaSALAzine (AZULFIDINE) 500 MG tablet Take 2 tablets (1,000 mg total) by mouth 2 (two) times daily. 120 tablet 3   traMADol (ULTRAM) 50 MG tablet Take 1-2 tablets by mouth 3 (three) times daily. Patient states she only takes 3 per day  1   No current facility-administered medications for this visit.    Allergies:   Codeine, Metoprolol, Metoprolol succinate, and Other    Social History:  The patient  reports that she quit smoking about 13 years ago. Her smoking use included cigarettes. She has never used smokeless tobacco. She reports current alcohol use. She reports that she does not use drugs.   Family History:  The patient's family history includes Breast cancer in her cousin and maternal aunt; Clotting disorder in her mother; Crohn's disease in her sister; Diabetes in her paternal uncle; GER disease in her mother; Heart  disease in her father and mother; Hypertension in her mother; Osteoporosis in her mother.    ROS:  Please see the history of present illness.   Otherwise, review of systems are positive for none.   All other systems are reviewed and negative.    PHYSICAL EXAM: VS:  BP 100/60 (BP Location: Right Wrist, Patient Position: Sitting, Cuff Size: Normal)    Pulse 64    Ht 4' 11.75" (1.518 m)    Wt 240 lb 2 oz (108.9 kg)    SpO2 96%    BMI 47.29 kg/m  , BMI Body mass index is 47.29 kg/m. GEN: Well nourished, well developed, in no acute distress  HEENT: normal  Neck: no JVD, carotid bruits, or masses Cardiac: RRR; no murmurs, rubs, or gallops,no edema  Respiratory:  clear to auscultation bilaterally, normal work of breathing GI: soft, nontender, nondistended, + BS MS: no deformity or atrophy  Skin: warm and dry, no rash Neuro:  Strength and sensation are intact Psych: euthymic mood, full affect   EKG:  EKG is ordered today. The ekg ordered today demonstrates normal sinus rhythm with low voltage and nonspecific T wave changes.   Recent Labs: 10/18/2019: ALT 20; BUN 36; Creatinine, Ser 1.12; Potassium 5.0; Sodium 142    Lipid Panel    Component Value Date/Time   CHOL 197 10/18/2019 1515   TRIG 188 (H) 10/18/2019 1515   HDL 45 10/18/2019 1515   CHOLHDL 4.4 10/18/2019 1515   VLDL 38 10/18/2019 1515   LDLCALC 114 (H) 10/18/2019 1515   LDLDIRECT 120.4 (H) 10/18/2019 1515      Wt Readings from Last 3 Encounters:  08/09/20 240 lb 2 oz (108.9 kg)  05/04/20 247 lb (112 kg)  10/18/19 238 lb 12 oz (108.3 kg)       PAD Screen 06/22/2018  Previous PAD dx? No  Previous surgical procedure? No  Pain with walking? No  Feet/toe relief with dangling? No  Painful, non-healing ulcers? No  Extremities discolored? No      ASSESSMENT AND PLAN:  1.  Possible chronic ischemic heart disease with abnormal nuclear stress test: Stable symptoms overall.  Continuing medical therapy with aspirin,  metoprolol and a statin.  2.  Essential hypertension: Blood pressure is controlled on current medications.  3.  Hyperlipidemia: Continue rosuvastatin with a target LDL of less than 70.  4.  Morbid obesity: She has not been able to lose much weight.  Disposition:   FU with me in 6 months  Signed,  Kathlyn Sacramento, MD  08/09/2020 5:10 PM     [facility]

## 2020-12-03 ENCOUNTER — Telehealth: Payer: Medicare Other | Admitting: Physician Assistant

## 2020-12-03 ENCOUNTER — Encounter: Payer: Self-pay | Admitting: Physician Assistant

## 2020-12-03 DIAGNOSIS — J208 Acute bronchitis due to other specified organisms: Secondary | ICD-10-CM | POA: Diagnosis not present

## 2020-12-03 DIAGNOSIS — B9689 Other specified bacterial agents as the cause of diseases classified elsewhere: Secondary | ICD-10-CM | POA: Diagnosis not present

## 2020-12-03 MED ORDER — DOXYCYCLINE HYCLATE 100 MG PO TABS: 100.0000 mg | ORAL_TABLET | Freq: Two times a day (BID) | ORAL | 0 refills | Status: DC

## 2020-12-03 MED ORDER — BENZONATATE 100 MG PO CAPS: 100.0000 mg | ORAL_CAPSULE | Freq: Three times a day (TID) | ORAL | 0 refills | Status: DC | PRN

## 2020-12-03 NOTE — Patient Instructions (Signed)
Instructions are sent to MyChart.

## 2020-12-03 NOTE — Progress Notes (Signed)
Courtney Roth, Courtney Roth are scheduled for a virtual visit with your provider today.    Just as we do with appointments in the office, we must obtain your consent to participate.  Your consent will be active for this visit and any virtual visit you may have with one of our providers in the next 365 days.    If you have a MyChart account, I can also send a copy of this consent to you electronically.  All virtual visits are billed to your insurance company just like a traditional visit in the office.  As this is a virtual visit, video technology does not allow for your provider to perform a traditional examination.  This may limit your provider's ability to fully assess your condition.  If your provider identifies any concerns that need to be evaluated in person or the need to arrange testing such as labs, EKG, etc, we will make arrangements to do so.    Although advances in technology are sophisticated, we cannot ensure that it will always work on either your end or our end.  If the connection with a video visit is poor, we may have to switch to a telephone visit.  With either a video or telephone visit, we are not always able to ensure that we have a secure connection.   I need to obtain your verbal consent now.   Are you willing to proceed with your visit today?   Courtney Roth has provided verbal consent on 12/03/2020 for a virtual visit (video or telephone).  Leeanne Rio, PA-C 12/03/2020  11:54 AM  Virtual Visit via Video   I connected with patient on 12/03/20 at 12:00 PM EST by a video enabled telemedicine application and verified that I am speaking with the correct person using two identifiers.  Location patient: Home Location provider: Post participating in the virtual visit: Patient, Patient's Son, Provider  I discussed the limitations of evaluation and management by telemedicine and the availability of in person appointments. The patient expressed  understanding and agreed to proceed.  Subjective:   HPI:   Patient presents via [facility] today c/o 2 weeks of chest congestion with a cough that is productive of cloudy yellow phlegm. Notes initially with fever but none recently as far as she can tell. Denies sinus pain, ear pain or tooth pain. Some initial sore throat that has resolved. Some tenderness of chest wall with cough. Notes she was lightheaded the other day but none today. Is concerned about COVID but did not get tested. Has been taking Mucinex to help with congestion, tylenol for headache.  Has noted diarrhea. Is on lisinopril and HCTZ for BP which she has continued to take. Has not reached out to her PCP  ROS:   See pertinent positives and negatives per HPI.  Patient Active Problem List   Diagnosis Date Noted  . Internal hemorrhoids   . Benign neoplasm of cecum   . Benign neoplasm of ascending colon   . Chronic pain syndrome 01/11/2018  . Pain syndrome, chronic 12/14/2017  . DDD (degenerative disc disease), lumbar 10/12/2017  . Localized osteoarthritis of hands, bilateral 10/12/2017  . Osteoarthritis of both knees 10/12/2017  . UC (ulcerative colitis) ([place]) 10/24/2015  . History of colonic polyps 05/29/2015  . Hearing problem 03/19/2015  . Osteoarthritis of right knee 10/03/2014  . Right knee pain 10/02/2014  . Low back pain 08/09/2013  . Lumbar degenerative disc disease 02/08/2013  . Arm pain, anterior 12/26/2011  .  Hyperglycemia 03/31/2011  . Numbness in feet 03/31/2011  . DIVERTICULITIS OF COLON 07/09/2010  . History of pancreatitis 07/09/2010  . Low back pain radiating to left leg 07/09/2010  . VITAMIN B12 DEFICIENCY 01/08/2010  . Personal history of colonic polyps 01/08/2010  . EDEMA LEG 11/10/2008  . Obesity 01/31/2008  . DYSPNEA ON EXERTION 03/12/2007  . Hyperlipidemia 03/01/2007  . Essential hypertension 03/01/2007  . GERD 03/01/2007  . 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 TOBACCO USE, QUIT  03/01/2007    Social History   Tobacco Use  . Smoking status: Former Smoker    Types: Cigarettes    Quit date: 10/27/2006    Years since quitting: 14.1  . Smokeless tobacco: Never Used  Substance Use Topics  . Alcohol use: Yes    Alcohol/week: 0.0 standard drinks    Comment: rare    Current Outpatient Medications:  .  acetaminophen (TYLENOL) 500 MG tablet, Take 1,000 mg by mouth every 6 (six) hours as needed for mild pain (patient takes 1 - 2 depending on activity level)., Disp: , Rfl:  .  aspirin 81 MG tablet, Take 81 mg by mouth daily.  , Disp: , Rfl:  .  Cholecalciferol (VITAMIN D3) 1000 UNITS CAPS, Take 1 capsule by mouth daily. , Disp: , Rfl:  .  hydrochlorothiazide (HYDRODIURIL) 25 MG tablet, Take 25 mg by mouth daily., Disp: , Rfl:  .  levothyroxine (SYNTHROID) 75 MCG tablet, Take 1 tablet by mouth daily., Disp: , Rfl:  .  lisinopril (ZESTRIL) 10 MG tablet, Take 10 mg by mouth daily., Disp: , Rfl:  .  metoprolol tartrate (LOPRESSOR) 25 MG tablet, TAKE 1 TABLET BY MOUTH TWICE A DAY, Disp: 180 tablet, Rfl: 3 .  rosuvastatin (CRESTOR) 20 MG tablet, Take 1 tablet (20 mg total) by mouth daily., Disp: 90 tablet, Rfl: 3 .  sulfaSALAzine (AZULFIDINE) 500 MG tablet, Take 2 tablets (1,000 mg total) by mouth 2 (two) times daily., Disp: 120 tablet, Rfl: 3 .  traMADol (ULTRAM) 50 MG tablet, Take 1-2 tablets by mouth 3 (three) times daily. Patient states she only takes 3 per day, Disp: , Rfl: 1  Allergies  Allergen Reactions  . Codeine Other (See Comments)    Makes pt feel Crazy; Anxious   . Metoprolol Other (See Comments)  . Metoprolol Succinate     REACTION: sob, fatigue  . Other Other (See Comments)    Objective:   There were no vitals taken for this visit.  Patient is well-developed, well-nourished in no acute distress.  Resting comfortably at home.  Head is normocephalic, atraumatic.  No labored breathing.  Speech is clear and coherent with logical content.  Patient is  alert and oriented at baseline.   Assessment and Plan:   1. Acute bacterial bronchitis Most likely secondary to COVID but with 2 week duration no indication for testing as she is outside window for antivirals or MAB. No fever or SOB thankfully. Some residual loose stool but tolerating PO fluids. Reviewed with her the need to hold her ACEI and thiazide if not hydrating well. She is to reach out to her PCP as well. Will start Doxycycline and tessalon. Supportive measures and OTC medications reviewed. Very strict ER precautions given to patient and reviewed with son.     Leeanne Rio, [place] 12/03/2020

## 2020-12-20 ENCOUNTER — Other Ambulatory Visit (HOSPITAL_COMMUNITY): Payer: Self-pay | Admitting: Internal Medicine

## 2020-12-20 ENCOUNTER — Other Ambulatory Visit: Payer: Self-pay | Admitting: Internal Medicine

## 2020-12-20 DIAGNOSIS — I3139 Other pericardial effusion (noninflammatory): Secondary | ICD-10-CM

## 2020-12-20 DIAGNOSIS — J189 Pneumonia, unspecified organism: Secondary | ICD-10-CM

## 2020-12-20 DIAGNOSIS — I313 Pericardial effusion (noninflammatory): Secondary | ICD-10-CM

## 2020-12-20 DIAGNOSIS — J918 Pleural effusion in other conditions classified elsewhere: Secondary | ICD-10-CM

## 2020-12-21 ENCOUNTER — Ambulatory Visit
Admission: RE | Admit: 2020-12-21 | Discharge: 2020-12-21 | Disposition: A | Discharge status: Home / Self Care | Payer: Medicare Other | Source: Ambulatory Visit | Attending: Internal Medicine | Admitting: Internal Medicine

## 2020-12-21 ENCOUNTER — Other Ambulatory Visit: Payer: Self-pay

## 2020-12-21 DIAGNOSIS — I313 Pericardial effusion (noninflammatory): Secondary | ICD-10-CM | POA: Diagnosis present

## 2020-12-21 DIAGNOSIS — I3139 Other pericardial effusion (noninflammatory): Secondary | ICD-10-CM

## 2020-12-21 DIAGNOSIS — J918 Pleural effusion in other conditions classified elsewhere: Secondary | ICD-10-CM | POA: Diagnosis present

## 2020-12-21 DIAGNOSIS — J189 Pneumonia, unspecified organism: Secondary | ICD-10-CM | POA: Diagnosis present

## 2020-12-25 DIAGNOSIS — U071 COVID-19: Secondary | ICD-10-CM

## 2020-12-25 HISTORY — DX: COVID-19: U07.1

## 2020-12-27 ENCOUNTER — Ambulatory Visit (INDEPENDENT_AMBULATORY_CARE_PROVIDER_SITE_OTHER): Payer: Medicare Other | Admitting: Cardiovascular Disease

## 2020-12-27 ENCOUNTER — Encounter: Payer: Self-pay | Admitting: Cardiovascular Disease

## 2020-12-27 ENCOUNTER — Other Ambulatory Visit: Payer: Self-pay

## 2020-12-27 VITALS — BP 120/68 | HR 67 | Ht 59.75 in | Wt 227.0 lb

## 2020-12-27 DIAGNOSIS — E785 Hyperlipidemia, unspecified: Secondary | ICD-10-CM

## 2020-12-27 DIAGNOSIS — R0602 Shortness of breath: Secondary | ICD-10-CM

## 2020-12-27 DIAGNOSIS — I10 Essential (primary) hypertension: Secondary | ICD-10-CM | POA: Diagnosis not present

## 2020-12-27 DIAGNOSIS — I2510 Atherosclerotic heart disease of native coronary artery without angina pectoris: Secondary | ICD-10-CM

## 2020-12-27 NOTE — Progress Notes (Signed)
Cardiology Office Note   Date:  12/27/2020   ID:  Courtney Roth, Courtney Roth Aug 07, 1946, MRN [id number]  PCP:  Casilda Carls, MD  Cardiologist:   Kathlyn Sacramento, MD   Chief Complaint  Patient presents with  . Follow-up    Changes on CT per Dr. Rosario Jacks      History of Present Illness: Courtney Roth is a 75 y.o. female who is here today for follow-up visit regarding regarding abnormal nuclear stress test and possible underlying ischemic heart disease.  She has chronic medical conditions that include hypertension, chronic back pain, morbid obesity, hyperlipidemia, stage III chronic kidney disease, essential hypertension ulcerative colitis and GERD. She is a previous smoker and quit about 20 years ago. She had an injury to her back in 2006 and she reports that she gained 100 pounds since then.  She had shortness of breath and atypical chest pain in 2019. She underwent a treadmill nuclear stress test in 2019 with Dr.Jadali.  She was able to exercise for only 2 minutes and 13 seconds.  She achieved 85% maximal predicted heart rate.  There was moderate apical and anteroseptal defect which was reversible and suggestive of ischemia.  The patient declined cardiac catheterization and has been relatively stable since then.  She is being treated for presumed underlying coronary artery disease. Echocardiogram in 2019 showed an EF of 50 to 55% with grade 1 diastolic dysfunction and mild mitral regurgitation.  Pulmonary pressure could not be estimated.  She had sinus tachycardia that improved with addition of metoprolol.  She was sick in January with Covid-like symptoms. She was not tested but continued to have symptoms of cough and shortness of breath for about 6 weeks. Since then, her breathing did not go back to baseline. She is not vaccinated for COVID-19. She was referred for CT scan of the lungs which showed multiple areas of focal densities consistent with resolving infectious/inflammatory  process. In addition, she was found to have coronary artery and aortic calcifications. I personally reviewed these images and the calcifications are overall mild.  She was seen by pulmonary and had COVID-19 antibodies tested which came back positive.   Past Medical History:  Diagnosis Date  . Colon polyps 03/04/2007   History of Hyperplastic  . Dyspnea   . GERD (gastroesophageal reflux disease)   . Hyperlipidemia   . Hypertension   . Obesity   . Pneumonia   . Stress reaction   . Ulcerative colitis     Past Surgical History:  Procedure Laterality Date  . COLONOSCOPY WITH PROPOFOL N/A 11/19/2018   Procedure: COLONOSCOPY WITH PROPOFOL;  Surgeon: Virgel Manifold, MD;  Location: [facility] ENDOSCOPY;  Service: Endoscopy;  Laterality: N/A;  . ruptured tubal pregnancy  1975   1 ovary removed     Current Outpatient Medications  Medication Sig Dispense Refill  . acetaminophen (TYLENOL) 500 MG tablet Take 1,000 mg by mouth every 6 (six) hours as needed for mild pain (patient takes 1 - 2 depending on activity level).    Marland Kitchen aspirin 81 MG tablet Take 81 mg by mouth daily.    . benzonatate (TESSALON) 100 MG capsule Take 1 capsule (100 mg total) by mouth 3 (three) times daily as needed for cough. 30 capsule 0  . Cholecalciferol (VITAMIN D3) 1000 UNITS CAPS Take 1 capsule by mouth daily.    Marland Kitchen doxycycline (VIBRA-TABS) 100 MG tablet Take 1 tablet (100 mg total) by mouth 2 (two) times daily. 14 tablet 0  .  levothyroxine (SYNTHROID) 75 MCG tablet Take 1 tablet by mouth daily.    . metoprolol tartrate (LOPRESSOR) 25 MG tablet TAKE 1 TABLET BY MOUTH TWICE A DAY 180 tablet 3  . sulfaSALAzine (AZULFIDINE) 500 MG tablet Take 2 tablets (1,000 mg total) by mouth 2 (two) times daily. 120 tablet 3  . traMADol (ULTRAM) 50 MG tablet Take 1-2 tablets by mouth 3 (three) times daily. Patient states she only takes 3 per day  1  . hydrochlorothiazide (HYDRODIURIL) 25 MG tablet Take 25 mg by mouth daily. (Patient not  taking: Reported on 12/27/2020)    . lisinopril (ZESTRIL) 10 MG tablet Take 10 mg by mouth daily. (Patient not taking: Reported on 12/27/2020)    . rosuvastatin (CRESTOR) 20 MG tablet Take 1 tablet (20 mg total) by mouth daily. 90 tablet 3   No current facility-administered medications for this visit.    Allergies:   Codeine, Metoprolol, Metoprolol succinate, and Other    Social History:  The patient  reports that she quit smoking about 14 years ago. Her smoking use included cigarettes. She has never used smokeless tobacco. She reports current alcohol use. She reports that she does not use drugs.   Family History:  The patient's family history includes Breast cancer in her cousin and maternal aunt; Clotting disorder in her mother; Crohn's disease in her sister; Diabetes in her paternal uncle; GER disease in her mother; Heart disease in her father and mother; Hypertension in her mother; Osteoporosis in her mother.    ROS:  Please see the history of present illness.   Otherwise, review of systems are positive for none.   All other systems are reviewed and negative.    PHYSICAL EXAM: VS:  BP 120/68   Pulse 67   Ht 4' 11.75" (1.518 m)   Wt 227 lb (103 kg)   BMI 44.70 kg/m  , BMI Body mass index is 44.7 kg/m. GEN: Well nourished, well developed, in no acute distress  HEENT: normal  Neck: no JVD, carotid bruits, or masses Cardiac: RRR; no murmurs, rubs, or gallops,no edema  Respiratory:  clear to auscultation bilaterally, normal work of breathing GI: soft, nontender, nondistended, + BS MS: no deformity or atrophy  Skin: warm and dry, no rash Neuro:  Strength and sensation are intact Psych: euthymic mood, full affect   EKG:  EKG is ordered today. The ekg ordered today demonstrates normal sinus rhythm with low voltage and nonspecific T wave changes.   Recent Labs: No results found for requested labs within last 8760 hours.    Lipid Panel    Component Value Date/Time   CHOL 197  10/18/2019 1515   TRIG 188 (H) 10/18/2019 1515   HDL 45 10/18/2019 1515   CHOLHDL 4.4 10/18/2019 1515   VLDL 38 10/18/2019 1515   LDLCALC 114 (H) 10/18/2019 1515   LDLDIRECT 120.4 (H) 10/18/2019 1515      Wt Readings from Last 3 Encounters:  12/27/20 227 lb (103 kg)  08/09/20 240 lb 2 oz (108.9 kg)  05/04/20 247 lb (112 kg)       PAD Screen 06/22/2018  Previous PAD dx? No  Previous surgical procedure? No  Pain with walking? No  Feet/toe relief with dangling? No  Painful, non-healing ulcers? No  Extremities discolored? No      ASSESSMENT AND PLAN:  1.  chronic ischemic heart disease with abnormal nuclear stress test in the past: In addition, recent CT of the lungs showed evidence of coronary atherosclerosis. Currently  with no convincing symptoms of angina.Continuing medical therapy with aspirin, metoprolol and a statin.  2.  Essential hypertension: Blood pressure is controlled on current medications.  3.  Hyperlipidemia: Continue rosuvastatin with a target LDL of less than 70.  4.   Status post COVID-19 pneumonia: Her symptoms are in January were consistent with COVID-19. She recently had antibiotics tested and came back positive. I suspect this is the likely culprit for continued shortness of breath but given persistent symptoms, I am going to repeat her echocardiogram.   Disposition:   FU with me in 6 months  Signed,  Kathlyn Sacramento, MD  12/27/2020 2:03 PM    [place]

## 2020-12-27 NOTE — Patient Instructions (Signed)
Medication Instructions:  Your physician recommends that you continue on your current medications as directed. Please refer to the Current Medication list given to you today.  *If you need a refill on your cardiac medications before your next appointment, please call your pharmacy*   Lab Work: None ordered If you have labs (blood work) drawn today and your tests are completely normal, you will receive your results only by: Marland Kitchen MyChart Message (if you have MyChart) OR . A paper copy in the mail If you have any lab test that is abnormal or we need to change your treatment, we will call you to review the results.   Testing/Procedures: Your physician has requested that you have an echocardiogram. Echocardiography is a painless test that uses sound waves to create images of your heart. It provides your doctor with information about the size and shape of your heart and how well your heart's chambers and valves are working. This procedure takes approximately one hour. There are no restrictions for this procedure.     Follow-Up: At [facility], you and your health needs are our priority.  As part of our continuing mission to provide you with exceptional heart care, we have created designated Provider Care Teams.  These Care Teams include your primary Cardiologist (physician) and Advanced Practice Providers (APPs -  Physician Assistants and Nurse Practitioners) who all work together to provide you with the care you need, when you need it.  We recommend signing up for the patient portal called "MyChart".  Sign up information is provided on this After Visit Summary.  MyChart is used to connect with patients for Virtual Visits (Telemedicine).  Patients are able to view lab/test results, encounter notes, upcoming appointments, etc.  Non-urgent messages can be sent to your provider as well.   To learn more about what you can do with MyChart, go to [email or web address].    Your next appointment:   6  month(s)  The format for your next appointment:   In Person  Provider:   You may see Kathlyn Sacramento, MD or one of the following Advanced Practice Providers on your designated Care Team:    Murray Hodgkins, NP  Christell Faith, PA-C  Marrianne Mood, PA-C  Cadence [place], [place]  Laurann Montana, NP    Other Instructions N/A

## 2021-01-01 NOTE — Addendum Note (Signed)
Addended by: Ronaldo Miyamoto on: 01/01/2021 02:55 PM   Modules accepted: Orders

## 2021-01-17 ENCOUNTER — Ambulatory Visit (INDEPENDENT_AMBULATORY_CARE_PROVIDER_SITE_OTHER): Payer: Medicare Other

## 2021-01-17 ENCOUNTER — Other Ambulatory Visit: Payer: Self-pay

## 2021-01-17 DIAGNOSIS — R0602 Shortness of breath: Secondary | ICD-10-CM | POA: Diagnosis not present

## 2021-01-17 LAB — ECHOCARDIOGRAM COMPLETE
AR max vel: 2.19 cm2
AV Area VTI: 2.39 cm2
AV Area mean vel: 2.5 cm2
AV Mean grad: 4 mmHg
AV Peak grad: 9.6 mmHg
Ao pk vel: 1.55 m/s
Area-P 1/2: 2.62 cm2
S' Lateral: 2.7 cm

## 2021-01-29 ENCOUNTER — Other Ambulatory Visit: Payer: Self-pay

## 2021-01-29 ENCOUNTER — Ambulatory Visit (INDEPENDENT_AMBULATORY_CARE_PROVIDER_SITE_OTHER): Payer: Medicare Other | Admitting: Cardiovascular Disease

## 2021-01-29 ENCOUNTER — Encounter: Payer: Self-pay | Admitting: Cardiovascular Disease

## 2021-01-29 VITALS — BP 110/70 | HR 84 | Ht 59.0 in | Wt 224.5 lb

## 2021-01-29 DIAGNOSIS — I259 Chronic ischemic heart disease, unspecified: Secondary | ICD-10-CM | POA: Diagnosis not present

## 2021-01-29 DIAGNOSIS — E785 Hyperlipidemia, unspecified: Secondary | ICD-10-CM | POA: Diagnosis not present

## 2021-01-29 DIAGNOSIS — I272 Pulmonary hypertension, unspecified: Secondary | ICD-10-CM | POA: Diagnosis not present

## 2021-01-29 DIAGNOSIS — I10 Essential (primary) hypertension: Secondary | ICD-10-CM | POA: Diagnosis not present

## 2021-01-29 MED ORDER — LOSARTAN POTASSIUM 25 MG PO TABS: 25.0000 mg | ORAL_TABLET | Freq: Every day | ORAL | 3 refills | Status: DC

## 2021-01-29 MED ORDER — BENZONATATE 100 MG PO CAPS: 100.0000 mg | ORAL_CAPSULE | Freq: Three times a day (TID) | ORAL | 0 refills | Status: DC | PRN

## 2021-01-29 NOTE — Progress Notes (Signed)
Cardiology Office Note   Date:  01/29/2021   ID:  Amirrah, Quigley 07-06-1946, MRN [id number]  PCP:  Casilda Carls, MD  Cardiologist:   Kathlyn Sacramento, MD   Chief Complaint  Patient presents with  . Other    F/u echo. Meds reviewed verbally with pt.      History of Present Illness: Courtney Roth is a 75 y.o. female who is here today for follow-up visit regarding regarding pulmonary hypertension, abnormal nuclear stress test and possible underlying ischemic heart disease.  She has chronic medical conditions that include hypertension, chronic back pain, morbid obesity, hyperlipidemia, stage III chronic kidney disease, essential hypertension ulcerative colitis and GERD. She is a previous smoker and quit about 20 years ago. She had an injury to her back in 2006 .  She had shortness of breath and atypical chest pain in 2019. She underwent a treadmill nuclear stress test in 2019 with Dr.Jadali.  She was able to exercise for only 2 minutes and 13 seconds.  She achieved 85% maximal predicted heart rate.  There was moderate apical and anteroseptal defect which was reversible and suggestive of ischemia.  The patient declined cardiac catheterization and has been relatively stable since then.  She is being treated for presumed underlying coronary artery disease. Echocardiogram in 2019 showed an EF of 50 to 55% with grade 1 diastolic dysfunction and mild mitral regurgitation.  Pulmonary pressure could not be estimated.  She had sinus tachycardia that improved with addition of metoprolol.  She was sick in January with Covid-like symptoms. She was not tested but continued to have symptoms of cough and shortness of breath for about 6 weeks. Since then, her breathing did not go back to baseline. She is not vaccinated for COVID-19. She was referred for CT scan of the lungs which showed multiple areas of focal densities consistent with resolving infectious/inflammatory process. In addition,  she was found to have coronary artery and aortic calcifications. She was seen by pulmonary and had COVID-19 antibodies tested which came back positive.  I ordered an echocardiogram during last visit which showed an EF of 50 to 09%, grade 1 diastolic dysfunction, severe pulmonary hypertension with estimated pulmonary systolic pressure of 67 mmHg, mild to moderate tricuspid regurgitation and normal size IVC.  She reports some improvement in shortness of breath but continues to have significant dry cough.  No chest pain.  Past Medical History:  Diagnosis Date  . Colon polyps 03/04/2007   History of Hyperplastic  . Dyspnea   . GERD (gastroesophageal reflux disease)   . Hyperlipidemia   . Hypertension   . Obesity   . Pneumonia   . Stress reaction   . Ulcerative colitis     Past Surgical History:  Procedure Laterality Date  . COLONOSCOPY WITH PROPOFOL N/A 11/19/2018   Procedure: COLONOSCOPY WITH PROPOFOL;  Surgeon: Virgel Manifold, MD;  Location: [facility] ENDOSCOPY;  Service: Endoscopy;  Laterality: N/A;  . ruptured tubal pregnancy  1975   1 ovary removed     Current Outpatient Medications  Medication Sig Dispense Refill  . acetaminophen (TYLENOL) 500 MG tablet Take 1,000 mg by mouth every 6 (six) hours as needed for mild pain (patient takes 1 - 2 depending on activity level).    Marland Kitchen aspirin 81 MG tablet Take 81 mg by mouth daily.    . Cholecalciferol (VITAMIN D3) 1000 UNITS CAPS Take 1 capsule by mouth daily.    . hydrochlorothiazide (HYDRODIURIL) 25 MG tablet Take 25  mg by mouth daily.    Marland Kitchen levothyroxine (SYNTHROID) 50 MCG tablet Take 50 mcg by mouth daily before breakfast.    . losartan (COZAAR) 25 MG tablet Take 1 tablet (25 mg total) by mouth daily. 90 tablet 3  . metoprolol tartrate (LOPRESSOR) 25 MG tablet TAKE 1 TABLET BY MOUTH TWICE A DAY 180 tablet 3  . rosuvastatin (CRESTOR) 20 MG tablet Take 1 tablet (20 mg total) by mouth daily. 90 tablet 3  . sulfaSALAzine (AZULFIDINE)  500 MG tablet Take 2 tablets (1,000 mg total) by mouth 2 (two) times daily. 120 tablet 3  . benzonatate (TESSALON) 100 MG capsule Take 1 capsule (100 mg total) by mouth 3 (three) times daily as needed for cough. 30 capsule 0   No current facility-administered medications for this visit.    Allergies:   Codeine, Metoprolol, Metoprolol succinate, and Other    Social History:  The patient  reports that she quit smoking about 14 years ago. Her smoking use included cigarettes. She has never used smokeless tobacco. She reports current alcohol use. She reports that she does not use drugs.   Family History:  The patient's family history includes Breast cancer in her cousin and maternal aunt; Clotting disorder in her mother; Crohn's disease in her sister; Diabetes in her paternal uncle; GER disease in her mother; Heart disease in her father and mother; Hypertension in her mother; Osteoporosis in her mother.    ROS:  Please see the history of present illness.   Otherwise, review of systems are positive for none.   All other systems are reviewed and negative.    PHYSICAL EXAM: VS:  BP 110/70 (BP Location: Right Arm, Patient Position: Sitting, Cuff Size: Large)   Pulse 84   Ht 4' 11"  (1.499 m)   Wt 224 lb 8 oz (101.8 kg)   SpO2 94%   BMI 45.34 kg/m  , BMI Body mass index is 45.34 kg/m. GEN: Well nourished, well developed, in no acute distress  HEENT: normal  Neck: no JVD, carotid bruits, or masses Cardiac: RRR; no murmurs, rubs, or gallops,no edema  Respiratory:  clear to auscultation bilaterally, normal work of breathing GI: soft, nontender, nondistended, + BS MS: no deformity or atrophy  Skin: warm and dry, no rash Neuro:  Strength and sensation are intact Psych: euthymic mood, full affect   EKG:  EKG is not ordered today.    Recent Labs: No results found for requested labs within last 8760 hours.    Lipid Panel    Component Value Date/Time   CHOL 197 10/18/2019 1515   TRIG  188 (H) 10/18/2019 1515   HDL 45 10/18/2019 1515   CHOLHDL 4.4 10/18/2019 1515   VLDL 38 10/18/2019 1515   LDLCALC 114 (H) 10/18/2019 1515   LDLDIRECT 120.4 (H) 10/18/2019 1515      Wt Readings from Last 3 Encounters:  01/29/21 224 lb 8 oz (101.8 kg)  12/27/20 227 lb (103 kg)  08/09/20 240 lb 2 oz (108.9 kg)       PAD Screen 06/22/2018  Previous PAD dx? No  Previous surgical procedure? No  Pain with walking? No  Feet/toe relief with dangling? No  Painful, non-healing ulcers? No  Extremities discolored? No      ASSESSMENT AND PLAN:  1.  chronic ischemic heart disease with abnormal nuclear stress test in the past: In addition, recent CT of the lungs showed evidence of coronary atherosclerosis. Continuing medical therapy with aspirin, metoprolol and a statin.  Her symptoms include significant exertional dyspnea which has worsened significantly after recent Covid infection.  I do think she benefit from left heart catheterization and this was discussed with her today but she wants to postpone any procedures until her fianc comes back from [place] in June.  2.  Severe pulmonary hypertension: This is a new diagnosis based on recent echocardiogram.  She does not appear to be volume overloaded by physical exam and thus I am hesitant to add any diuretics especially with underlying mild chronic kidney disease.  I think the best option is to proceed with a right heart catheterization and I discussed this with her.  She is agreeable but wants to wait a few months.  3  Essential hypertension: Blood pressure is controlled on current medications.  Although she has been on lisinopril for a while, I worry that it might be contributing to her dry cough.  I elected to change this medication to losartan 25 mg once daily.  I also agreed to give her a refill on Tessalon capsules for cough.  4.   Hyperlipidemia: Continue rosuvastatin with a target LDL of less than 70.    Disposition:   FU with me  in 3 months  Signed,  Kathlyn Sacramento, MD  01/29/2021 5:41 PM    [place]

## 2021-01-29 NOTE — Patient Instructions (Signed)
Medication Instructions:  Your physician has recommended you make the following change in your medication:   1) STOP Lisinopril  2) START Losartan 25 mg daily  Tessalon has been refilled today  *If you need a refill on your cardiac medications before your next appointment, please call your pharmacy*   Lab Work: None ordered If you have labs (blood work) drawn today and your tests are completely normal, you will receive your results only by: Marland Kitchen MyChart Message (if you have MyChart) OR . A paper copy in the mail If you have any lab test that is abnormal or we need to change your treatment, we will call you to review the results.   Testing/Procedures: None ordered   Follow-Up: At [facility], you and your health needs are our priority.  As part of our continuing mission to provide you with exceptional heart care, we have created designated Provider Care Teams.  These Care Teams include your primary Cardiologist (physician) and Advanced Practice Providers (APPs -  Physician Assistants and Nurse Practitioners) who all work together to provide you with the care you need, when you need it.  We recommend signing up for the patient portal called "MyChart".  Sign up information is provided on this After Visit Summary.  MyChart is used to connect with patients for Virtual Visits (Telemedicine).  Patients are able to view lab/test results, encounter notes, upcoming appointments, etc.  Non-urgent messages can be sent to your provider as well.   To learn more about what you can do with MyChart, go to [email or web address].    Your next appointment:   3 month(s)  The format for your next appointment:   In Person  Provider:   You may see Kathlyn Sacramento, MD or one of the following Advanced Practice Providers on your designated Care Team:    Murray Hodgkins, NP  Christell Faith, PA-C  Marrianne Mood, PA-C  Cadence [place], [place]  Laurann Montana, NP    Other Instructions N/A

## 2021-02-12 ENCOUNTER — Encounter: Payer: Medicare Other | Attending: Pulmonary Disease

## 2021-02-12 ENCOUNTER — Other Ambulatory Visit: Payer: Self-pay

## 2021-02-12 DIAGNOSIS — R0600 Dyspnea, unspecified: Secondary | ICD-10-CM | POA: Insufficient documentation

## 2021-02-12 DIAGNOSIS — J8410 Pulmonary fibrosis, unspecified: Secondary | ICD-10-CM | POA: Insufficient documentation

## 2021-02-12 DIAGNOSIS — R0609 Other forms of dyspnea: Secondary | ICD-10-CM

## 2021-02-12 NOTE — Progress Notes (Signed)
Virtual Visit completed. Patient informed on EP and RD appointment and 6 Minute walk test. Patient also informed of patient health questionnaires on My Chart. Patient Verbalizes understanding. Visit diagnosis can be found in [facility] 01/24/2021.

## 2021-02-20 ENCOUNTER — Encounter: Payer: Medicare Other | Admitting: *Deleted

## 2021-02-20 ENCOUNTER — Other Ambulatory Visit: Payer: Self-pay

## 2021-02-20 VITALS — Ht 60.2 in | Wt 226.1 lb

## 2021-02-20 DIAGNOSIS — R0600 Dyspnea, unspecified: Secondary | ICD-10-CM

## 2021-02-20 DIAGNOSIS — R0609 Other forms of dyspnea: Secondary | ICD-10-CM

## 2021-02-20 DIAGNOSIS — J8410 Pulmonary fibrosis, unspecified: Secondary | ICD-10-CM | POA: Diagnosis present

## 2021-02-20 NOTE — Progress Notes (Signed)
Pulmonary Individual Treatment Plan  Patient Details  Name: Courtney Roth MRN: [id number] Date of Birth: 01-02-1946 Referring Provider:   Flowsheet Row Pulmonary Rehab from 02/20/2021 in [facility] Cardiac and Pulmonary Rehab  Referring Provider Zetta Bills MD      Initial Encounter Date:  Flowsheet Row Pulmonary Rehab from 02/20/2021 in [facility] Cardiac and Pulmonary Rehab  Date 02/20/21      Visit Diagnosis: Pulmonary fibrosis ([place])  DOE (dyspnea on exertion)  Patient's Home Medications on Admission:  Current Outpatient Medications:  .  acetaminophen (TYLENOL) 500 MG tablet, Take 1,000 mg by mouth every 6 (six) hours as needed for mild pain (patient takes 1 - 2 depending on activity level)., Disp: , Rfl:  .  aspirin 81 MG tablet, Take 81 mg by mouth daily., Disp: , Rfl:  .  benzonatate (TESSALON) 100 MG capsule, Take 1 capsule (100 mg total) by mouth 3 (three) times daily as needed for cough. (Patient not taking: Reported on 02/12/2021), Disp: 30 capsule, Rfl: 0 .  Cholecalciferol (VITAMIN D3) 1000 UNITS CAPS, Take 1 capsule by mouth daily., Disp: , Rfl:  .  hydrochlorothiazide (HYDRODIURIL) 25 MG tablet, Take 25 mg by mouth daily., Disp: , Rfl:  .  levothyroxine (SYNTHROID) 50 MCG tablet, Take 50 mcg by mouth daily before breakfast., Disp: , Rfl:  .  losartan (COZAAR) 25 MG tablet, Take 1 tablet (25 mg total) by mouth daily., Disp: 90 tablet, Rfl: 3 .  metoprolol tartrate (LOPRESSOR) 25 MG tablet, TAKE 1 TABLET BY MOUTH TWICE A DAY (Patient not taking: Reported on 02/12/2021), Disp: 180 tablet, Rfl: 3 .  rosuvastatin (CRESTOR) 20 MG tablet, Take 1 tablet (20 mg total) by mouth daily., Disp: 90 tablet, Rfl: 3 .  sulfaSALAzine (AZULFIDINE) 500 MG tablet, Take 2 tablets (1,000 mg total) by mouth 2 (two) times daily. (Patient not taking: Reported on 02/12/2021), Disp: 120 tablet, Rfl: 3 .  traZODone (DESYREL) 50 MG tablet, , Disp: , Rfl:   Past Medical History: Past Medical History:   Diagnosis Date  . Colon polyps 03/04/2007   History of Hyperplastic  . Dyspnea   . GERD (gastroesophageal reflux disease)   . Hyperlipidemia   . Hypertension   . Obesity   . Pneumonia   . Stress reaction   . Ulcerative colitis     Tobacco Use: Social History   Tobacco Use  Smoking Status Former Smoker  . Packs/day: 1.00  . Years: 30.00  . Pack years: 30.00  . Types: Cigarettes  . Quit date: 10/27/2006  . Years since quitting: 14.3  Smokeless Tobacco Never Used    Labs: Recent Chemical engineer    Labs for ITP Cardiac and Pulmonary Rehab Latest Ref Rng & Units 07/08/2011 11/30/2013 01/17/2014 03/19/2015 10/18/2019   Cholestrol 0 - 200 mg/dL - 208(H) 192 195 197   LDLCALC 0 - 99 mg/dL - - 115(H) 121(H) 114(H)   LDLDIRECT 0 - 99 mg/dL - 155.2 - - 120.4(H)   HDL >40 mg/dL - 43.70 42.90 46.90 45   Trlycerides <150 mg/dL - 124.0 171.0(H) 137.0 188(H)   Hemoglobin A1c 4.6 - 6.5 % 5.9 5.9 - 5.6 -       Pulmonary Assessment Scores:  Pulmonary Assessment Scores    Row Name 02/20/21 1516         ADL UCSD   ADL Phase Entry     SOB Score total 57     Rest 0     Walk 0  Stairs 5     Bath 4     Dress 3     Shop 1           CAT Score   CAT Score 20           mMRC Score   mMRC Score 2            UCSD: Self-administered rating of dyspnea associated with activities of daily living (ADLs) 6-point scale (0 = "not at all" to 5 = "maximal or unable to do because of breathlessness")  Scoring Scores range from 0 to 120.  Minimally important difference is 5 units  CAT: CAT can identify the health impairment of COPD patients and is better correlated with disease progression.  CAT has a scoring range of zero to 40. The CAT score is classified into four groups of low (less than 10), medium (10 - 20), high (21-30) and very high (31-40) based on the impact level of disease on health status. A CAT score over 10 suggests significant symptoms.  A worsening CAT score could be  explained by an exacerbation, poor medication adherence, poor inhaler technique, or progression of COPD or comorbid conditions.  CAT MCID is 2 points  mMRC: mMRC (Modified Medical Research Council) Dyspnea Scale is used to assess the degree of baseline functional disability in patients of respiratory disease due to dyspnea. No minimal important difference is established. A decrease in score of 1 point or greater is considered a positive change.   Pulmonary Function Assessment:  Pulmonary Function Assessment - 02/12/21 0838      Breath   Shortness of Breath Yes;Limiting activity           Exercise Target Goals: Exercise Program Goal: Individual exercise prescription set using results from initial 6 min walk test and THRR while considering  patient's activity barriers and safety.   Exercise Prescription Goal: Initial exercise prescription builds to 30-45 minutes a day of aerobic activity, 2-3 days per week.  Home exercise guidelines will be given to patient during program as part of exercise prescription that the participant will acknowledge.  Education: Aerobic Exercise: - Group verbal and visual presentation on the components of exercise prescription. Introduces F.I.T.T principle from ACSM for exercise prescriptions.  Reviews F.I.T.T. principles of aerobic exercise including progression. Written material given at graduation.   Education: Resistance Exercise: - Group verbal and visual presentation on the components of exercise prescription. Introduces F.I.T.T principle from ACSM for exercise prescriptions  Reviews F.I.T.T. principles of resistance exercise including progression. Written material given at graduation.    Education: Exercise & Equipment Safety: - Individual verbal instruction and demonstration of equipment use and safety with use of the equipment. Flowsheet Row Pulmonary Rehab from 02/20/2021 in [facility] Cardiac and Pulmonary Rehab  Date 02/12/21  Educator [facility]  Instruction  Review Code 1- Verbalizes Understanding      Education: Exercise Physiology & General Exercise Guidelines: - Group verbal and written instruction with models to review the exercise physiology of the cardiovascular system and associated critical values. Provides general exercise guidelines with specific guidelines to those with heart or lung disease.    Education: Flexibility, Balance, Mind/Body Relaxation: - Group verbal and visual presentation with interactive activity on the components of exercise prescription. Introduces F.I.T.T principle from ACSM for exercise prescriptions. Reviews F.I.T.T. principles of flexibility and balance exercise training including progression. Also discusses the mind body connection.  Reviews various relaxation techniques to help reduce and manage stress (i.e. Deep breathing, progressive muscle  relaxation, and visualization). Balance handout provided to take home. Written material given at graduation.   Activity Barriers & Risk Stratification:  Activity Barriers & Cardiac Risk Stratification - 02/20/21 1507      Activity Barriers & Cardiac Risk Stratification   Activity Barriers Arthritis;Deconditioning;Muscular Weakness;Balance Concerns;Assistive Device;Shortness of Breath;Back Problems;Joint Problems   chronic knee pain from arthritis, uses walker for support routinely          6 Minute Walk:  6 Minute Walk    Row Name 02/20/21 1506         6 Minute Walk   Phase Initial     Distance 535 feet     Walk Time 6 minutes     # of Rest Breaks 0     MPH 1.01     METS 0.67     RPE 12     Perceived Dyspnea  2     VO2 Peak 2.34     Symptoms Yes (comment)     Comments chronic knee pain 5/10 (used rollator for support and encouraged to bring her walker each day)     Resting HR 56 bpm     Resting BP 146/84     Resting Oxygen Saturation  93 %     Exercise Oxygen Saturation  during 6 min walk 82 %     Max Ex. HR 103 bpm     Max Ex. BP 164/78     2 Minute  Post BP 142/70           Interval HR   1 Minute HR 87     2 Minute HR 96     3 Minute HR 99     4 Minute HR 100     5 Minute HR 101     6 Minute HR 103     2 Minute Post HR 78     Interval Heart Rate? Yes           Interval Oxygen   Interval Oxygen? Yes     Baseline Oxygen Saturation % 93 %     1 Minute Oxygen Saturation % 85 %     1 Minute Liters of Oxygen 0 L  Room Air     2 Minute Oxygen Saturation % 84 %     2 Minute Liters of Oxygen 0 L     3 Minute Oxygen Saturation % 84 %     3 Minute Liters of Oxygen 0 L     4 Minute Oxygen Saturation % 84 %     4 Minute Liters of Oxygen 0 L     5 Minute Oxygen Saturation % 82 %     5 Minute Liters of Oxygen 0 L     6 Minute Oxygen Saturation % 83 %     6 Minute Liters of Oxygen 0 L     2 Minute Post Oxygen Saturation % 92 %     2 Minute Post Liters of Oxygen 0 L           Oxygen Initial Assessment:  Oxygen Initial Assessment - 02/12/21 0838      Home Oxygen   Home Oxygen Device None    Sleep Oxygen Prescription None    Home Exercise Oxygen Prescription None    Home Resting Oxygen Prescription None      Initial 6 min Walk   Oxygen Used None      Program Oxygen Prescription   Program Oxygen Prescription None  Intervention   Short Term Goals To learn and understand importance of monitoring SPO2 with pulse oximeter and demonstrate accurate use of the pulse oximeter.;To learn and understand importance of maintaining oxygen saturations>88%;To learn and demonstrate proper pursed lip breathing techniques or other breathing techniques.;To learn and demonstrate proper use of respiratory medications    Long  Term Goals Verbalizes importance of monitoring SPO2 with pulse oximeter and return demonstration;Maintenance of O2 saturations>88%;Exhibits proper breathing techniques, such as pursed lip breathing or other method taught during program session;Compliance with respiratory medication;Demonstrates proper use of MDI's            Oxygen Re-Evaluation:   Oxygen Discharge (Final Oxygen Re-Evaluation):   Initial Exercise Prescription:  Initial Exercise Prescription - 02/20/21 1500      Date of Initial Exercise RX and Referring Provider   Date 02/20/21    Referring Provider Zetta Bills MD      NuStep   Level 1    SPM 80    Minutes 15    METs 1.5      Arm Ergometer   Level 1    Watts 10    RPM 25    Minutes 15    METs 1.5      Track   Laps 10    Minutes 15    METs 1.5      Prescription Details   Frequency (times per week) 3    Duration Progress to 30 minutes of continuous aerobic without signs/symptoms of physical distress      Intensity   THRR 40-80% of Max Heartrate 92-127    Ratings of Perceived Exertion 11-13    Perceived Dyspnea 0-4      Progression   Progression Continue to progress workloads to maintain intensity without signs/symptoms of physical distress.      Resistance Training   Training Prescription Yes    Weight 3 lb    Reps 10-15           Perform Capillary Blood Glucose checks as needed.  Exercise Prescription Changes:  Exercise Prescription Changes    Row Name 02/20/21 1500             Response to Exercise   Blood Pressure (Admit) 146/84       Blood Pressure (Exercise) 164/78       Blood Pressure (Exit) 136/64       Heart Rate (Admit) 56 bpm       Heart Rate (Exercise) 103 bpm       Heart Rate (Exit) 73 bpm       Oxygen Saturation (Admit) 93 %       Oxygen Saturation (Exercise) 82 %       Oxygen Saturation (Exit) 91 %       Rating of Perceived Exertion (Exercise) 12       Perceived Dyspnea (Exercise) 2       Symptoms knee pain 5/10       Comments walk test results              Exercise Comments:   Exercise Goals and Review:  Exercise Goals    Row Name 02/20/21 1510             Exercise Goals   Increase Physical Activity Yes       Intervention Provide advice, education, support and counseling about physical activity/exercise  needs.;Develop an individualized exercise prescription for aerobic and resistive training based on initial evaluation findings, risk stratification, comorbidities and participant's  personal goals.       Expected Outcomes Short Term: Attend rehab on a regular basis to increase amount of physical activity.;Long Term: Add in home exercise to make exercise part of routine and to increase amount of physical activity.;Long Term: Exercising regularly at least 3-5 days a week.       Increase Strength and Stamina Yes       Intervention Provide advice, education, support and counseling about physical activity/exercise needs.;Develop an individualized exercise prescription for aerobic and resistive training based on initial evaluation findings, risk stratification, comorbidities and participant's personal goals.       Expected Outcomes Short Term: Increase workloads from initial exercise prescription for resistance, speed, and METs.;Short Term: Perform resistance training exercises routinely during rehab and add in resistance training at home;Long Term: Improve cardiorespiratory fitness, muscular endurance and strength as measured by increased METs and functional capacity (6MWT)       Able to understand and use rate of perceived exertion (RPE) scale Yes       Intervention Provide education and explanation on how to use RPE scale       Expected Outcomes Short Term: Able to use RPE daily in rehab to express subjective intensity level;Long Term:  Able to use RPE to guide intensity level when exercising independently       Able to understand and use Dyspnea scale Yes       Intervention Provide education and explanation on how to use Dyspnea scale       Expected Outcomes Long Term: Able to use Dyspnea scale to guide intensity level when exercising independently;Short Term: Able to use Dyspnea scale daily in rehab to express subjective sense of shortness of breath during exertion       Knowledge and understanding of  Target Heart Rate Range (THRR) Yes       Intervention Provide education and explanation of THRR including how the numbers were predicted and where they are located for reference       Expected Outcomes Short Term: Able to state/look up THRR;Short Term: Able to use daily as guideline for intensity in rehab;Long Term: Able to use THRR to govern intensity when exercising independently       Able to check pulse independently Yes       Intervention Provide education and demonstration on how to check pulse in carotid and radial arteries.;Review the importance of being able to check your own pulse for safety during independent exercise       Expected Outcomes Short Term: Able to explain why pulse checking is important during independent exercise;Long Term: Able to check pulse independently and accurately       Understanding of Exercise Prescription Yes       Intervention Provide education, explanation, and written materials on patient's individual exercise prescription       Expected Outcomes Long Term: Able to explain home exercise prescription to exercise independently;Short Term: Able to explain program exercise prescription              Exercise Goals Re-Evaluation :   Discharge Exercise Prescription (Final Exercise Prescription Changes):  Exercise Prescription Changes - 02/20/21 1500      Response to Exercise   Blood Pressure (Admit) 146/84    Blood Pressure (Exercise) 164/78    Blood Pressure (Exit) 136/64    Heart Rate (Admit) 56 bpm    Heart Rate (Exercise) 103 bpm    Heart Rate (Exit) 73 bpm    Oxygen Saturation (Admit) 93 %  Oxygen Saturation (Exercise) 82 %    Oxygen Saturation (Exit) 91 %    Rating of Perceived Exertion (Exercise) 12    Perceived Dyspnea (Exercise) 2    Symptoms knee pain 5/10    Comments walk test results           Nutrition:  Target Goals: Understanding of nutrition guidelines, daily intake of sodium <1578m, cholesterol <2070m calories 30% from fat  and 7% or less from saturated fats, daily to have 5 or more servings of fruits and vegetables.  Education: All About Nutrition: -Group instruction provided by verbal, written material, interactive activities, discussions, models, and posters to present general guidelines for heart healthy nutrition including fat, fiber, MyPlate, the role of sodium in heart healthy nutrition, utilization of the nutrition label, and utilization of this knowledge for meal planning. Follow up email sent as well. Written material given at graduation.   Biometrics:  Pre Biometrics - 02/20/21 1512      Pre Biometrics   Height 5' 0.2" (1.529 m)    Weight 226 lb 1.6 oz (102.6 kg)    BMI (Calculated) 43.87    Single Leg Stand 0 seconds   unable to balance           Nutrition Therapy Plan and Nutrition Goals:  Nutrition Therapy & Goals - 02/20/21 1514      Personal Nutrition Goals   Comments Made appt with dietitian      Intervention Plan   Intervention Prescribe, educate and counsel regarding individualized specific dietary modifications aiming towards targeted core components such as weight, hypertension, lipid management, diabetes, heart failure and other comorbidities.    Expected Outcomes Short Term Goal: Understand basic principles of dietary content, such as calories, fat, sodium, cholesterol and nutrients.;Short Term Goal: A plan has been developed with personal nutrition goals set during dietitian appointment.;Long Term Goal: Adherence to prescribed nutrition plan.           Nutrition Assessments:  MEDIFICTS Score Key:  ?70 Need to make dietary changes   40-70 Heart Healthy Diet  ? 40 Therapeutic Level Cholesterol Diet  Flowsheet Row [facility]  Picture Your Plate Total Score on Admission 66     Picture Your Plate Scores:  <4<81nhealthy dietary pattern with much room for improvement.  41-50 Dietary pattern unlikely to meet  recommendations for good health and room for improvement.  51-60 More healthful dietary pattern, with some room for improvement.   >60 Healthy dietary pattern, although there may be some specific behaviors that could be improved.   Nutrition Goals Re-Evaluation:   Nutrition Goals Discharge (Final Nutrition Goals Re-Evaluation):   Psychosocial: Target Goals: Acknowledge presence or absence of significant depression and/or stress, maximize coping skills, provide positive support system. Participant is able to verbalize types and ability to use techniques and skills needed for reducing stress and depression.   Education: Stress, Anxiety, and Depression - Group verbal and visual presentation to define topics covered.  Reviews how body is impacted by stress, anxiety, and depression.  Also discusses healthy ways to reduce stress and to treat/manage anxiety and depression.  Written material given at graduation.   Education: Sleep Hygiene -Provides group verbal and written instruction about how sleep can affect your health.  Define sleep hygiene, discuss sleep cycles and impact of sleep habits. Review good sleep hygiene tips.    Initial Review & Psychosocial Screening:  Initial Psych Review & Screening - 02/12/21 [id number]  Initial Review   Current issues with None Identified      Family Dynamics   Good Support System? Yes    Comments She can look to her son and fiance for support. She is willing to make changes to her health.      Barriers   Psychosocial barriers to participate in program The patient should benefit from training in stress management and relaxation.      Screening Interventions   Interventions Encouraged to exercise;Provide feedback about the scores to participant;To provide support and resources with identified psychosocial needs    Expected Outcomes Long Term Goal: Stressors or current issues are controlled or eliminated.;Short Term goal: Utilizing psychosocial  counselor, staff and physician to assist with identification of specific Stressors or current issues interfering with healing process. Setting desired goal for each stressor or current issue identified.;Short Term goal: Identification and review with participant of any Quality of Life or Depression concerns found by scoring the questionnaire.;Long Term goal: The participant improves quality of Life and PHQ9 Scores as seen by post scores and/or verbalization of changes           Quality of Life Scores:  Scores of 19 and below usually indicate a poorer quality of life in these areas.  A difference of  2-3 points is a clinically meaningful difference.  A difference of 2-3 points in the total score of the Quality of Life Index has been associated with significant improvement in overall quality of life, self-image, physical symptoms, and general health in studies assessing change in quality of life.  PHQ-9: Recent Review Flowsheet Data    Depression screen [facility] 2/9 02/20/2021 01/11/2018   Decreased Interest 0 0   Down, Depressed, Hopeless 0 0   PHQ - 2 Score 0 0   Altered sleeping 2 -   Tired, decreased energy 2 -   Change in appetite 0 -   Feeling bad or failure about yourself  0 -   Trouble concentrating 0 -   Moving slowly or fidgety/restless 0 -   Suicidal thoughts 0 -   PHQ-9 Score 4 -   Difficult doing work/chores Not difficult at all -     Interpretation of Total Score  Total Score Depression Severity:  1-4 = Minimal depression, 5-9 = Mild depression, 10-14 = Moderate depression, 15-19 = Moderately severe depression, 20-27 = Severe depression   Psychosocial Evaluation and Intervention:  Psychosocial Evaluation - 02/12/21 0843      Psychosocial Evaluation & Interventions   Interventions Encouraged to exercise with the program and follow exercise prescription;Relaxation education;Stress management education    Comments She can look to her son and fiance for support. She is willing to  make changes to her health.    Expected Outcomes Short: Exercise regularly to support mental health and notify staff of any changes. Long: maintain mental health and well being through teaching of rehab or prescribed medications independently.    Continue Psychosocial Services  Follow up required by staff           Psychosocial Re-Evaluation:   Psychosocial Discharge (Final Psychosocial Re-Evaluation):   Education: Education Goals: Education classes will be provided on a weekly basis, covering required topics. Participant will state understanding/return demonstration of topics presented.  Learning Barriers/Preferences:  Learning Barriers/Preferences - 02/12/21 5361      Learning Barriers/Preferences   Learning Barriers None    Learning Preferences None           General Pulmonary Education Topics:  Infection  Prevention: - Provides verbal and written material to individual with discussion of infection control including proper hand washing and proper equipment cleaning during exercise session. Flowsheet Row Pulmonary Rehab from 02/20/2021 in [facility] Cardiac and Pulmonary Rehab  Date 02/12/21  Educator [facility]  Instruction Review Code 1- Verbalizes Understanding      Falls Prevention: - Provides verbal and written material to individual with discussion of falls prevention and safety. Flowsheet Row Pulmonary Rehab from 02/20/2021 in [facility] Cardiac and Pulmonary Rehab  Date 02/12/21  Educator [facility]  Instruction Review Code 1- Verbalizes Understanding      Chronic Lung Disease Review: - Group verbal instruction with posters, models, PowerPoint presentations and videos,  to review new updates, new respiratory medications, new advancements in procedures and treatments. Providing information on websites and "800" numbers for continued self-education. Includes information about supplement oxygen, available portable oxygen systems, continuous and intermittent flow rates, oxygen safety,  concentrators, and Medicare reimbursement for oxygen. Explanation of Pulmonary Drugs, including class, frequency, complications, importance of spacers, rinsing mouth after steroid MDI's, and proper cleaning methods for nebulizers. Review of basic lung anatomy and physiology related to function, structure, and complications of lung disease. Review of risk factors. Discussion about methods for diagnosing sleep apnea and types of masks and machines for OSA. Includes a review of the use of types of environmental controls: home humidity, furnaces, filters, dust mite/pet prevention, HEPA vacuums. Discussion about weather changes, air quality and the benefits of nasal washing. Instruction on Warning signs, infection symptoms, calling MD promptly, preventive modes, and value of vaccinations. Review of effective airway clearance, coughing and/or vibration techniques. Emphasizing that all should Create an Action Plan. Written material given at graduation. Flowsheet Row Pulmonary Rehab from 02/20/2021 in [facility]  Education need identified 02/20/21      AED/CPR: - Group verbal and written instruction with the use of models to demonstrate the basic use of the AED with the basic ABC's of resuscitation.    Anatomy and Cardiac Procedures: - Group verbal and visual presentation and models provide information about basic cardiac anatomy and function. Reviews the testing methods done to diagnose heart disease and the outcomes of the test results. Describes the treatment choices: Medical Management, Angioplasty, or Coronary Bypass Surgery for treating various heart conditions including Myocardial Infarction, Angina, Valve Disease, and Cardiac Arrhythmias.  Written material given at graduation.   Medication Safety: - Group verbal and visual instruction to review commonly prescribed medications for heart and lung disease. Reviews the medication, class of the drug, and side effects. Includes the  steps to properly store meds and maintain the prescription regimen.  Written material given at graduation.   Other: -Provides group and verbal instruction on various topics (see comments)   Knowledge Questionnaire Score:  Knowledge Questionnaire Score - 02/20/21 1515      Knowledge Questionnaire Score   Pre Score 15/18 Education Focus: O2 safety            Core Components/Risk Factors/Patient Goals at Admission:  Personal Goals and Risk Factors at Admission - 02/20/21 1515      Core Components/Risk Factors/Patient Goals on Admission    Weight Management Yes;Weight Loss;Obesity    Intervention Weight Management: Develop a combined nutrition and exercise program designed to reach desired caloric intake, while maintaining appropriate intake of nutrient and fiber, sodium and fats, and appropriate energy expenditure required for the weight goal.;Weight Management: Provide education and appropriate resources to help participant work on and attain dietary goals.;Weight Management/Obesity:  Establish reasonable short term and long term weight goals.;Obesity: Provide education and appropriate resources to help participant work on and attain dietary goals.    Admit Weight 226 lb 1.6 oz (102.6 kg)    Goal Weight: Short Term 220 lb (99.8 kg)    Goal Weight: Long Term 215 lb (97.5 kg)    Expected Outcomes Long Term: Adherence to nutrition and physical activity/exercise program aimed toward attainment of established weight goal;Short Term: Continue to assess and modify interventions until short term weight is achieved;Weight Loss: Understanding of general recommendations for a balanced deficit meal plan, which promotes 1-2 lb weight loss per week and includes a negative energy balance of [phone] kcal/d;Understanding recommendations for meals to include 15-35% energy as protein, 25-35% energy from fat, 35-60% energy from carbohydrates, less than 256m of dietary cholesterol, 20-35 gm of total fiber  daily;Understanding of distribution of calorie intake throughout the day with the consumption of 4-5 meals/snacks    Improve shortness of breath with ADL's Yes    Intervention Provide education, individualized exercise plan and daily activity instruction to help decrease symptoms of SOB with activities of daily living.    Expected Outcomes Long Term: Be able to perform more ADLs without symptoms or delay the onset of symptoms;Short Term: Improve cardiorespiratory fitness to achieve a reduction of symptoms when performing ADLs    Hypertension Yes    Intervention Provide education on lifestyle modifcations including regular physical activity/exercise, weight management, moderate sodium restriction and increased consumption of fresh fruit, vegetables, and low fat dairy, alcohol moderation, and smoking cessation.;Monitor prescription use compliance.    Expected Outcomes Short Term: Continued assessment and intervention until BP is < 140/967mHG in hypertensive participants. < 130/8025mG in hypertensive participants with diabetes, heart failure or chronic kidney disease.;Long Term: Maintenance of blood pressure at goal levels.    Lipids Yes    Intervention Provide education and support for participant on nutrition & aerobic/resistive exercise along with prescribed medications to achieve LDL <73m21mDL >40mg29m Expected Outcomes Short Term: Participant states understanding of desired cholesterol values and is compliant with medications prescribed. Participant is following exercise prescription and nutrition guidelines.;Long Term: Cholesterol controlled with medications as prescribed, with individualized exercise RX and with personalized nutrition plan. Value goals: LDL < 73mg,39m > 40 mg.           Education:Diabetes - Individual verbal and written instruction to review signs/symptoms of diabetes, desired ranges of glucose level fasting, after meals and with exercise. Acknowledge that pre and post  exercise glucose checks will be done for 3 sessions at entry of program.   Know Your Numbers and Heart Failure: - Group verbal and visual instruction to discuss disease risk factors for cardiac and pulmonary disease and treatment options.  Reviews associated critical values for Overweight/Obesity, Hypertension, Cholesterol, and Diabetes.  Discusses basics of heart failure: signs/symptoms and treatments.  Introduces Heart Failure Zone chart for action plan for heart failure.  Written material given at graduation.   Core Components/Risk Factors/Patient Goals Review:    Core Components/Risk Factors/Patient Goals at Discharge (Final Review):    ITP Comments:  ITP Comments    Row Name 02/12/21 0841 02/20/21 1506         ITP Comments Virtual Visit completed. Patient informed on EP and RD appointment and 6 Minute walk test. Patient also informed of patient health questionnaires on My Chart. Patient Verbalizes understanding. Visit diagnosis can be found in [facility]20[facility]. Completed 6MWT and gym orientation.  Initial ITP created and sent for review to Dr. Emily Filbert, Medical Director.             Comments: Initial ITP

## 2021-02-20 NOTE — Patient Instructions (Signed)
Patient Instructions  Patient Details  Name: Courtney Roth MRN: [id number] Date of Birth: [birth year] Referring Provider:  Ottie Glazier, MD  Below are your personal goals for exercise, nutrition, and risk factors. Our goal is to help you stay on track towards obtaining and maintaining these goals. We will be discussing your progress on these goals with you throughout the program.  Initial Exercise Prescription:  Initial Exercise Prescription - 02/20/21 1500      Date of Initial Exercise RX and Referring Provider   Date 02/20/21    Referring Provider Zetta Bills MD      NuStep   Level 1    SPM 80    Minutes 15    METs 1.5      Arm Ergometer   Level 1    Watts 10    RPM 25    Minutes 15    METs 1.5      Track   Laps 10    Minutes 15    METs 1.5      Prescription Details   Frequency (times per week) 3    Duration Progress to 30 minutes of continuous aerobic without signs/symptoms of physical distress      Intensity   THRR 40-80% of Max Heartrate 92-127    Ratings of Perceived Exertion 11-13    Perceived Dyspnea 0-4      Progression   Progression Continue to progress workloads to maintain intensity without signs/symptoms of physical distress.      Resistance Training   Training Prescription Yes    Weight 3 lb    Reps 10-15           Exercise Goals: Frequency: Be able to perform aerobic exercise two to three times per week in program working toward 2-5 days per week of home exercise.  Intensity: Work with a perceived exertion of 11 (fairly light) - 15 (hard) while following your exercise prescription.  We will make changes to your prescription with you as you progress through the program.   Duration: Be able to do 30 to 45 minutes of continuous aerobic exercise in addition to a 5 minute warm-up and a 5 minute cool-down routine.   Nutrition Goals: Your personal nutrition goals will be established when you do your nutrition analysis with the  dietician.  The following are general nutrition guidelines to follow: Cholesterol < 224m/day Sodium < 15037mday Fiber: Women over 50 yrs - 21 grams per day  Personal Goals:  Personal Goals and Risk Factors at Admission - 02/20/21 1515      Core Components/Risk Factors/Patient Goals on Admission    Weight Management Yes;Weight Loss;Obesity    Intervention Weight Management: Develop a combined nutrition and exercise program designed to reach desired caloric intake, while maintaining appropriate intake of nutrient and fiber, sodium and fats, and appropriate energy expenditure required for the weight goal.;Weight Management: Provide education and appropriate resources to help participant work on and attain dietary goals.;Weight Management/Obesity: Establish reasonable short term and long term weight goals.;Obesity: Provide education and appropriate resources to help participant work on and attain dietary goals.    Admit Weight 226 lb 1.6 oz (102.6 kg)    Goal Weight: Short Term 220 lb (99.8 kg)    Goal Weight: Long Term 215 lb (97.5 kg)    Expected Outcomes Long Term: Adherence to nutrition and physical activity/exercise program aimed toward attainment of established weight goal;Short Term: Continue to assess and modify interventions until short term weight is achieved;Weight Loss:  Understanding of general recommendations for a balanced deficit meal plan, which promotes 1-2 lb weight loss per week and includes a negative energy balance of [phone] kcal/d;Understanding recommendations for meals to include 15-35% energy as protein, 25-35% energy from fat, 35-60% energy from carbohydrates, less than 289m of dietary cholesterol, 20-35 gm of total fiber daily;Understanding of distribution of calorie intake throughout the day with the consumption of 4-5 meals/snacks    Improve shortness of breath with ADL's Yes    Intervention Provide education, individualized exercise plan and daily activity instruction  to help decrease symptoms of SOB with activities of daily living.    Expected Outcomes Long Term: Be able to perform more ADLs without symptoms or delay the onset of symptoms;Short Term: Improve cardiorespiratory fitness to achieve a reduction of symptoms when performing ADLs    Hypertension Yes    Intervention Provide education on lifestyle modifcations including regular physical activity/exercise, weight management, moderate sodium restriction and increased consumption of fresh fruit, vegetables, and low fat dairy, alcohol moderation, and smoking cessation.;Monitor prescription use compliance.    Expected Outcomes Short Term: Continued assessment and intervention until BP is < 140/95mHG in hypertensive participants. < 130/8076mG in hypertensive participants with diabetes, heart failure or chronic kidney disease.;Long Term: Maintenance of blood pressure at goal levels.    Lipids Yes    Intervention Provide education and support for participant on nutrition & aerobic/resistive exercise along with prescribed medications to achieve LDL <16m71mDL >40mg71m Expected Outcomes Short Term: Participant states understanding of desired cholesterol values and is compliant with medications prescribed. Participant is following exercise prescription and nutrition guidelines.;Long Term: Cholesterol controlled with medications as prescribed, with individualized exercise RX and with personalized nutrition plan. Value goals: LDL < 16mg,92m > 40 mg.           Tobacco Use Initial Evaluation: Social History   Tobacco Use  Smoking Status Former Smoker  . Packs/day: 1.00  . Years: 30.00  . Pack years: 30.00  . Types: Cigarettes  . Quit date: 10/27/2006  . Years since quitting: 14.3  Smokeless Tobacco Never Used    Exercise Goals and Review:  Exercise Goals    Row Name 02/20/21 1510             Exercise Goals   Increase Physical Activity Yes       Intervention Provide advice, education, support and  counseling about physical activity/exercise needs.;Develop an individualized exercise prescription for aerobic and resistive training based on initial evaluation findings, risk stratification, comorbidities and participant's personal goals.       Expected Outcomes Short Term: Attend rehab on a regular basis to increase amount of physical activity.;Long Term: Add in home exercise to make exercise part of routine and to increase amount of physical activity.;Long Term: Exercising regularly at least 3-5 days a week.       Increase Strength and Stamina Yes       Intervention Provide advice, education, support and counseling about physical activity/exercise needs.;Develop an individualized exercise prescription for aerobic and resistive training based on initial evaluation findings, risk stratification, comorbidities and participant's personal goals.       Expected Outcomes Short Term: Increase workloads from initial exercise prescription for resistance, speed, and METs.;Short Term: Perform resistance training exercises routinely during rehab and add in resistance training at home;Long Term: Improve cardiorespiratory fitness, muscular endurance and strength as measured by increased METs and functional capacity (6MWT)       Able to understand  and use rate of perceived exertion (RPE) scale Yes       Intervention Provide education and explanation on how to use RPE scale       Expected Outcomes Short Term: Able to use RPE daily in rehab to express subjective intensity level;Long Term:  Able to use RPE to guide intensity level when exercising independently       Able to understand and use Dyspnea scale Yes       Intervention Provide education and explanation on how to use Dyspnea scale       Expected Outcomes Long Term: Able to use Dyspnea scale to guide intensity level when exercising independently;Short Term: Able to use Dyspnea scale daily in rehab to express subjective sense of shortness of breath during exertion        Knowledge and understanding of Target Heart Rate Range (THRR) Yes       Intervention Provide education and explanation of THRR including how the numbers were predicted and where they are located for reference       Expected Outcomes Short Term: Able to state/look up THRR;Short Term: Able to use daily as guideline for intensity in rehab;Long Term: Able to use THRR to govern intensity when exercising independently       Able to check pulse independently Yes       Intervention Provide education and demonstration on how to check pulse in carotid and radial arteries.;Review the importance of being able to check your own pulse for safety during independent exercise       Expected Outcomes Short Term: Able to explain why pulse checking is important during independent exercise;Long Term: Able to check pulse independently and accurately       Understanding of Exercise Prescription Yes       Intervention Provide education, explanation, and written materials on patient's individual exercise prescription       Expected Outcomes Long Term: Able to explain home exercise prescription to exercise independently;Short Term: Able to explain program exercise prescription              Copy of goals given to participant.

## 2021-02-25 ENCOUNTER — Encounter: Payer: Medicare Other | Attending: Pulmonary Disease

## 2021-02-25 ENCOUNTER — Other Ambulatory Visit: Payer: Self-pay

## 2021-02-25 DIAGNOSIS — J8410 Pulmonary fibrosis, unspecified: Secondary | ICD-10-CM | POA: Diagnosis present

## 2021-02-25 DIAGNOSIS — R0600 Dyspnea, unspecified: Secondary | ICD-10-CM | POA: Diagnosis present

## 2021-02-25 DIAGNOSIS — R0609 Other forms of dyspnea: Secondary | ICD-10-CM

## 2021-02-25 NOTE — Progress Notes (Signed)
Daily Session Note  Patient Details  Name: Courtney Roth MRN: [id number] Date of Birth: 1946-08-11 Referring Provider:   Flowsheet Row Pulmonary Rehab from 02/20/2021 in [facility]  Referring Provider Zetta Bills MD      Encounter Date: 02/25/2021  Check In:  Session Check In - 02/25/21 1413      Check-In   Supervising physician immediately available to respond to emergencies See telemetry face sheet for immediately available ER MD    Location [facility]-Cardiac & Pulmonary Rehab    Staff Present Birdie Sons, MPA, Mauricia Area, BS, ACSM CEP, Exercise Physiologist;Kara Eliezer Bottom, MS Exercise Physiologist    Virtual Visit No    Medication changes reported     No    Fall or balance concerns reported    No    Warm-up and Cool-down Performed on first and last piece of equipment    Resistance Training Performed Yes    VAD Patient? No    PAD/SET Patient? No      Pain Assessment   Currently in Pain? No/denies              Social History   Tobacco Use  Smoking Status Former Smoker  . Packs/day: 1.00  . Years: 30.00  . Pack years: 30.00  . Types: Cigarettes  . Quit date: 10/27/2006  . Years since quitting: 14.3  Smokeless Tobacco Never Used    Goals Met:  Independence with exercise equipment Exercise tolerated well No report of cardiac concerns or symptoms Strength training completed today  Goals Unmet:  Not Applicable  Comments: First full day of exercise!  Patient was oriented to gym and equipment including functions, settings, policies, and procedures.  Patient's individual exercise prescription and treatment plan were reviewed.  All starting workloads were established based on the results of the 6 minute walk test done at initial orientation visit.  The plan for exercise progression was also introduced and progression will be customized based on patient's performance and goals.     Dr. Emily Filbert is Medical Director for [facility].

## 2021-02-27 ENCOUNTER — Encounter: Payer: Self-pay | Admitting: *Deleted

## 2021-02-27 DIAGNOSIS — R0609 Other forms of dyspnea: Secondary | ICD-10-CM

## 2021-02-27 DIAGNOSIS — R0600 Dyspnea, unspecified: Secondary | ICD-10-CM

## 2021-02-27 DIAGNOSIS — J8410 Pulmonary fibrosis, unspecified: Secondary | ICD-10-CM

## 2021-02-27 NOTE — Progress Notes (Signed)
Pulmonary Individual Treatment Plan  Patient Details  Name: Courtney Roth MRN: [id number] Date of Birth: [birth year] Referring Provider:   Flowsheet Row Pulmonary Rehab from 02/20/2021 in [facility] Cardiac and Pulmonary Rehab  Referring Provider Zetta Bills MD      Initial Encounter Date:  Flowsheet Row Pulmonary Rehab from 02/20/2021 in [facility] Cardiac and Pulmonary Rehab  Date 02/20/21      Visit Diagnosis: Pulmonary fibrosis ([place])  DOE (dyspnea on exertion)  Patient's Home Medications on Admission:  Current Outpatient Medications:  .  acetaminophen (TYLENOL) 500 MG tablet, Take 1,000 mg by mouth every 6 (six) hours as needed for mild pain (patient takes 1 - 2 depending on activity level)., Disp: , Rfl:  .  aspirin 81 MG tablet, Take 81 mg by mouth daily., Disp: , Rfl:  .  benzonatate (TESSALON) 100 MG capsule, Take 1 capsule (100 mg total) by mouth 3 (three) times daily as needed for cough. (Patient not taking: Reported on 02/12/2021), Disp: 30 capsule, Rfl: 0 .  Cholecalciferol (VITAMIN D3) 1000 UNITS CAPS, Take 1 capsule by mouth daily., Disp: , Rfl:  .  hydrochlorothiazide (HYDRODIURIL) 25 MG tablet, Take 25 mg by mouth daily., Disp: , Rfl:  .  levothyroxine (SYNTHROID) 50 MCG tablet, Take 50 mcg by mouth daily before breakfast., Disp: , Rfl:  .  losartan (COZAAR) 25 MG tablet, Take 1 tablet (25 mg total) by mouth daily., Disp: 90 tablet, Rfl: 3 .  metoprolol tartrate (LOPRESSOR) 25 MG tablet, TAKE 1 TABLET BY MOUTH TWICE A DAY (Patient not taking: Reported on 02/12/2021), Disp: 180 tablet, Rfl: 3 .  rosuvastatin (CRESTOR) 20 MG tablet, Take 1 tablet (20 mg total) by mouth daily., Disp: 90 tablet, Rfl: 3 .  sulfaSALAzine (AZULFIDINE) 500 MG tablet, Take 2 tablets (1,000 mg total) by mouth 2 (two) times daily. (Patient not taking: Reported on 02/12/2021), Disp: 120 tablet, Rfl: 3 .  traZODone (DESYREL) 50 MG tablet, , Disp: , Rfl:   Past Medical History: Past Medical History:   Diagnosis Date  . Colon polyps 03/04/2007   History of Hyperplastic  . Dyspnea   . GERD (gastroesophageal reflux disease)   . Hyperlipidemia   . Hypertension   . Obesity   . Pneumonia   . Stress reaction   . Ulcerative colitis     Tobacco Use: Social History   Tobacco Use  Smoking Status Former Smoker  . Packs/day: 1.00  . Years: 30.00  . Pack years: 30.00  . Types: Cigarettes  . Quit date: 10/27/2006  . Years since quitting: 14.3  Smokeless Tobacco Never Used    Labs: Recent Chemical engineer    Labs for ITP Cardiac and Pulmonary Rehab Latest Ref Rng & Units 07/08/2011 11/30/2013 01/17/2014 03/19/2015 10/18/2019   Cholestrol 0 - 200 mg/dL - 208(H) 192 195 197   LDLCALC 0 - 99 mg/dL - - 115(H) 121(H) 114(H)   LDLDIRECT 0 - 99 mg/dL - 155.2 - - 120.4(H)   HDL >40 mg/dL - 43.70 42.90 46.90 45   Trlycerides <150 mg/dL - 124.0 171.0(H) 137.0 188(H)   Hemoglobin A1c 4.6 - 6.5 % 5.9 5.9 - 5.6 -       Pulmonary Assessment Scores:  Pulmonary Assessment Scores    Row Name 02/20/21 1516         ADL UCSD   ADL Phase Entry     SOB Score total 57     Rest 0     Walk 0  Stairs 5     Bath 4     Dress 3     Shop 1           CAT Score   CAT Score 20           mMRC Score   mMRC Score 2            UCSD: Self-administered rating of dyspnea associated with activities of daily living (ADLs) 6-point scale (0 = "not at all" to 5 = "maximal or unable to do because of breathlessness")  Scoring Scores range from 0 to 120.  Minimally important difference is 5 units  CAT: CAT can identify the health impairment of COPD patients and is better correlated with disease progression.  CAT has a scoring range of zero to 40. The CAT score is classified into four groups of low (less than 10), medium (10 - 20), high (21-30) and very high (31-40) based on the impact level of disease on health status. A CAT score over 10 suggests significant symptoms.  A worsening CAT score could be  explained by an exacerbation, poor medication adherence, poor inhaler technique, or progression of COPD or comorbid conditions.  CAT MCID is 2 points  mMRC: mMRC (Modified Medical Research Council) Dyspnea Scale is used to assess the degree of baseline functional disability in patients of respiratory disease due to dyspnea. No minimal important difference is established. A decrease in score of 1 point or greater is considered a positive change.   Pulmonary Function Assessment:  Pulmonary Function Assessment - 02/12/21 0838      Breath   Shortness of Breath Yes;Limiting activity           Exercise Target Goals: Exercise Program Goal: Individual exercise prescription set using results from initial 6 min walk test and THRR while considering  patient's activity barriers and safety.   Exercise Prescription Goal: Initial exercise prescription builds to 30-45 minutes a day of aerobic activity, 2-3 days per week.  Home exercise guidelines will be given to patient during program as part of exercise prescription that the participant will acknowledge.  Education: Aerobic Exercise: - Group verbal and visual presentation on the components of exercise prescription. Introduces F.I.T.T principle from ACSM for exercise prescriptions.  Reviews F.I.T.T. principles of aerobic exercise including progression. Written material given at graduation.   Education: Resistance Exercise: - Group verbal and visual presentation on the components of exercise prescription. Introduces F.I.T.T principle from ACSM for exercise prescriptions  Reviews F.I.T.T. principles of resistance exercise including progression. Written material given at graduation.    Education: Exercise & Equipment Safety: - Individual verbal instruction and demonstration of equipment use and safety with use of the equipment. Flowsheet Row Pulmonary Rehab from 02/20/2021 in [facility] Cardiac and Pulmonary Rehab  Date 02/12/21  Educator [facility]  Instruction  Review Code 1- Verbalizes Understanding      Education: Exercise Physiology & General Exercise Guidelines: - Group verbal and written instruction with models to review the exercise physiology of the cardiovascular system and associated critical values. Provides general exercise guidelines with specific guidelines to those with heart or lung disease.    Education: Flexibility, Balance, Mind/Body Relaxation: - Group verbal and visual presentation with interactive activity on the components of exercise prescription. Introduces F.I.T.T principle from ACSM for exercise prescriptions. Reviews F.I.T.T. principles of flexibility and balance exercise training including progression. Also discusses the mind body connection.  Reviews various relaxation techniques to help reduce and manage stress (i.e. Deep breathing, progressive muscle  relaxation, and visualization). Balance handout provided to take home. Written material given at graduation.   Activity Barriers & Risk Stratification:  Activity Barriers & Cardiac Risk Stratification - 02/20/21 1507      Activity Barriers & Cardiac Risk Stratification   Activity Barriers Arthritis;Deconditioning;Muscular Weakness;Balance Concerns;Assistive Device;Shortness of Breath;Back Problems;Joint Problems   chronic knee pain from arthritis, uses walker for support routinely          6 Minute Walk:  6 Minute Walk    Row Name 02/20/21 1506         6 Minute Walk   Phase Initial     Distance 535 feet     Walk Time 6 minutes     # of Rest Breaks 0     MPH 1.01     METS 0.67     RPE 12     Perceived Dyspnea  2     VO2 Peak 2.34     Symptoms Yes (comment)     Comments chronic knee pain 5/10 (used rollator for support and encouraged to bring her walker each day)     Resting HR 56 bpm     Resting BP 146/84     Resting Oxygen Saturation  93 %     Exercise Oxygen Saturation  during 6 min walk 82 %     Max Ex. HR 103 bpm     Max Ex. BP 164/78     2 Minute  Post BP 142/70           Interval HR   1 Minute HR 87     2 Minute HR 96     3 Minute HR 99     4 Minute HR 100     5 Minute HR 101     6 Minute HR 103     2 Minute Post HR 78     Interval Heart Rate? Yes           Interval Oxygen   Interval Oxygen? Yes     Baseline Oxygen Saturation % 93 %     1 Minute Oxygen Saturation % 85 %     1 Minute Liters of Oxygen 0 L  Room Air     2 Minute Oxygen Saturation % 84 %     2 Minute Liters of Oxygen 0 L     3 Minute Oxygen Saturation % 84 %     3 Minute Liters of Oxygen 0 L     4 Minute Oxygen Saturation % 84 %     4 Minute Liters of Oxygen 0 L     5 Minute Oxygen Saturation % 82 %     5 Minute Liters of Oxygen 0 L     6 Minute Oxygen Saturation % 83 %     6 Minute Liters of Oxygen 0 L     2 Minute Post Oxygen Saturation % 92 %     2 Minute Post Liters of Oxygen 0 L           Oxygen Initial Assessment:  Oxygen Initial Assessment - 02/12/21 0838      Home Oxygen   Home Oxygen Device None    Sleep Oxygen Prescription None    Home Exercise Oxygen Prescription None    Home Resting Oxygen Prescription None      Initial 6 min Walk   Oxygen Used None      Program Oxygen Prescription   Program Oxygen Prescription None  Intervention   Short Term Goals To learn and understand importance of monitoring SPO2 with pulse oximeter and demonstrate accurate use of the pulse oximeter.;To learn and understand importance of maintaining oxygen saturations>88%;To learn and demonstrate proper pursed lip breathing techniques or other breathing techniques.;To learn and demonstrate proper use of respiratory medications    Long  Term Goals Verbalizes importance of monitoring SPO2 with pulse oximeter and return demonstration;Maintenance of O2 saturations>88%;Exhibits proper breathing techniques, such as pursed lip breathing or other method taught during program session;Compliance with respiratory medication;Demonstrates proper use of MDI's            Oxygen Re-Evaluation:  Oxygen Re-Evaluation    Row Name 02/25/21 1415             Program Oxygen Prescription   Program Oxygen Prescription None               Home Oxygen   Home Oxygen Device None       Sleep Oxygen Prescription None       Home Exercise Oxygen Prescription None       Home Resting Oxygen Prescription None               Goals/Expected Outcomes   Short Term Goals To learn and understand importance of monitoring SPO2 with pulse oximeter and demonstrate accurate use of the pulse oximeter.;To learn and understand importance of maintaining oxygen saturations>88%;To learn and demonstrate proper pursed lip breathing techniques or other breathing techniques.       Long  Term Goals Verbalizes importance of monitoring SPO2 with pulse oximeter and return demonstration;Maintenance of O2 saturations>88%;Exhibits proper breathing techniques, such as pursed lip breathing or other method taught during program session;Compliance with respiratory medication       Comments Reviewed PLB technique with pt.  Talked about how it works and it's importance in maintaining their exercise saturations.       Goals/Expected Outcomes Short: Become more profiecient at using PLB.   Long: Become independent at using PLB.              Oxygen Discharge (Final Oxygen Re-Evaluation):  Oxygen Re-Evaluation - 02/25/21 1415      Program Oxygen Prescription   Program Oxygen Prescription None      Home Oxygen   Home Oxygen Device None    Sleep Oxygen Prescription None    Home Exercise Oxygen Prescription None    Home Resting Oxygen Prescription None      Goals/Expected Outcomes   Short Term Goals To learn and understand importance of monitoring SPO2 with pulse oximeter and demonstrate accurate use of the pulse oximeter.;To learn and understand importance of maintaining oxygen saturations>88%;To learn and demonstrate proper pursed lip breathing techniques or other breathing techniques.    Long   Term Goals Verbalizes importance of monitoring SPO2 with pulse oximeter and return demonstration;Maintenance of O2 saturations>88%;Exhibits proper breathing techniques, such as pursed lip breathing or other method taught during program session;Compliance with respiratory medication    Comments Reviewed PLB technique with pt.  Talked about how it works and it's importance in maintaining their exercise saturations.    Goals/Expected Outcomes Short: Become more profiecient at using PLB.   Long: Become independent at using PLB.           Initial Exercise Prescription:  Initial Exercise Prescription - 02/20/21 1500      Date of Initial Exercise RX and Referring Provider   Date 02/20/21    Referring Provider Zetta Bills MD  NuStep   Level 1    SPM 80    Minutes 15    METs 1.5      Arm Ergometer   Level 1    Watts 10    RPM 25    Minutes 15    METs 1.5      Track   Laps 10    Minutes 15    METs 1.5      Prescription Details   Frequency (times per week) 3    Duration Progress to 30 minutes of continuous aerobic without signs/symptoms of physical distress      Intensity   THRR 40-80% of Max Heartrate 92-127    Ratings of Perceived Exertion 11-13    Perceived Dyspnea 0-4      Progression   Progression Continue to progress workloads to maintain intensity without signs/symptoms of physical distress.      Resistance Training   Training Prescription Yes    Weight 3 lb    Reps 10-15           Perform Capillary Blood Glucose checks as needed.  Exercise Prescription Changes:  Exercise Prescription Changes    Row Name 02/20/21 1500             Response to Exercise   Blood Pressure (Admit) 146/84       Blood Pressure (Exercise) 164/78       Blood Pressure (Exit) 136/64       Heart Rate (Admit) 56 bpm       Heart Rate (Exercise) 103 bpm       Heart Rate (Exit) 73 bpm       Oxygen Saturation (Admit) 93 %       Oxygen Saturation (Exercise) 82 %       Oxygen  Saturation (Exit) 91 %       Rating of Perceived Exertion (Exercise) 12       Perceived Dyspnea (Exercise) 2       Symptoms knee pain 5/10       Comments walk test results              Exercise Comments:  Exercise Comments    Row Name 02/25/21 1414           Exercise Comments First full day of exercise!  Patient was oriented to gym and equipment including functions, settings, policies, and procedures.  Patient's individual exercise prescription and treatment plan were reviewed.  All starting workloads were established based on the results of the 6 minute walk test done at initial orientation visit.  The plan for exercise progression was also introduced and progression will be customized based on patient's performance and goals.              Exercise Goals and Review:  Exercise Goals    Row Name 02/20/21 1510             Exercise Goals   Increase Physical Activity Yes       Intervention Provide advice, education, support and counseling about physical activity/exercise needs.;Develop an individualized exercise prescription for aerobic and resistive training based on initial evaluation findings, risk stratification, comorbidities and participant's personal goals.       Expected Outcomes Short Term: Attend rehab on a regular basis to increase amount of physical activity.;Long Term: Add in home exercise to make exercise part of routine and to increase amount of physical activity.;Long Term: Exercising regularly at least 3-5 days a week.  Increase Strength and Stamina Yes       Intervention Provide advice, education, support and counseling about physical activity/exercise needs.;Develop an individualized exercise prescription for aerobic and resistive training based on initial evaluation findings, risk stratification, comorbidities and participant's personal goals.       Expected Outcomes Short Term: Increase workloads from initial exercise prescription for resistance, speed, and  METs.;Short Term: Perform resistance training exercises routinely during rehab and add in resistance training at home;Long Term: Improve cardiorespiratory fitness, muscular endurance and strength as measured by increased METs and functional capacity (6MWT)       Able to understand and use rate of perceived exertion (RPE) scale Yes       Intervention Provide education and explanation on how to use RPE scale       Expected Outcomes Short Term: Able to use RPE daily in rehab to express subjective intensity level;Long Term:  Able to use RPE to guide intensity level when exercising independently       Able to understand and use Dyspnea scale Yes       Intervention Provide education and explanation on how to use Dyspnea scale       Expected Outcomes Long Term: Able to use Dyspnea scale to guide intensity level when exercising independently;Short Term: Able to use Dyspnea scale daily in rehab to express subjective sense of shortness of breath during exertion       Knowledge and understanding of Target Heart Rate Range (THRR) Yes       Intervention Provide education and explanation of THRR including how the numbers were predicted and where they are located for reference       Expected Outcomes Short Term: Able to state/look up THRR;Short Term: Able to use daily as guideline for intensity in rehab;Long Term: Able to use THRR to govern intensity when exercising independently       Able to check pulse independently Yes       Intervention Provide education and demonstration on how to check pulse in carotid and radial arteries.;Review the importance of being able to check your own pulse for safety during independent exercise       Expected Outcomes Short Term: Able to explain why pulse checking is important during independent exercise;Long Term: Able to check pulse independently and accurately       Understanding of Exercise Prescription Yes       Intervention Provide education, explanation, and written materials  on patient's individual exercise prescription       Expected Outcomes Long Term: Able to explain home exercise prescription to exercise independently;Short Term: Able to explain program exercise prescription              Exercise Goals Re-Evaluation :  Exercise Goals Re-Evaluation    [place] Name 02/25/21 1414             Exercise Goal Re-Evaluation   Exercise Goals Review Able to understand and use rate of perceived exertion (RPE) scale;Knowledge and understanding of Target Heart Rate Range (THRR);Increase Physical Activity;Understanding of Exercise Prescription;Increase Strength and Stamina;Able to understand and use Dyspnea scale;Able to check pulse independently       Comments Reviewed RPE and dyspnea scales, THR and program prescription with pt today.  Pt voiced understanding and was given a copy of goals to take home.       Expected Outcomes Short: Use RPE daily to regulate intensity. Long: Follow program prescription in THR.  Discharge Exercise Prescription (Final Exercise Prescription Changes):  Exercise Prescription Changes - 02/20/21 1500      Response to Exercise   Blood Pressure (Admit) 146/84    Blood Pressure (Exercise) 164/78    Blood Pressure (Exit) 136/64    Heart Rate (Admit) 56 bpm    Heart Rate (Exercise) 103 bpm    Heart Rate (Exit) 73 bpm    Oxygen Saturation (Admit) 93 %    Oxygen Saturation (Exercise) 82 %    Oxygen Saturation (Exit) 91 %    Rating of Perceived Exertion (Exercise) 12    Perceived Dyspnea (Exercise) 2    Symptoms knee pain 5/10    Comments walk test results           Nutrition:  Target Goals: Understanding of nutrition guidelines, daily intake of sodium <1556m, cholesterol <205m calories 30% from fat and 7% or less from saturated fats, daily to have 5 or more servings of fruits and vegetables.  Education: All About Nutrition: -Group instruction provided by verbal, written material, interactive activities,  discussions, models, and posters to present general guidelines for heart healthy nutrition including fat, fiber, MyPlate, the role of sodium in heart healthy nutrition, utilization of the nutrition label, and utilization of this knowledge for meal planning. Follow up email sent as well. Written material given at graduation.   Biometrics:  Pre Biometrics - 02/20/21 1512      Pre Biometrics   Height 5' 0.2" (1.529 m)    Weight 226 lb 1.6 oz (102.6 kg)    BMI (Calculated) 43.87    Single Leg Stand 0 seconds   unable to balance           Nutrition Therapy Plan and Nutrition Goals:  Nutrition Therapy & Goals - 02/20/21 1514      Personal Nutrition Goals   Comments Made appt with dietitian      Intervention Plan   Intervention Prescribe, educate and counsel regarding individualized specific dietary modifications aiming towards targeted core components such as weight, hypertension, lipid management, diabetes, heart failure and other comorbidities.    Expected Outcomes Short Term Goal: Understand basic principles of dietary content, such as calories, fat, sodium, cholesterol and nutrients.;Short Term Goal: A plan has been developed with personal nutrition goals set during dietitian appointment.;Long Term Goal: Adherence to prescribed nutrition plan.           Nutrition Assessments:  MEDIFICTS Score Key:  ?70 Need to make dietary changes   40-70 Heart Healthy Diet  ? 40 Therapeutic Level Cholesterol Diet  [facility]  Picture Your Plate Total Score on Admission 66     Picture Your Plate Scores:  <4<49nhealthy dietary pattern with much room for improvement.  41-50 Dietary pattern unlikely to meet recommendations for good health and room for improvement.  51-60 More healthful dietary pattern, with some room for improvement.   >60 Healthy dietary pattern, although there may be some specific behaviors that  could be improved.   Nutrition Goals Re-Evaluation:   Nutrition Goals Discharge (Final Nutrition Goals Re-Evaluation):   Psychosocial: Target Goals: Acknowledge presence or absence of significant depression and/or stress, maximize coping skills, provide positive support system. Participant is able to verbalize types and ability to use techniques and skills needed for reducing stress and depression.   Education: Stress, Anxiety, and Depression - Group verbal and visual presentation to define topics covered.  Reviews how body is impacted by stress,  anxiety, and depression.  Also discusses healthy ways to reduce stress and to treat/manage anxiety and depression.  Written material given at graduation.   Education: Sleep Hygiene -Provides group verbal and written instruction about how sleep can affect your health.  Define sleep hygiene, discuss sleep cycles and impact of sleep habits. Review good sleep hygiene tips.    Initial Review & Psychosocial Screening:  Initial Psych Review & Screening - 02/12/21 0841      Initial Review   Current issues with None Identified      Family Dynamics   Good Support System? Yes    Comments She can look to her son and fiance for support. She is willing to make changes to her health.      Barriers   Psychosocial barriers to participate in program The patient should benefit from training in stress management and relaxation.      Screening Interventions   Interventions Encouraged to exercise;Provide feedback about the scores to participant;To provide support and resources with identified psychosocial needs    Expected Outcomes Long Term Goal: Stressors or current issues are controlled or eliminated.;Short Term goal: Utilizing psychosocial counselor, staff and physician to assist with identification of specific Stressors or current issues interfering with healing process. Setting desired goal for each stressor or current issue identified.;Short Term goal:  Identification and review with participant of any Quality of Life or Depression concerns found by scoring the questionnaire.;Long Term goal: The participant improves quality of Life and PHQ9 Scores as seen by post scores and/or verbalization of changes           Quality of Life Scores:  Scores of 19 and below usually indicate a poorer quality of life in these areas.  A difference of  2-3 points is a clinically meaningful difference.  A difference of 2-3 points in the total score of the Quality of Life Index has been associated with significant improvement in overall quality of life, self-image, physical symptoms, and general health in studies assessing change in quality of life.  PHQ-9: Recent Review Flowsheet Data    Depression screen [facility] 2/9 02/20/2021 01/11/2018   Decreased Interest 0 0   Down, Depressed, Hopeless 0 0   PHQ - 2 Score 0 0   Altered sleeping 2 -   Tired, decreased energy 2 -   Change in appetite 0 -   Feeling bad or failure about yourself  0 -   Trouble concentrating 0 -   Moving slowly or fidgety/restless 0 -   Suicidal thoughts 0 -   PHQ-9 Score 4 -   Difficult doing work/chores Not difficult at all -     Interpretation of Total Score  Total Score Depression Severity:  1-4 = Minimal depression, 5-9 = Mild depression, 10-14 = Moderate depression, 15-19 = Moderately severe depression, 20-27 = Severe depression   Psychosocial Evaluation and Intervention:  Psychosocial Evaluation - 02/12/21 0843      Psychosocial Evaluation & Interventions   Interventions Encouraged to exercise with the program and follow exercise prescription;Relaxation education;Stress management education    Comments She can look to her son and fiance for support. She is willing to make changes to her health.    Expected Outcomes Short: Exercise regularly to support mental health and notify staff of any changes. Long: maintain mental health and well being through teaching of rehab or prescribed  medications independently.    Continue Psychosocial Services  Follow up required by staff  Psychosocial Re-Evaluation:   Psychosocial Discharge (Final Psychosocial Re-Evaluation):   Education: Education Goals: Education classes will be provided on a weekly basis, covering required topics. Participant will state understanding/return demonstration of topics presented.  Learning Barriers/Preferences:  Learning Barriers/Preferences - 02/12/21 4970      Learning Barriers/Preferences   Learning Barriers None    Learning Preferences None           General Pulmonary Education Topics:  Infection Prevention: - Provides verbal and written material to individual with discussion of infection control including proper hand washing and proper equipment cleaning during exercise session. Flowsheet Row Pulmonary Rehab from 02/20/2021 in [facility]  Date 02/12/21  Educator [facility]  Instruction Review Code 1- Verbalizes Understanding      Falls Prevention: - Provides verbal and written material to individual with discussion of falls prevention and safety. Flowsheet Row Pulmonary Rehab from 02/20/2021 in [facility]  Date 02/12/21  Educator [facility]  Instruction Review Code 1- Verbalizes Understanding      Chronic Lung Disease Review: - Group verbal instruction with posters, models, PowerPoint presentations and videos,  to review new updates, new respiratory medications, new advancements in procedures and treatments. Providing information on websites and "800" numbers for continued self-education. Includes information about supplement oxygen, available portable oxygen systems, continuous and intermittent flow rates, oxygen safety, concentrators, and Medicare reimbursement for oxygen. Explanation of Pulmonary Drugs, including class, frequency, complications, importance of spacers, rinsing mouth after steroid MDI's, and proper cleaning methods for  nebulizers. Review of basic lung anatomy and physiology related to function, structure, and complications of lung disease. Review of risk factors. Discussion about methods for diagnosing sleep apnea and types of masks and machines for OSA. Includes a review of the use of types of environmental controls: home humidity, furnaces, filters, dust mite/pet prevention, HEPA vacuums. Discussion about weather changes, air quality and the benefits of nasal washing. Instruction on Warning signs, infection symptoms, calling MD promptly, preventive modes, and value of vaccinations. Review of effective airway clearance, coughing and/or vibration techniques. Emphasizing that all should Create an Action Plan. Written material given at graduation. Flowsheet Row Pulmonary Rehab from 02/20/2021 in [facility]  Education need identified 02/20/21      AED/CPR: - Group verbal and written instruction with the use of models to demonstrate the basic use of the AED with the basic ABC's of resuscitation.    Anatomy and Cardiac Procedures: - Group verbal and visual presentation and models provide information about basic cardiac anatomy and function. Reviews the testing methods done to diagnose heart disease and the outcomes of the test results. Describes the treatment choices: Medical Management, Angioplasty, or Coronary Bypass Surgery for treating various heart conditions including Myocardial Infarction, Angina, Valve Disease, and Cardiac Arrhythmias.  Written material given at graduation.   Medication Safety: - Group verbal and visual instruction to review commonly prescribed medications for heart and lung disease. Reviews the medication, class of the drug, and side effects. Includes the steps to properly store meds and maintain the prescription regimen.  Written material given at graduation.   Other: -Provides group and verbal instruction on various topics (see comments)   Knowledge Questionnaire  Score:  Knowledge Questionnaire Score - 02/20/21 1515      Knowledge Questionnaire Score   Pre Score 15/18 Education Focus: O2 safety            Core Components/Risk Factors/Patient Goals at Admission:  Personal Goals and Risk Factors at  Admission - 02/20/21 1515      Core Components/Risk Factors/Patient Goals on Admission    Weight Management Yes;Weight Loss;Obesity    Intervention Weight Management: Develop a combined nutrition and exercise program designed to reach desired caloric intake, while maintaining appropriate intake of nutrient and fiber, sodium and fats, and appropriate energy expenditure required for the weight goal.;Weight Management: Provide education and appropriate resources to help participant work on and attain dietary goals.;Weight Management/Obesity: Establish reasonable short term and long term weight goals.;Obesity: Provide education and appropriate resources to help participant work on and attain dietary goals.    Admit Weight 226 lb 1.6 oz (102.6 kg)    Goal Weight: Short Term 220 lb (99.8 kg)    Goal Weight: Long Term 215 lb (97.5 kg)    Expected Outcomes Long Term: Adherence to nutrition and physical activity/exercise program aimed toward attainment of established weight goal;Short Term: Continue to assess and modify interventions until short term weight is achieved;Weight Loss: Understanding of general recommendations for a balanced deficit meal plan, which promotes 1-2 lb weight loss per week and includes a negative energy balance of [phone] kcal/d;Understanding recommendations for meals to include 15-35% energy as protein, 25-35% energy from fat, 35-60% energy from carbohydrates, less than 260m of dietary cholesterol, 20-35 gm of total fiber daily;Understanding of distribution of calorie intake throughout the day with the consumption of 4-5 meals/snacks    Improve shortness of breath with ADL's Yes    Intervention Provide education, individualized exercise plan  and daily activity instruction to help decrease symptoms of SOB with activities of daily living.    Expected Outcomes Long Term: Be able to perform more ADLs without symptoms or delay the onset of symptoms;Short Term: Improve cardiorespiratory fitness to achieve a reduction of symptoms when performing ADLs    Hypertension Yes    Intervention Provide education on lifestyle modifcations including regular physical activity/exercise, weight management, moderate sodium restriction and increased consumption of fresh fruit, vegetables, and low fat dairy, alcohol moderation, and smoking cessation.;Monitor prescription use compliance.    Expected Outcomes Short Term: Continued assessment and intervention until BP is < 140/971mHG in hypertensive participants. < 130/8074mG in hypertensive participants with diabetes, heart failure or chronic kidney disease.;Long Term: Maintenance of blood pressure at goal levels.    Lipids Yes    Intervention Provide education and support for participant on nutrition & aerobic/resistive exercise along with prescribed medications to achieve LDL <38m42mDL >40mg58m Expected Outcomes Short Term: Participant states understanding of desired cholesterol values and is compliant with medications prescribed. Participant is following exercise prescription and nutrition guidelines.;Long Term: Cholesterol controlled with medications as prescribed, with individualized exercise RX and with personalized nutrition plan. Value goals: LDL < 38mg,46m > 40 mg.           Education:Diabetes - Individual verbal and written instruction to review signs/symptoms of diabetes, desired ranges of glucose level fasting, after meals and with exercise. Acknowledge that pre and post exercise glucose checks will be done for 3 sessions at entry of program.   Know Your Numbers and Heart Failure: - Group verbal and visual instruction to discuss disease risk factors for cardiac and pulmonary disease and  treatment options.  Reviews associated critical values for Overweight/Obesity, Hypertension, Cholesterol, and Diabetes.  Discusses basics of heart failure: signs/symptoms and treatments.  Introduces Heart Failure Zone chart for action plan for heart failure.  Written material given at graduation.   Core Components/Risk Factors/Patient Goals Review:  Core Components/Risk Factors/Patient Goals at Discharge (Final Review):    ITP Comments:  ITP Comments    Row Name 02/12/21 0841 02/20/21 1506 02/25/21 1414 02/27/21 1425     ITP Comments Virtual Visit completed. Patient informed on EP and RD appointment and 6 Minute walk test. Patient also informed of patient health questionnaires on My Chart. Patient Verbalizes understanding. Visit diagnosis can be found in [facility] 01/24/2021. Completed 6MWT and gym orientation. Initial ITP created and sent for review to Dr. Emily Filbert, Medical Director. First full day of exercise!  Patient was oriented to gym and equipment including functions, settings, policies, and procedures.  Patient's individual exercise prescription and treatment plan were reviewed.  All starting workloads were established based on the results of the 6 minute walk test done at initial orientation visit.  The plan for exercise progression was also introduced and progression will be customized based on patient's performance and goals. Barth Kirks called and stated that she was very sore after her first day.  She did not feel that her knees were going to be able to tolerate the program at this time.  She was encouraged to talked to her doctor about her knees and pursue physical therapy first.  We will discharge her at this time.           Comments: Discharge ITP (only completed 2 sessions)

## 2021-04-30 ENCOUNTER — Other Ambulatory Visit: Payer: Self-pay

## 2021-04-30 ENCOUNTER — Ambulatory Visit (INDEPENDENT_AMBULATORY_CARE_PROVIDER_SITE_OTHER): Payer: Medicare Other | Admitting: Cardiovascular Disease

## 2021-04-30 ENCOUNTER — Encounter: Payer: Self-pay | Admitting: Cardiovascular Disease

## 2021-04-30 VITALS — BP 126/70 | HR 61 | Ht 59.0 in | Wt 234.5 lb

## 2021-04-30 DIAGNOSIS — I25118 Atherosclerotic heart disease of native coronary artery with other forms of angina pectoris: Secondary | ICD-10-CM

## 2021-04-30 DIAGNOSIS — I259 Chronic ischemic heart disease, unspecified: Secondary | ICD-10-CM | POA: Diagnosis not present

## 2021-04-30 DIAGNOSIS — I272 Pulmonary hypertension, unspecified: Secondary | ICD-10-CM

## 2021-04-30 DIAGNOSIS — I10 Essential (primary) hypertension: Secondary | ICD-10-CM

## 2021-04-30 DIAGNOSIS — E785 Hyperlipidemia, unspecified: Secondary | ICD-10-CM

## 2021-04-30 NOTE — H&P (View-Only) (Signed)
Cardiology Office Note   Date:  04/30/2021   ID:  Courtney Roth, Courtney Roth [birth year], MRN [id number]  PCP:  Casilda Carls, MD  Cardiologist:   Kathlyn Sacramento, MD   Chief Complaint  Patient presents with   Other    3 month f/u no complaints today. Meds reviewed verbally with pt.      History of Present Illness: Courtney Roth is a 75 y.o. female who is here today for follow-up visit regarding regarding pulmonary hypertension, abnormal nuclear stress test and possible underlying ischemic heart disease.  She has chronic medical conditions that include hypertension, chronic back pain, morbid obesity, hyperlipidemia, stage III chronic kidney disease, essential hypertension ulcerative colitis and GERD. She is a previous smoker and quit about 20 years ago. She had an injury to her back in 2006 .  She had shortness of breath and atypical chest pain in 2019. She underwent a treadmill nuclear stress test in 2019 with Dr.Jadali.  She was able to exercise for only 2 minutes and 13 seconds.  She achieved 85% maximal predicted heart rate.  There was moderate apical and anteroseptal defect which was reversible and suggestive of ischemia.  The patient declined cardiac catheterization and has been relatively stable since then.  She is being treated for presumed underlying coronary artery disease. Echocardiogram in 2019 showed an EF of 50 to 55% with grade 1 diastolic dysfunction and mild mitral regurgitation.  Pulmonary pressure could not be estimated.  She had sinus tachycardia that improved with addition of metoprolol.  She was sick in January with Covid-like symptoms. She was not tested but continued to have symptoms of cough and shortness of breath for about 6 weeks. Since then, her breathing did not go back to baseline. She is not vaccinated for COVID-19. She was referred for CT scan of the lungs which showed multiple areas of focal densities consistent with resolving infectious/inflammatory  process. In addition, she was found to have coronary artery and aortic calcifications. She was seen by pulmonary and had COVID-19 antibodies tested which came back positive.  Echocardiogram was repeated in March 2022 given worsening exertional dyspnea.  It showed an EF of 50 to 37%, grade 1 diastolic dysfunction, severe pulmonary hypertension with estimated pulmonary systolic pressure of 67 mmHg, mild to moderate tricuspid regurgitation and normal size IVC.  The patient has significant exertional dyspnea and has gradually cut down her physical activities.  She uses a walker most of the time and sometimes a wheelchair.  She gets out of breath walking 20 yards on flat level and she has to stop.  No significant chest discomfort.  Past Medical History:  Diagnosis Date   Colon polyps 03/04/2007   History of Hyperplastic   Dyspnea    GERD (gastroesophageal reflux disease)    Hyperlipidemia    Hypertension    Obesity    Pneumonia    Stress reaction    Ulcerative colitis     Past Surgical History:  Procedure Laterality Date   COLONOSCOPY WITH PROPOFOL N/A 11/19/2018   Procedure: COLONOSCOPY WITH PROPOFOL;  Surgeon: Virgel Manifold, MD;  Location: [facility] ENDOSCOPY;  Service: Endoscopy;  Laterality: N/A;   ruptured tubal pregnancy  1975   1 ovary removed     Current Outpatient Medications  Medication Sig Dispense Refill   acetaminophen (TYLENOL) 500 MG tablet Take 1,000 mg by mouth every 6 (six) hours as needed for mild pain (patient takes 1 - 2 depending on activity level).  aspirin 81 MG tablet Take 81 mg by mouth daily.     Cholecalciferol (VITAMIN D3) 1000 UNITS CAPS Take 1 capsule by mouth daily.     hydrochlorothiazide (HYDRODIURIL) 25 MG tablet Take 12.5 mg by mouth daily.     levothyroxine (SYNTHROID) 50 MCG tablet Take 50 mcg by mouth daily before breakfast.     losartan (COZAAR) 25 MG tablet Take 50 mg by mouth daily.     metoprolol tartrate (LOPRESSOR) 25 MG tablet Take 50  mg by mouth daily at 8 pm.     Pirfenidone 267 MG TABS SMARTSIG:3 Tablet(s) By Mouth Every 8 Hours PRN     rosuvastatin (CRESTOR) 20 MG tablet Take 1 tablet (20 mg total) by mouth daily. 90 tablet 3   sulfaSALAzine (AZULFIDINE) 500 MG tablet Take 2 tablets (1,000 mg total) by mouth 2 (two) times daily. 120 tablet 3   traZODone (DESYREL) 50 MG tablet Take 50 mg by mouth as needed.     No current facility-administered medications for this visit.    Allergies:   Codeine, Metoprolol, Metoprolol succinate, and Other    Social History:  The patient  reports that she quit smoking about 14 years ago. Her smoking use included cigarettes. She has a 30.00 pack-year smoking history. She has never used smokeless tobacco. She reports current alcohol use. She reports that she does not use drugs.   Family History:  The patient's family history includes Breast cancer in her cousin and maternal aunt; Clotting disorder in her mother; Crohn's disease in her sister; Diabetes in her paternal uncle; GER disease in her mother; Heart disease in her father and mother; Hypertension in her mother; Osteoporosis in her mother.    ROS:  Please see the history of present illness.   Otherwise, review of systems are positive for none.   All other systems are reviewed and negative.    PHYSICAL EXAM: VS:  BP 126/70 (BP Location: Left Wrist, Patient Position: Sitting, Cuff Size: Large)   Pulse 61   Ht 4' 11"  (1.499 m)   Wt 234 lb 8 oz (106.4 kg)   SpO2 95%   BMI 47.36 kg/m  , BMI Body mass index is 47.36 kg/m. GEN: Well nourished, well developed, in no acute distress  HEENT: normal  Neck: no JVD, carotid bruits, or masses Cardiac: RRR; no murmurs, rubs, or gallops,no edema  Respiratory:  clear to auscultation bilaterally, normal work of breathing GI: soft, nontender, nondistended, + BS MS: no deformity or atrophy  Skin: warm and dry, no rash Neuro:  Strength and sensation are intact Psych: euthymic mood, full  affect   EKG:  EKG is not ordered today.    Recent Labs: No results found for requested labs within last 8760 hours.    Lipid Panel    Component Value Date/Time   CHOL 197 10/18/2019 1515   TRIG 188 (H) 10/18/2019 1515   HDL 45 10/18/2019 1515   CHOLHDL 4.4 10/18/2019 1515   VLDL 38 10/18/2019 1515   LDLCALC 114 (H) 10/18/2019 1515   LDLDIRECT 120.4 (H) 10/18/2019 1515      Wt Readings from Last 3 Encounters:  04/30/21 234 lb 8 oz (106.4 kg)  02/20/21 226 lb 1.6 oz (102.6 kg)  01/29/21 224 lb 8 oz (101.8 kg)       PAD Screen 06/22/2018  Previous PAD dx? No  Previous surgical procedure? No  Pain with walking? No  Feet/toe relief with dangling? No  Painful, non-healing ulcers? No  Extremities discolored? No      ASSESSMENT AND PLAN:  1.  chronic ischemic heart disease with abnormal nuclear stress test in the past: In addition, recent CT of the lungs showed evidence of coronary atherosclerosis. Continuing medical therapy with aspirin, metoprolol and a statin.   She has significant exertional dyspnea likely anginal equivalent.  In addition, there is evidence of severe pulmonary hypertension on echocardiogram.  Based on this, I have recommended proceeding with a right and left cardiac catheterization and possible PCI.  I discussed the procedure in details as well as risks and benefits.    2.  Severe pulmonary hypertension: This will be further evaluated with a right heart catheterization.   3  Essential hypertension: Blood pressure is controlled.  Dry cough resolved after she was switched from lisinopril to losartan.   4.   Hyperlipidemia: Continue rosuvastatin with a target LDL of less than 70.     Disposition:   FU with me in 1 months  Signed,  Kathlyn Sacramento, MD  04/30/2021 3:00 PM    [place]

## 2021-04-30 NOTE — Progress Notes (Signed)
Cardiology Office Note   Date:  04/30/2021   ID:  Ladonne, Sharples [birth year], MRN [id number]  PCP:  Casilda Carls, MD  Cardiologist:   Kathlyn Sacramento, MD   Chief Complaint  Patient presents with   Other    3 month f/u no complaints today. Meds reviewed verbally with pt.      History of Present Illness: Courtney Roth is a 75 y.o. female who is here today for follow-up visit regarding regarding pulmonary hypertension, abnormal nuclear stress test and possible underlying ischemic heart disease.  She has chronic medical conditions that include hypertension, chronic back pain, morbid obesity, hyperlipidemia, stage III chronic kidney disease, essential hypertension ulcerative colitis and GERD. She is a previous smoker and quit about 20 years ago. She had an injury to her back in 2006 .  She had shortness of breath and atypical chest pain in 2019. She underwent a treadmill nuclear stress test in 2019 with Dr.Jadali.  She was able to exercise for only 2 minutes and 13 seconds.  She achieved 85% maximal predicted heart rate.  There was moderate apical and anteroseptal defect which was reversible and suggestive of ischemia.  The patient declined cardiac catheterization and has been relatively stable since then.  She is being treated for presumed underlying coronary artery disease. Echocardiogram in 2019 showed an EF of 50 to 55% with grade 1 diastolic dysfunction and mild mitral regurgitation.  Pulmonary pressure could not be estimated.  She had sinus tachycardia that improved with addition of metoprolol.  She was sick in January with Covid-like symptoms. She was not tested but continued to have symptoms of cough and shortness of breath for about 6 weeks. Since then, her breathing did not go back to baseline. She is not vaccinated for COVID-19. She was referred for CT scan of the lungs which showed multiple areas of focal densities consistent with resolving infectious/inflammatory  process. In addition, she was found to have coronary artery and aortic calcifications. She was seen by pulmonary and had COVID-19 antibodies tested which came back positive.  Echocardiogram was repeated in March 2022 given worsening exertional dyspnea.  It showed an EF of 50 to 81%, grade 1 diastolic dysfunction, severe pulmonary hypertension with estimated pulmonary systolic pressure of 67 mmHg, mild to moderate tricuspid regurgitation and normal size IVC.  The patient has significant exertional dyspnea and has gradually cut down her physical activities.  She uses a walker most of the time and sometimes a wheelchair.  She gets out of breath walking 20 yards on flat level and she has to stop.  No significant chest discomfort.  Past Medical History:  Diagnosis Date   Colon polyps 03/04/2007   History of Hyperplastic   Dyspnea    GERD (gastroesophageal reflux disease)    Hyperlipidemia    Hypertension    Obesity    Pneumonia    Stress reaction    Ulcerative colitis     Past Surgical History:  Procedure Laterality Date   COLONOSCOPY WITH PROPOFOL N/A 11/19/2018   Procedure: COLONOSCOPY WITH PROPOFOL;  Surgeon: Virgel Manifold, MD;  Location: [facility] ENDOSCOPY;  Service: Endoscopy;  Laterality: N/A;   ruptured tubal pregnancy  1975   1 ovary removed     Current Outpatient Medications  Medication Sig Dispense Refill   acetaminophen (TYLENOL) 500 MG tablet Take 1,000 mg by mouth every 6 (six) hours as needed for mild pain (patient takes 1 - 2 depending on activity level).  aspirin 81 MG tablet Take 81 mg by mouth daily.     Cholecalciferol (VITAMIN D3) 1000 UNITS CAPS Take 1 capsule by mouth daily.     hydrochlorothiazide (HYDRODIURIL) 25 MG tablet Take 12.5 mg by mouth daily.     levothyroxine (SYNTHROID) 50 MCG tablet Take 50 mcg by mouth daily before breakfast.     losartan (COZAAR) 25 MG tablet Take 50 mg by mouth daily.     metoprolol tartrate (LOPRESSOR) 25 MG tablet Take 50  mg by mouth daily at 8 pm.     Pirfenidone 267 MG TABS SMARTSIG:3 Tablet(s) By Mouth Every 8 Hours PRN     rosuvastatin (CRESTOR) 20 MG tablet Take 1 tablet (20 mg total) by mouth daily. 90 tablet 3   sulfaSALAzine (AZULFIDINE) 500 MG tablet Take 2 tablets (1,000 mg total) by mouth 2 (two) times daily. 120 tablet 3   traZODone (DESYREL) 50 MG tablet Take 50 mg by mouth as needed.     No current facility-administered medications for this visit.    Allergies:   Codeine, Metoprolol, Metoprolol succinate, and Other    Social History:  The patient  reports that she quit smoking about 14 years ago. Her smoking use included cigarettes. She has a 30.00 pack-year smoking history. She has never used smokeless tobacco. She reports current alcohol use. She reports that she does not use drugs.   Family History:  The patient's family history includes Breast cancer in her cousin and maternal aunt; Clotting disorder in her mother; Crohn's disease in her sister; Diabetes in her paternal uncle; GER disease in her mother; Heart disease in her father and mother; Hypertension in her mother; Osteoporosis in her mother.    ROS:  Please see the history of present illness.   Otherwise, review of systems are positive for none.   All other systems are reviewed and negative.    PHYSICAL EXAM: VS:  BP 126/70 (BP Location: Left Wrist, Patient Position: Sitting, Cuff Size: Large)   Pulse 61   Ht 4' 11"  (1.499 m)   Wt 234 lb 8 oz (106.4 kg)   SpO2 95%   BMI 47.36 kg/m  , BMI Body mass index is 47.36 kg/m. GEN: Well nourished, well developed, in no acute distress  HEENT: normal  Neck: no JVD, carotid bruits, or masses Cardiac: RRR; no murmurs, rubs, or gallops,no edema  Respiratory:  clear to auscultation bilaterally, normal work of breathing GI: soft, nontender, nondistended, + BS MS: no deformity or atrophy  Skin: warm and dry, no rash Neuro:  Strength and sensation are intact Psych: euthymic mood, full  affect   EKG:  EKG is not ordered today.    Recent Labs: No results found for requested labs within last 8760 hours.    Lipid Panel    Component Value Date/Time   CHOL 197 10/18/2019 1515   TRIG 188 (H) 10/18/2019 1515   HDL 45 10/18/2019 1515   CHOLHDL 4.4 10/18/2019 1515   VLDL 38 10/18/2019 1515   LDLCALC 114 (H) 10/18/2019 1515   LDLDIRECT 120.4 (H) 10/18/2019 1515      Wt Readings from Last 3 Encounters:  04/30/21 234 lb 8 oz (106.4 kg)  02/20/21 226 lb 1.6 oz (102.6 kg)  01/29/21 224 lb 8 oz (101.8 kg)       PAD Screen 06/22/2018  Previous PAD dx? No  Previous surgical procedure? No  Pain with walking? No  Feet/toe relief with dangling? No  Painful, non-healing ulcers? No  Extremities discolored? No      ASSESSMENT AND PLAN:  1.  chronic ischemic heart disease with abnormal nuclear stress test in the past: In addition, recent CT of the lungs showed evidence of coronary atherosclerosis. Continuing medical therapy with aspirin, metoprolol and a statin.   She has significant exertional dyspnea likely anginal equivalent.  In addition, there is evidence of severe pulmonary hypertension on echocardiogram.  Based on this, I have recommended proceeding with a right and left cardiac catheterization and possible PCI.  I discussed the procedure in details as well as risks and benefits.    2.  Severe pulmonary hypertension: This will be further evaluated with a right heart catheterization.   3  Essential hypertension: Blood pressure is controlled.  Dry cough resolved after she was switched from lisinopril to losartan.   4.   Hyperlipidemia: Continue rosuvastatin with a target LDL of less than 70.     Disposition:   FU with me in 1 months  Signed,  Kathlyn Sacramento, MD  04/30/2021 3:00 PM    [place]

## 2021-04-30 NOTE — Patient Instructions (Addendum)
Medication Instructions:  Your physician recommends that you continue on your current medications as directed. Please refer to the Current Medication list given to you today.  *If you need a refill on your cardiac medications before your next appointment, please call your pharmacy*   Lab Work: BMP, CBC drawn today  If you have labs (blood work) drawn today and your tests are completely normal, you will receive your results only by: [place] (if you have MyChart) OR A paper copy in the mail If you have any lab test that is abnormal or we need to change your treatment, we will call you to review the results.   Testing/Procedures:  You are scheduled for a Cardiac Catheterization on Monday, July 11 with Dr. Kathlyn Sacramento.  1. Please arrive at the [facility] at 9:30 AM (This time is one hour before your procedure to ensure your preparation). Free valet parking service is available.   Special note: Every effort is made to have your procedure done on time. Please understand that emergencies sometimes delay scheduled procedures.  2. Diet: Do not eat solid foods after midnight.  The patient may have clear liquids until 5am upon the day of the procedure.  3. Labs: Drawn during Office Visit  4. Medication instructions in preparation for your procedure:   Contrast Allergy: No  On the morning of your procedure, take your Aspirin and any morning medicines NOT listed above.  You may use sips of water.  5. Plan for one night stay--bring personal belongings. 6. Bring a current list of your medications and current insurance cards. 7. You MUST have a responsible person to drive you home. 8. Someone MUST be with you the first 24 hours after you arrive home or your discharge will be delayed. 9. Please wear clothes that are easy to get on and off and wear slip-on shoes.  Thank you for allowing [place] to care for you!   -- [facility]    Follow-Up: At [facility], you and your health needs are our priority.  As part of our continuing mission to provide you with exceptional heart care, we have created designated Provider Care Teams.  These Care Teams include your primary Cardiologist (physician) and Advanced Practice Providers (APPs -  Physician Assistants and Nurse Practitioners) who all work together to provide you with the care you need, when you need it.  We recommend signing up for the patient portal called "MyChart".  Sign up information is provided on this After Visit Summary.  MyChart is used to connect with patients for Virtual Visits (Telemedicine).  Patients are able to view lab/test results, encounter notes, upcoming appointments, etc.  Non-urgent messages can be sent to your provider as well.   To learn more about what you can do with MyChart, go to [email or web address].    Your next appointment:   1 month(s)  The format for your next appointment:   In Person  Provider:   You may see Kathlyn Sacramento, MD or one of the following Advanced Practice Providers on your designated Care Team:   Murray Hodgkins, NP Christell Faith, PA-C Marrianne Mood, PA-C Cadence Centrahoma, [place] Laurann Montana, NP   Other Instructions

## 2021-05-01 LAB — BASIC METABOLIC PANEL
BUN/Creatinine Ratio: 23 (ref 12–28)
BUN: 23 mg/dL (ref 8–27)
CO2: 21 mmol/L (ref 20–29)
Calcium: 9.6 mg/dL (ref 8.7–10.3)
Chloride: 108 mmol/L — ABNORMAL HIGH (ref 96–106)
Creatinine, Ser: 1.01 mg/dL — ABNORMAL HIGH (ref 0.57–1.00)
Glucose: 121 mg/dL — ABNORMAL HIGH (ref 65–99)
Potassium: 4.5 mmol/L (ref 3.5–5.2)
Sodium: 146 mmol/L — ABNORMAL HIGH (ref 134–144)
eGFR: 58 mL/min/{1.73_m2} — ABNORMAL LOW (ref >59)

## 2021-05-01 LAB — CBC
Hematocrit: 41.6 % (ref 34.0–46.6)
Hemoglobin: 14.1 g/dL (ref 11.1–15.9)
MCH: 31.3 pg (ref 26.6–33.0)
MCHC: 33.9 g/dL (ref 31.5–35.7)
MCV: 92 fL (ref 79–97)
Platelets: 285 10*3/uL (ref 150–450)
RBC: 4.5 x10E6/uL (ref 3.77–5.28)
RDW: 15.2 % (ref 11.7–15.4)
WBC: 7.7 10*3/uL (ref 3.4–10.8)

## 2021-05-01 MED ORDER — SODIUM CHLORIDE 0.9% FLUSH: 3.0000 mL | Freq: Two times a day (BID) | INTRAVENOUS | Status: DC

## 2021-05-06 ENCOUNTER — Encounter
Admission: RE | Disposition: A | Discharge status: Home / Self Care | Payer: Self-pay | Source: Ambulatory Visit | Attending: Cardiovascular Disease

## 2021-05-06 ENCOUNTER — Other Ambulatory Visit: Payer: Self-pay

## 2021-05-06 ENCOUNTER — Ambulatory Visit
Admission: RE | Admit: 2021-05-06 | Discharge: 2021-05-06 | Disposition: A | Discharge status: Home / Self Care | Payer: Medicare Other | Source: Ambulatory Visit | Attending: Cardiovascular Disease | Admitting: Cardiovascular Disease

## 2021-05-06 ENCOUNTER — Encounter: Payer: Self-pay | Admitting: Cardiovascular Disease

## 2021-05-06 DIAGNOSIS — Z8249 Family history of ischemic heart disease and other diseases of the circulatory system: Secondary | ICD-10-CM | POA: Diagnosis not present

## 2021-05-06 DIAGNOSIS — N183 Chronic kidney disease, stage 3 unspecified: Secondary | ICD-10-CM | POA: Diagnosis not present

## 2021-05-06 DIAGNOSIS — E785 Hyperlipidemia, unspecified: Secondary | ICD-10-CM | POA: Insufficient documentation

## 2021-05-06 DIAGNOSIS — K219 Gastro-esophageal reflux disease without esophagitis: Secondary | ICD-10-CM | POA: Diagnosis not present

## 2021-05-06 DIAGNOSIS — I129 Hypertensive chronic kidney disease with stage 1 through stage 4 chronic kidney disease, or unspecified chronic kidney disease: Secondary | ICD-10-CM | POA: Diagnosis not present

## 2021-05-06 DIAGNOSIS — I272 Pulmonary hypertension, unspecified: Secondary | ICD-10-CM

## 2021-05-06 DIAGNOSIS — Z8616 Personal history of COVID-19: Secondary | ICD-10-CM | POA: Insufficient documentation

## 2021-05-06 DIAGNOSIS — Z79899 Other long term (current) drug therapy: Secondary | ICD-10-CM | POA: Diagnosis not present

## 2021-05-06 DIAGNOSIS — I270 Primary pulmonary hypertension: Secondary | ICD-10-CM | POA: Diagnosis not present

## 2021-05-06 DIAGNOSIS — Z888 Allergy status to other drugs, medicaments and biological substances status: Secondary | ICD-10-CM | POA: Insufficient documentation

## 2021-05-06 DIAGNOSIS — I25118 Atherosclerotic heart disease of native coronary artery with other forms of angina pectoris: Secondary | ICD-10-CM

## 2021-05-06 DIAGNOSIS — R9439 Abnormal result of other cardiovascular function study: Secondary | ICD-10-CM | POA: Diagnosis present

## 2021-05-06 DIAGNOSIS — R0602 Shortness of breath: Secondary | ICD-10-CM | POA: Insufficient documentation

## 2021-05-06 DIAGNOSIS — Z885 Allergy status to narcotic agent status: Secondary | ICD-10-CM | POA: Diagnosis not present

## 2021-05-06 DIAGNOSIS — Z79890 Hormone replacement therapy (postmenopausal): Secondary | ICD-10-CM | POA: Insufficient documentation

## 2021-05-06 DIAGNOSIS — Z28310 Unvaccinated for covid-19: Secondary | ICD-10-CM | POA: Insufficient documentation

## 2021-05-06 DIAGNOSIS — Z7982 Long term (current) use of aspirin: Secondary | ICD-10-CM | POA: Diagnosis not present

## 2021-05-06 DIAGNOSIS — Z6841 Body Mass Index (BMI) 40.0 and over, adult: Secondary | ICD-10-CM | POA: Diagnosis not present

## 2021-05-06 DIAGNOSIS — Z87891 Personal history of nicotine dependence: Secondary | ICD-10-CM | POA: Insufficient documentation

## 2021-05-06 HISTORY — PX: RIGHT/LEFT HEART CATH AND CORONARY ANGIOGRAPHY: CATH118266

## 2021-05-06 SURGERY — RIGHT/LEFT HEART CATH AND CORONARY ANGIOGRAPHY
Anesthesia: Moderate Sedation | Laterality: Bilateral

## 2021-05-06 MED ORDER — HEPARIN (PORCINE) IN NACL 2000-0.9 UNIT/L-% IV SOLN
INTRAVENOUS | Status: DC | PRN
  Administered 2021-05-06: 1000 mL

## 2021-05-06 MED ORDER — IOHEXOL 300 MG/ML  SOLN
INTRAMUSCULAR | Status: DC | PRN
  Administered 2021-05-06: 30 mL

## 2021-05-06 MED ORDER — LIDOCAINE HCL (PF) 1 % IJ SOLN
INTRAMUSCULAR | Status: DC | PRN
  Administered 2021-05-06: 5 mL

## 2021-05-06 MED ORDER — MIDAZOLAM HCL 2 MG/2ML IJ SOLN
INTRAMUSCULAR | Status: DC | PRN
  Administered 2021-05-06: 1 mg via INTRAVENOUS

## 2021-05-06 MED ORDER — FENTANYL CITRATE (PF) 100 MCG/2ML IJ SOLN
INTRAMUSCULAR | Status: DC | PRN
  Administered 2021-05-06: 50 ug via INTRAVENOUS

## 2021-05-06 MED ORDER — SODIUM CHLORIDE 0.9 % IV SOLN: 250.0000 mL | INTRAVENOUS | Status: DC | PRN

## 2021-05-06 MED ORDER — SODIUM CHLORIDE 0.9% FLUSH: 3.0000 mL | INTRAVENOUS | Status: DC | PRN

## 2021-05-06 MED ORDER — LIDOCAINE HCL (PF) 1 % IJ SOLN
INTRAMUSCULAR | Status: AC
  Filled 2021-05-06: qty 30

## 2021-05-06 MED ORDER — ACETAMINOPHEN 325 MG PO TABS: 650.0000 mg | ORAL_TABLET | ORAL | Status: DC | PRN

## 2021-05-06 MED ORDER — SODIUM CHLORIDE 0.9% FLUSH: 3.0000 mL | Freq: Two times a day (BID) | INTRAVENOUS | Status: DC

## 2021-05-06 MED ORDER — VERAPAMIL HCL 2.5 MG/ML IV SOLN
INTRAVENOUS | Status: AC
  Filled 2021-05-06: qty 2

## 2021-05-06 MED ORDER — ONDANSETRON HCL 4 MG/2ML IJ SOLN: 4.0000 mg | Freq: Four times a day (QID) | INTRAMUSCULAR | Status: DC | PRN

## 2021-05-06 MED ORDER — FENTANYL CITRATE (PF) 100 MCG/2ML IJ SOLN
INTRAMUSCULAR | Status: AC
  Filled 2021-05-06: qty 2

## 2021-05-06 MED ORDER — VERAPAMIL HCL 2.5 MG/ML IV SOLN
INTRAVENOUS | Status: DC | PRN
  Administered 2021-05-06: 2.5 mg via INTRA_ARTERIAL

## 2021-05-06 MED ORDER — HEPARIN SODIUM (PORCINE) 1000 UNIT/ML IJ SOLN
INTRAMUSCULAR | Status: DC | PRN
  Administered 2021-05-06: 5000 [IU] via INTRAVENOUS

## 2021-05-06 MED ORDER — HEPARIN SODIUM (PORCINE) 1000 UNIT/ML IJ SOLN
INTRAMUSCULAR | Status: AC
  Filled 2021-05-06: qty 1

## 2021-05-06 MED ORDER — ASPIRIN 81 MG PO CHEW
81.0000 mg | CHEWABLE_TABLET | ORAL | Status: DC
Start: 2021-05-07 — End: 2021-05-06

## 2021-05-06 MED ORDER — MIDAZOLAM HCL 2 MG/2ML IJ SOLN
INTRAMUSCULAR | Status: AC
  Filled 2021-05-06: qty 2

## 2021-05-06 MED ORDER — SODIUM CHLORIDE 0.9 % IV SOLN: INTRAVENOUS | Status: DC

## 2021-05-06 SURGICAL SUPPLY — 11 items
CATH INFINITI 5FR JK (CATHETERS) ×2 IMPLANT
CATH SWAN GANZ 7F STRAIGHT (CATHETERS) ×2 IMPLANT
DEVICE RAD TR BAND REGULAR (VASCULAR PRODUCTS) ×2 IMPLANT
DRAPE BRACHIAL (DRAPES) ×4 IMPLANT
GLIDESHEATH SLEND SS 6F .021 (SHEATH) ×2 IMPLANT
GLIDESHEATH SLENDER 7FR .021G (SHEATH) ×2 IMPLANT
PACK CARDIAC CATH (CUSTOM PROCEDURE TRAY) ×2 IMPLANT
PANNUS RETENTION SYSTEM 2 PAD (MISCELLANEOUS) ×2 IMPLANT
SET ATX SIMPLICITY (MISCELLANEOUS) ×2 IMPLANT
SHEATH GLIDE SLENDER 4/5FR (SHEATH) IMPLANT
WIRE HI TORQ VERSACORE J 260CM (WIRE) ×2 IMPLANT

## 2021-05-06 NOTE — Interval H&P Note (Signed)
History and Physical Interval Note:  05/06/2021 10:49 AM  Courtney Roth  has presented today for surgery, with the diagnosis of RT and LT Cath   Abnormal stress test  Pulmonary hypertension.  The various methods of treatment have been discussed with the patient and family. After consideration of risks, benefits and other options for treatment, the patient has consented to  Procedure(s): RIGHT/LEFT HEART CATH AND CORONARY ANGIOGRAPHY (Bilateral) as a surgical intervention.  The patient's history has been reviewed, patient examined, no change in status, stable for surgery.  I have reviewed the patient's chart and labs.  Questions were answered to the patient's satisfaction.     Kathlyn Sacramento

## 2021-05-29 NOTE — Progress Notes (Signed)
Cardiology Office Note:    Date:  06/01/2021   ID:  Courtney Roth, DOB 09/26/1946, MRN [id number]  PCP:  Casilda Carls, MD  [facility] Cardiologist:  Kathlyn Sacramento, MD  [place] Electrophysiologist:  None   Referring MD: Casilda Carls, MD   Chief Complaint: 1 month follow-up  History of Present Illness:    Courtney Roth is a 75 y.o. female with a hx of pulmonary hypertension, abnormal nuclear stress test, hypertension, chronic back pain, morbid obesity, hyperlipidemia, stage III chronic kidney disease, ulcerative colitis and GERD, and previous smoker.  In 2019 patient had shortness of breath and atypical chest pain. She underwent treadmill nuclear stress test but was only able to exercise for 2 minutes and 13 seconds.  She achieved 85% predicted heart rate.There was moderate apical and anteroseptal defect which was reversible and suggestive of ischemia.  The patient declined cardiac cath at that time.  Echocardiogram in 2019 showed EF 50 to 40%, grade 1 diastolic dysfunction, mild MR, pulmonary pressures cannot be estimated.  Started on metoprolol for sinus tachycardia.  In January 2022 she had COVID. CT of the lung showed coronary artery and aortic calcifications.  Echo in March 2022 exertional dyspnea showed EF 50 to 98%, grade 1 diastolic dysfunction, severe pulmonary hypertension with estimated pulmonary systolic pressure 67 mmHg, mild to moderate tricuspid regurgitation and normal-sized IVC.  Patient was last seen 04/30/2021 and reported significant shortness of breath. She was set up for right and left heart cath. Left heart cath showed normal coronary arteries.  Right heart cath showed normal filling pressures, mild pulmonary hypertension and moderately reduced cardiac output. Like most of her shortness of breath coming from the lungs.  Today, the patient reports she has been fine since procedure. No questions regarding the procedure. Reports palpations that  have been on and off for the last year. Unsure how many episodes in a week or day. Palpitations do not disrupt normal activity. No triggers. Episode this morning, lasted a couple seconds. May be up to a couple minutes. Feels heart beat, not racing. No chest pain or sob, lightheadedness or dizziness. Rare caffeine use. No alcohol use, no tobacco use. No recent fever or chills Right sided back pain started around the procedure. Worse when she lays on the stomach or right side, or raise the right arm. No known injuries. Not worse with exertion. No LLE. No chest pain, breathing is the same. No LLE, pnd, cannot lay on her right side.   Past Medical History:  Diagnosis Date   Colon polyps 03/04/2007   History of Hyperplastic   COVID-19 12/2020   HAD COVID FOR 10 WEEKS; stayed at home entire time   Dyspnea    GERD (gastroesophageal reflux disease)    Hyperlipidemia    Hypertension    Obesity    Pneumonia    Stress reaction    Ulcerative colitis     Past Surgical History:  Procedure Laterality Date   COLONOSCOPY WITH PROPOFOL N/A 11/19/2018   Procedure: COLONOSCOPY WITH PROPOFOL;  Surgeon: Virgel Manifold, MD;  Location: [facility] ENDOSCOPY;  Service: Endoscopy;  Laterality: N/A;   RIGHT/LEFT HEART CATH AND CORONARY ANGIOGRAPHY Bilateral 05/06/2021   Procedure: RIGHT/LEFT HEART CATH AND CORONARY ANGIOGRAPHY;  Surgeon: Wellington Hampshire, MD;  Location: [facility];  Service: Cardiovascular;  Laterality: Bilateral;   ruptured tubal pregnancy  1975   1 ovary removed    Current Medications: Current Meds  Medication Sig   acetaminophen (TYLENOL)  650 MG CR tablet Take 650 mg by mouth every 8 (eight) hours as needed (Arthritis Pain).   aspirin 81 MG tablet Take 81 mg by mouth daily.   celecoxib (CELEBREX) 200 MG capsule Take 200 mg by mouth daily as needed (Arthritis pain).   hydrochlorothiazide (HYDRODIURIL) 25 MG tablet Take 12.5 mg by mouth daily.   levothyroxine (SYNTHROID) 50 MCG  tablet Take 50 mcg by mouth daily before breakfast.   losartan (COZAAR) 25 MG tablet Take 50 mg by mouth daily.   metoprolol tartrate (LOPRESSOR) 25 MG tablet Take 25 mg by mouth daily at 8 pm.   Pirfenidone (ESBRIET) 267 MG CAPS Take 801 mg by mouth 3 (three) times daily.   rosuvastatin (CRESTOR) 20 MG tablet Take 1 tablet (20 mg total) by mouth daily.   sulfaSALAzine (AZULFIDINE) 500 MG tablet Take 2 tablets (1,000 mg total) by mouth 2 (two) times daily.   traZODone (DESYREL) 50 MG tablet Take 50 mg by mouth at bedtime as needed for sleep.   Current Facility-Administered Medications for the 05/31/21 encounter (Office Visit) with Kathlen Mody, Lilianah Buffin H, PA-C  Medication   sodium chloride flush (NS) 0.9 % injection 3 mL     Allergies:   Codeine, Metoprolol, Metoprolol succinate, and Other   Social History   Socioeconomic History   Marital status: Significant Other    Spouse name: Laurey Arrow   Number of children: 1   Years of education: Not on file   Highest education level: Not on file  Occupational History   Not on file  Tobacco Use   Smoking status: Former    Packs/day: 1.00    Years: 30.00    Pack years: 30.00    Types: Cigarettes    Quit date: 10/27/2006    Years since quitting: 14.6   Smokeless tobacco: Never  Vaping Use   Vaping Use: Never used  Substance and Sexual Activity   Alcohol use: Yes    Alcohol/week: 0.0 standard drinks    Comment: rare   Drug use: No   Sexual activity: Not on file  Other Topics Concern   Not on file  Social History Narrative   Lives at home with Laurey Arrow [facility]) : has 1 son not living with pt.    Social Determinants of Health   Financial Resource Strain: Not on file  Food Insecurity: Not on file  Transportation Needs: Not on file  Physical Activity: Not on file  Stress: Not on file  Social Connections: Not on file     Family History: The patient's family history includes Breast cancer in her cousin and maternal aunt; Clotting disorder in her  mother; Crohn's disease in her sister; Diabetes in her paternal uncle; GER disease in her mother; Heart disease in her father and mother; Hypertension in her mother; Osteoporosis in her mother. There is no history of Colon cancer.  ROS:   Please see the history of present illness.     All other systems reviewed and are negative.  EKGs/Labs/Other Studies Reviewed:    The following studies were reviewed today:  Right and left cardiac cath 05/06/2021 1.  Normal coronary arteries. 2.  Left ventricular angiography was not performed.  EF was normal by echo. 3.  Right heart catheterization showed normal filling pressures, mild pulmonary hypertension and moderately reduced cardiac output.   Recommendations: The patient appears to be euvolemic.   Pulmonary hypertension seems to be better than what the echo suggested. Suspect that most of her symptoms of shortness of  breath are related to her lungs and not cardiac.  Echo 01/17/2021 1. Left ventricular ejection fraction, by estimation, is 50 to 55%. The  left ventricle has low normal function. Left ventricular endocardial  border not optimally defined to evaluate regional wall motion. There is  mild left ventricular hypertrophy. Left  ventricular diastolic parameters are consistent with Grade I diastolic  dysfunction (impaired relaxation).   2. Right ventricular systolic function is low normal. The right  ventricular size is normal. There is severely elevated pulmonary artery  systolic pressure. The estimated right ventricular systolic pressure is  22.2 mmHg.   3. The mitral valve is normal in structure. No evidence of mitral valve  regurgitation.   4. Tricuspid valve regurgitation is mild to moderate.   5. The aortic valve is tricuspid. Aortic valve regurgitation is not  visualized. Mild aortic valve sclerosis is present, with no evidence of  aortic valve stenosis.   6. The inferior vena cava is normal in size with greater than 50%   respiratory variability, suggesting right atrial pressure of 3 mmHg.   EKG:  EKG is  ordered today.  The ekg ordered today demonstrates NSR 60bpm, low voltage, nonspecific ST/T wave changes.   Recent Labs: 05/31/2021: BUN 23; Creatinine, Ser 0.88; Hemoglobin 13.3; Magnesium 2.1; Platelets 275; Potassium 4.5; Sodium 140; TSH 2.980  Recent Lipid Panel    Component Value Date/Time   CHOL 197 10/18/2019 1515   TRIG 188 (H) 10/18/2019 1515   HDL 45 10/18/2019 1515   CHOLHDL 4.4 10/18/2019 1515   VLDL 38 10/18/2019 1515   LDLCALC 114 (H) 10/18/2019 1515   LDLDIRECT 120.4 (H) 10/18/2019 1515    Physical Exam:    VS:  BP 130/70 (BP Location: Right Arm, Patient Position: Sitting, Cuff Size: Normal) Comment: End of visit  Pulse 83   Ht 5' (1.524 m)   Wt 237 lb 4 oz (107.6 kg)   SpO2 98%   BMI 46.33 kg/m     Wt Readings from Last 3 Encounters:  05/31/21 237 lb 4 oz (107.6 kg)  05/06/21 234 lb (106.1 kg)  04/30/21 234 lb 8 oz (106.4 kg)     GEN:  Well nourished, well developed in no acute distress HEENT: Normal NECK: No JVD; No carotid bruits LYMPHATICS: No lymphadenopathy CARDIAC: RRR, no murmurs, rubs, gallops RESPIRATORY:  Crackles right lower lobe ABDOMEN: Soft, non-tender, non-distended MUSCULOSKELETAL:  No edema; No deformity  SKIN: Warm and dry NEUROLOGIC:  Alert and oriented x 3 PSYCHIATRIC:  Normal affect   ASSESSMENT:    1. Palpitations   2. Coronary artery disease involving native coronary artery of native heart with other form of angina pectoris ([place])   3. Essential hypertension   4. Pleural pain   5. Mid back pain on right side   6. Hyperlipidemia LDL goal <70   7. Stage 3 chronic kidney disease, unspecified whether stage 3a or 3b CKD ([place])   8. SOB (shortness of breath)   9. Pulmonary hypertension (HCC)    PLAN:    In order of problems listed above:  Coronary atherosclerosis by CT H/o abnormal stress test Patient had an abnormal stress test in 2019  and deferred cath at that time. She recently underwent R/L cardiac cath for dyspnea. Cath showed normal coronaries. Right heart cath showed normal filling pressures, and mild PH. Echo 12/2020 showed LVEF 50-55%, mild LVH, G1DD. Right wrist  cath site appears stable with no complications. Patient denies chest pain pain. Breathing is the  same. Recent cardiac cath showed normal coronaries. Continue Aspirin, statin, Metoprolol.   Hypertension BP mildly elevated today, however this is not normal for the patient. Continue current medications for now and reevaluate at follow-up.   Hyperlipidemia LDL 114 in 09/2019. Goal <70. Continue Crestor 64m daily. Needs repeat lipid panel at follow-up   Right sided pleuritic/back pain Pulmonary fibrosis Mild pulmonary HTN Recent RHC showed mild PH. She has followed th pulmonology for pulmonary fibrosis after COVID 19-infection 10/2020. She reports 1 month of consistent right sided back pain. Seems worse with position. Does not sound cardiac in nature. She has complicated lung history. She has crackles at the right base. She denies overt fever or chills. I will order a CXR and recommend follow-up with PCP or pulmonologist. CBC today.  Palpitations Patient reports intermittent palpitations with no known triggers. They are brief episodes with no associated symptoms. Check BMET, Mag, TSH, CBC. I will order a 2 week heart monitor  CDK stage 3 Stable on most recent labs. Followed by nephrology.   Disposition: Follow up in 1-2 month(s) with MD/APP   Signed, Oluwadamilola Deliz HNinfa Meeker PA-C  06/01/2021 1:44 PM    [facility]

## 2021-05-31 ENCOUNTER — Ambulatory Visit: Payer: Medicare Other

## 2021-05-31 ENCOUNTER — Other Ambulatory Visit: Payer: Self-pay

## 2021-05-31 ENCOUNTER — Other Ambulatory Visit
Admission: RE | Admit: 2021-05-31 | Discharge: 2021-05-31 | Disposition: A | Discharge status: Home / Self Care | Payer: Medicare Other | Source: Home / Self Care | Attending: Medical | Admitting: Medical

## 2021-05-31 ENCOUNTER — Encounter: Payer: Self-pay | Admitting: Medical

## 2021-05-31 ENCOUNTER — Ambulatory Visit (INDEPENDENT_AMBULATORY_CARE_PROVIDER_SITE_OTHER): Payer: Medicare Other | Admitting: Medical

## 2021-05-31 ENCOUNTER — Ambulatory Visit
Admission: RE | Admit: 2021-05-31 | Discharge: 2021-05-31 | Disposition: A | Discharge status: Home / Self Care | Payer: Medicare Other | Source: Ambulatory Visit | Attending: Medical | Admitting: Medical

## 2021-05-31 ENCOUNTER — Ambulatory Visit
Admission: RE | Admit: 2021-05-31 | Discharge: 2021-05-31 | Disposition: A | Discharge status: Home / Self Care | Payer: Medicare Other | Attending: Medical | Admitting: Medical

## 2021-05-31 VITALS — BP 130/70 | HR 83 | Ht 60.0 in | Wt 237.2 lb

## 2021-05-31 DIAGNOSIS — R002 Palpitations: Secondary | ICD-10-CM

## 2021-05-31 DIAGNOSIS — R0781 Pleurodynia: Secondary | ICD-10-CM

## 2021-05-31 DIAGNOSIS — I10 Essential (primary) hypertension: Secondary | ICD-10-CM

## 2021-05-31 DIAGNOSIS — M549 Dorsalgia, unspecified: Secondary | ICD-10-CM

## 2021-05-31 DIAGNOSIS — I25118 Atherosclerotic heart disease of native coronary artery with other forms of angina pectoris: Secondary | ICD-10-CM

## 2021-05-31 DIAGNOSIS — N183 Chronic kidney disease, stage 3 unspecified: Secondary | ICD-10-CM

## 2021-05-31 DIAGNOSIS — I272 Pulmonary hypertension, unspecified: Secondary | ICD-10-CM

## 2021-05-31 DIAGNOSIS — E785 Hyperlipidemia, unspecified: Secondary | ICD-10-CM

## 2021-05-31 DIAGNOSIS — R0602 Shortness of breath: Secondary | ICD-10-CM

## 2021-05-31 LAB — BASIC METABOLIC PANEL
Anion gap: 7 (ref 5–15)
BUN: 23 mg/dL (ref 8–23)
CO2: 27 mmol/L (ref 22–32)
Calcium: 9.2 mg/dL (ref 8.9–10.3)
Chloride: 106 mmol/L (ref 98–111)
Creatinine, Ser: 0.88 mg/dL (ref 0.44–1.00)
GFR, Estimated: >60 mL/min (ref >60)
Glucose, Bld: 114 mg/dL — ABNORMAL HIGH (ref 70–99)
Potassium: 4.5 mmol/L (ref 3.5–5.1)
Sodium: 140 mmol/L (ref 135–145)

## 2021-05-31 LAB — CBC
HCT: 40.5 % (ref 36.0–46.0)
Hemoglobin: 13.3 g/dL (ref 12.0–15.0)
MCH: 33.3 pg (ref 26.0–34.0)
MCHC: 32.8 g/dL (ref 30.0–36.0)
MCV: 101.5 fL — ABNORMAL HIGH (ref 80.0–100.0)
Platelets: 275 10*3/uL (ref 150–400)
RBC: 3.99 MIL/uL (ref 3.87–5.11)
RDW: 15.5 % (ref 11.5–15.5)
WBC: 8.1 10*3/uL (ref 4.0–10.5)
nRBC: 0 % (ref 0.0–0.2)

## 2021-05-31 LAB — MAGNESIUM: Magnesium: 2.1 mg/dL (ref 1.7–2.4)

## 2021-05-31 LAB — TSH: TSH: 2.98 u[IU]/mL (ref 0.350–4.500)

## 2021-05-31 NOTE — Patient Instructions (Signed)
Medication Instructions:   Your physician recommends that you continue on your current medications as directed. Please refer to the Current Medication list given to you today.  *If you need a refill on your cardiac medications before your next appointment, please call your pharmacy*   Lab Work:  Your physician recommends that you have labs TODAY at the [facility]: CBC, Magnesium, TSH, BMET  -  Please go to the [facility]. You will check in at the front desk to the right as you walk into the atrium.   Testing/Procedures:  1) Your physician recommends that you have a Chest Xray today at the [facility].  Please go to the [facility]. You will check in at the front desk to the right as you walk into the atrium.   2) Your physician has recommended that you wear a Zio XT monitor for TWO WEEKS. This will be mailed to you.  This monitor is a medical device that records the heart's electrical activity. Doctors most often use these monitors to diagnose arrhythmias. Arrhythmias are problems with the speed or rhythm of the heartbeat. The monitor is a small device applied to your chest. You can wear one while you do your normal daily activities. While wearing this monitor if you have any symptoms to push the button and record what you felt. Once you have worn this monitor for the period of time provider prescribed (Usually 14 days), you will return the monitor device in the postage paid box. Once it is returned they will download the data collected and provide [place] with a report which the provider will then review and we will call you with those results. Important tips:  Avoid showering during the first 24 hours of wearing the monitor. Avoid excessive sweating to help maximize wear time. Do not submerge the device, no hot tubs, and no swimming pools. Keep any lotions or oils away from the patch. After 24 hours you may shower with the patch on. Take brief showers with your back facing the  shower head.  Do not remove patch once it has been placed because that will interrupt data and decrease adhesive wear time. Push the button when you have any symptoms and write down what you were feeling. Once you have completed wearing your monitor, remove and place into box which has postage paid and place in your outgoing mailbox.  If for some reason you have misplaced your box then call our office and we can provide another box and/or mail it off for you.     Follow-Up: At [facility], you and your health needs are our priority.  As part of our continuing mission to provide you with exceptional heart care, we have created designated Provider Care Teams.  These Care Teams include your primary Cardiologist (physician) and Advanced Practice Providers (APPs -  Physician Assistants and Nurse Practitioners) who all work together to provide you with the care you need, when you need it.  We recommend signing up for the patient portal called "MyChart".  Sign up information is provided on this After Visit Summary.  MyChart is used to connect with patients for Virtual Visits (Telemedicine).  Patients are able to view lab/test results, encounter notes, upcoming appointments, etc.  Non-urgent messages can be sent to your provider as well.   To learn more about what you can do with MyChart, go to [email or web address].    Your next appointment:   1 - 2 month(s)  The format for your  next appointment:   In Person  Provider:   You may see Kathlyn Sacramento, MD or one of the following Advanced Practice Providers on your designated Care Team:   Murray Hodgkins, NP Christell Faith, PA-C Marrianne Mood, PA-C Cadence [place], [place]

## 2021-07-23 ENCOUNTER — Ambulatory Visit: Payer: Medicare Other | Admitting: Cardiovascular Disease

## 2021-08-01 ENCOUNTER — Other Ambulatory Visit: Payer: Self-pay

## 2021-08-01 ENCOUNTER — Encounter: Payer: Self-pay | Admitting: Cardiovascular Disease

## 2021-08-01 ENCOUNTER — Ambulatory Visit (INDEPENDENT_AMBULATORY_CARE_PROVIDER_SITE_OTHER): Payer: Medicare Other | Admitting: Cardiovascular Disease

## 2021-08-01 VITALS — BP 138/70 | HR 68 | Ht 60.0 in | Wt 239.5 lb

## 2021-08-01 DIAGNOSIS — I272 Pulmonary hypertension, unspecified: Secondary | ICD-10-CM | POA: Diagnosis not present

## 2021-08-01 DIAGNOSIS — I10 Essential (primary) hypertension: Secondary | ICD-10-CM | POA: Diagnosis not present

## 2021-08-01 DIAGNOSIS — R002 Palpitations: Secondary | ICD-10-CM

## 2021-08-01 DIAGNOSIS — I259 Chronic ischemic heart disease, unspecified: Secondary | ICD-10-CM

## 2021-08-01 DIAGNOSIS — E785 Hyperlipidemia, unspecified: Secondary | ICD-10-CM | POA: Diagnosis not present

## 2021-08-01 NOTE — Progress Notes (Signed)
Cardiology Office Note   Date:  08/01/2021   ID:  Courtney Roth Feb 23, 1946, MRN [id number]  PCP:  Casilda Carls, MD  Cardiologist:   Kathlyn Sacramento, MD   Chief Complaint  Patient presents with   Other    Post chest xray and pt mentioned that she didn't wear the monitor. Pt forgot to have monitor placed. Meds reviewed verbally with pt.       History of Present Illness: Courtney Roth is a 75 y.o. female who is here today for follow-up visit regarding regarding pulmonary hypertension.  She has chronic medical conditions that include hypertension, chronic back pain, morbid obesity, hyperlipidemia, stage III chronic kidney disease, essential hypertension ulcerative colitis and GERD. She is a previous smoker and quit about 20 years ago. She had an injury to her back in 2006 .  She had shortness of breath and atypical chest pain in 2019. She underwent a treadmill nuclear stress test in 2019 with Dr.Jadali.  She was able to exercise for only 2 minutes and 13 seconds.  She achieved 85% maximal predicted heart rate.  There was moderate apical and anteroseptal defect which was reversible and suggestive of ischemia.   Echocardiogram in 2019 showed an EF of 50 to 55% with grade 1 diastolic dysfunction and mild mitral regurgitation.  Pulmonary pressure could not be estimated.  She had sinus tachycardia that improved with addition of metoprolol.  She was sick in January with Covid-like symptoms. She was not tested but continued to have symptoms of cough and shortness of breath for about 6 weeks. Since then, her breathing did not go back to baseline. She is not vaccinated for COVID-19. She was referred for CT scan of the lungs which showed multiple areas of focal densities consistent with resolving infectious/inflammatory process. In addition, she was found to have coronary artery and aortic calcifications. She was seen by pulmonary and had COVID-19 antibodies tested which came back  positive.  Echocardiogram was repeated in March 2022 given worsening exertional dyspnea.  It showed an EF of 50 to 56%, grade 1 diastolic dysfunction, severe pulmonary hypertension with estimated pulmonary systolic pressure of 67 mmHg, mild to moderate tricuspid regurgitation and normal size IVC.  Given continued exertional dyspnea in order to evaluate her pulmonary hypertension, she underwent a right and left cardiac catheterization in July of this year which showed normal coronary arteries.  Right heart catheterization showed normal filling pressures, mild pulmonary hypertension and moderately reduced cardiac output. She followed up in August and complained of increased palpitation.  Thus, a ZIO monitor was requested.  She received a monitor but has not applied it yet.  She reports stable symptoms overall with continued daily palpitations but no tachycardia.  Shortness of breath is stable and denies any chest pain.  Past Medical History:  Diagnosis Date   Colon polyps 03/04/2007   History of Hyperplastic   COVID-19 12/2020   HAD COVID FOR 10 WEEKS; stayed at home entire time   Dyspnea    GERD (gastroesophageal reflux disease)    Hyperlipidemia    Hypertension    Obesity    Pneumonia    Stress reaction    Ulcerative colitis     Past Surgical History:  Procedure Laterality Date   COLONOSCOPY WITH PROPOFOL N/A 11/19/2018   Procedure: COLONOSCOPY WITH PROPOFOL;  Surgeon: Virgel Manifold, MD;  Location: [facility] ENDOSCOPY;  Service: Endoscopy;  Laterality: N/A;   RIGHT/LEFT HEART CATH AND CORONARY ANGIOGRAPHY Bilateral 05/06/2021  Procedure: RIGHT/LEFT HEART CATH AND CORONARY ANGIOGRAPHY;  Surgeon: Wellington Hampshire, MD;  Location: [place] [facility];  Service: Cardiovascular;  Laterality: Bilateral;   ruptured tubal pregnancy  1975   1 ovary removed     Current Outpatient Medications  Medication Sig Dispense Refill   acetaminophen (TYLENOL) 650 MG CR tablet Take 650 mg by mouth  every 8 (eight) hours as needed (Arthritis Pain).     aspirin 81 MG tablet Take 81 mg by mouth daily.     hydrochlorothiazide (HYDRODIURIL) 25 MG tablet Take 12.5 mg by mouth daily.     levothyroxine (SYNTHROID) 50 MCG tablet Take 50 mcg by mouth daily before breakfast.     losartan (COZAAR) 25 MG tablet Take 50 mg by mouth daily.     metoprolol tartrate (LOPRESSOR) 25 MG tablet Take 25 mg by mouth daily at 8 pm.     Pirfenidone (ESBRIET) 267 MG CAPS Take 801 mg by mouth 3 (three) times daily.     sulfaSALAzine (AZULFIDINE) 500 MG tablet Take 2 tablets (1,000 mg total) by mouth 2 (two) times daily. 120 tablet 3   traZODone (DESYREL) 50 MG tablet Take 50 mg by mouth at bedtime as needed for sleep.     rosuvastatin (CRESTOR) 20 MG tablet Take 1 tablet (20 mg total) by mouth daily. 90 tablet 3   Current Facility-Administered Medications  Medication Dose Route Frequency Provider Last Rate Last Admin   sodium chloride flush (NS) 0.9 % injection 3 mL  3 mL Intravenous Q12H Amadi Frady A, MD        Allergies:   Codeine, Metoprolol, Metoprolol succinate, and Other    Social History:  The patient  reports that she quit smoking about 14 years ago. Her smoking use included cigarettes. She has a 30.00 pack-year smoking history. She has never used smokeless tobacco. She reports current alcohol use. She reports that she does not use drugs.   Family History:  The patient's family history includes Breast cancer in her cousin and maternal aunt; Clotting disorder in her mother; Crohn's disease in her sister; Diabetes in her paternal uncle; GER disease in her mother; Heart disease in her father and mother; Hypertension in her mother; Osteoporosis in her mother.    ROS:  Please see the history of present illness.   Otherwise, review of systems are positive for none.   All other systems are reviewed and negative.    PHYSICAL EXAM: VS:  BP 138/70 (BP Location: Left Arm, Patient Position: Sitting, Cuff  Size: Large)   Pulse 68   Ht 5' (1.524 m)   Wt 239 lb 8 oz (108.6 kg)   SpO2 96%   BMI 46.77 kg/m  , BMI Body mass index is 46.77 kg/m. GEN: Well nourished, well developed, in no acute distress  HEENT: normal  Neck: no JVD, carotid bruits, or masses Cardiac: RRR; no murmurs, rubs, or gallops,no edema  Respiratory:  clear to auscultation bilaterally, normal work of breathing GI: soft, nontender, nondistended, + BS MS: no deformity or atrophy  Skin: warm and dry, no rash Neuro:  Strength and sensation are intact Psych: euthymic mood, full affect   EKG:  EKG is ordered today. EKG showed normal sinus rhythm with low voltage   Recent Labs: 05/31/2021: BUN 23; Creatinine, Ser 0.88; Hemoglobin 13.3; Magnesium 2.1; Platelets 275; Potassium 4.5; Sodium 140; TSH 2.980    Lipid Panel    Component Value Date/Time   CHOL 197 10/18/2019 1515   TRIG  188 (H) 10/18/2019 1515   HDL 45 10/18/2019 1515   CHOLHDL 4.4 10/18/2019 1515   VLDL 38 10/18/2019 1515   LDLCALC 114 (H) 10/18/2019 1515   LDLDIRECT 120.4 (H) 10/18/2019 1515      Wt Readings from Last 3 Encounters:  08/01/21 239 lb 8 oz (108.6 kg)  05/31/21 237 lb 4 oz (107.6 kg)  05/06/21 234 lb (106.1 kg)       PAD Screen 06/22/2018  Previous PAD dx? No  Previous surgical procedure? No  Pain with walking? No  Feet/toe relief with dangling? No  Painful, non-healing ulcers? No  Extremities discolored? No      ASSESSMENT AND PLAN:  1.  Palpitations: I advised her to apply the ZIO monitor which she has at home in order to check for arrhythmia.  I do not suspect malignant arrhythmia given normal LV systolic function and no significant coronary artery disease.  2.  Pulmonary hypertension: This was severe by echo but mild by right heart catheterization.  No treatment of this is required at this time.   3  Essential hypertension: Blood pressure is controlled on current medications.   4.   Hyperlipidemia: Continue  rosuvastatin 20 mg daily.  5.  Abnormal EKG with low voltage.  Mild LVH on echo.  Low voltage could be due to obesity.  No significant clinical suspicion for cardiac amyloidosis but will monitor for clinical symptoms.     Disposition:   FU with me in 6 months  Signed,  Kathlyn Sacramento, MD  08/01/2021 10:07 AM    [place]

## 2021-08-01 NOTE — Patient Instructions (Signed)
Medication Instructions:  Your physician recommends that you continue on your current medications as directed. Please refer to the Current Medication list given to you today.  *If you need a refill on your cardiac medications before your next appointment, please call your pharmacy*   Lab Work: None ordered If you have labs (blood work) drawn today and your tests are completely normal, you will receive your results only by: [facility] (if you have MyChart) OR A paper copy in the mail If you have any lab test that is abnormal or we need to change your treatment, we will call you to review the results.   Testing/Procedures: Please go ahead and apply the zio XT monitor you have at home. It is to be worn for 14 days. Follow the previously given instruction on applying and returning the monitor.    Follow-Up: At [facility], you and your health needs are our priority.  As part of our continuing mission to provide you with exceptional heart care, we have created designated Provider Care Teams.  These Care Teams include your primary Cardiologist (physician) and Advanced Practice Providers (APPs -  Physician Assistants and Nurse Practitioners) who all work together to provide you with the care you need, when you need it.  We recommend signing up for the patient portal called "MyChart".  Sign up information is provided on this After Visit Summary.  MyChart is used to connect with patients for Virtual Visits (Telemedicine).  Patients are able to view lab/test results, encounter notes, upcoming appointments, etc.  Non-urgent messages can be sent to your provider as well.   To learn more about what you can do with MyChart, go to [email or web address].    Your next appointment:   Your physician wants you to follow-up in: 6 months You will receive a reminder letter in the mail two months in advance. If you don't receive a letter, please call our office to schedule the follow-up  appointment.   The format for your next appointment:   In Person  Provider:   You may see Kathlyn Sacramento, MD or one of the following Advanced Practice Providers on your designated Care Team:   Murray Hodgkins, NP Christell Faith, PA-C Marrianne Mood, PA-C Cadence Kathlen Mody, [place]   Other Instructions N/A

## 2021-08-06 NOTE — Addendum Note (Signed)
Addended by: Britt Bottom on: 08/06/2021 04:12 PM   Modules accepted: Orders

## 2021-08-17 ENCOUNTER — Other Ambulatory Visit: Payer: Self-pay | Admitting: Cardiovascular Disease

## 2021-08-19 NOTE — Telephone Encounter (Signed)
Please advise if ok to refill Historical medication.

## 2021-08-21 IMAGING — CR DG CHEST 2V
1 series · 2 of 2 positions shown · non-contrast
Comparison: Chest radiograph dated 10/18/2009.

CLINICAL DATA: Right-sided pleural effusion and back pain.

EXAM:
CHEST - 2 VIEW

[Series 1: dg chest 2 view · 0.14mm/px · 2 of 2 slices shown]
[im 1/2]
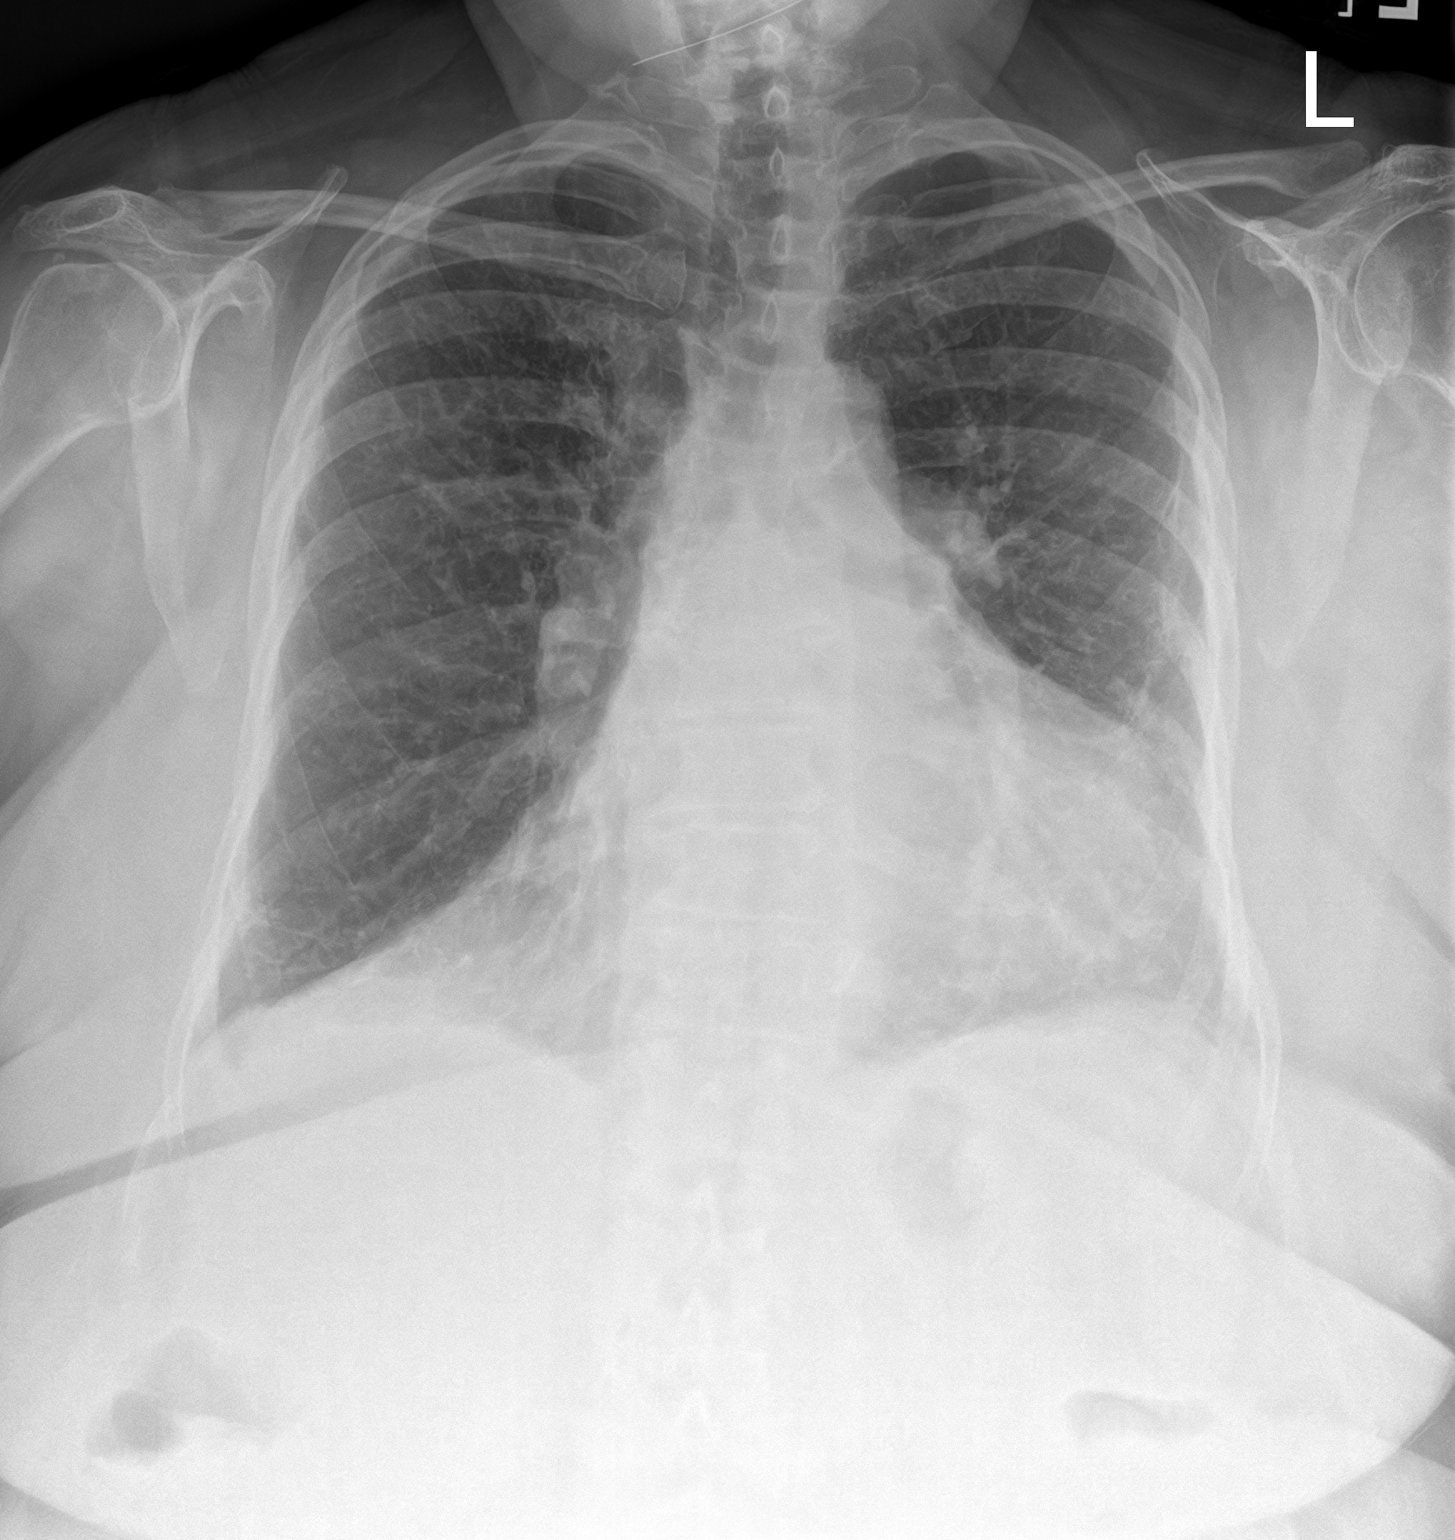
[im 2/2]
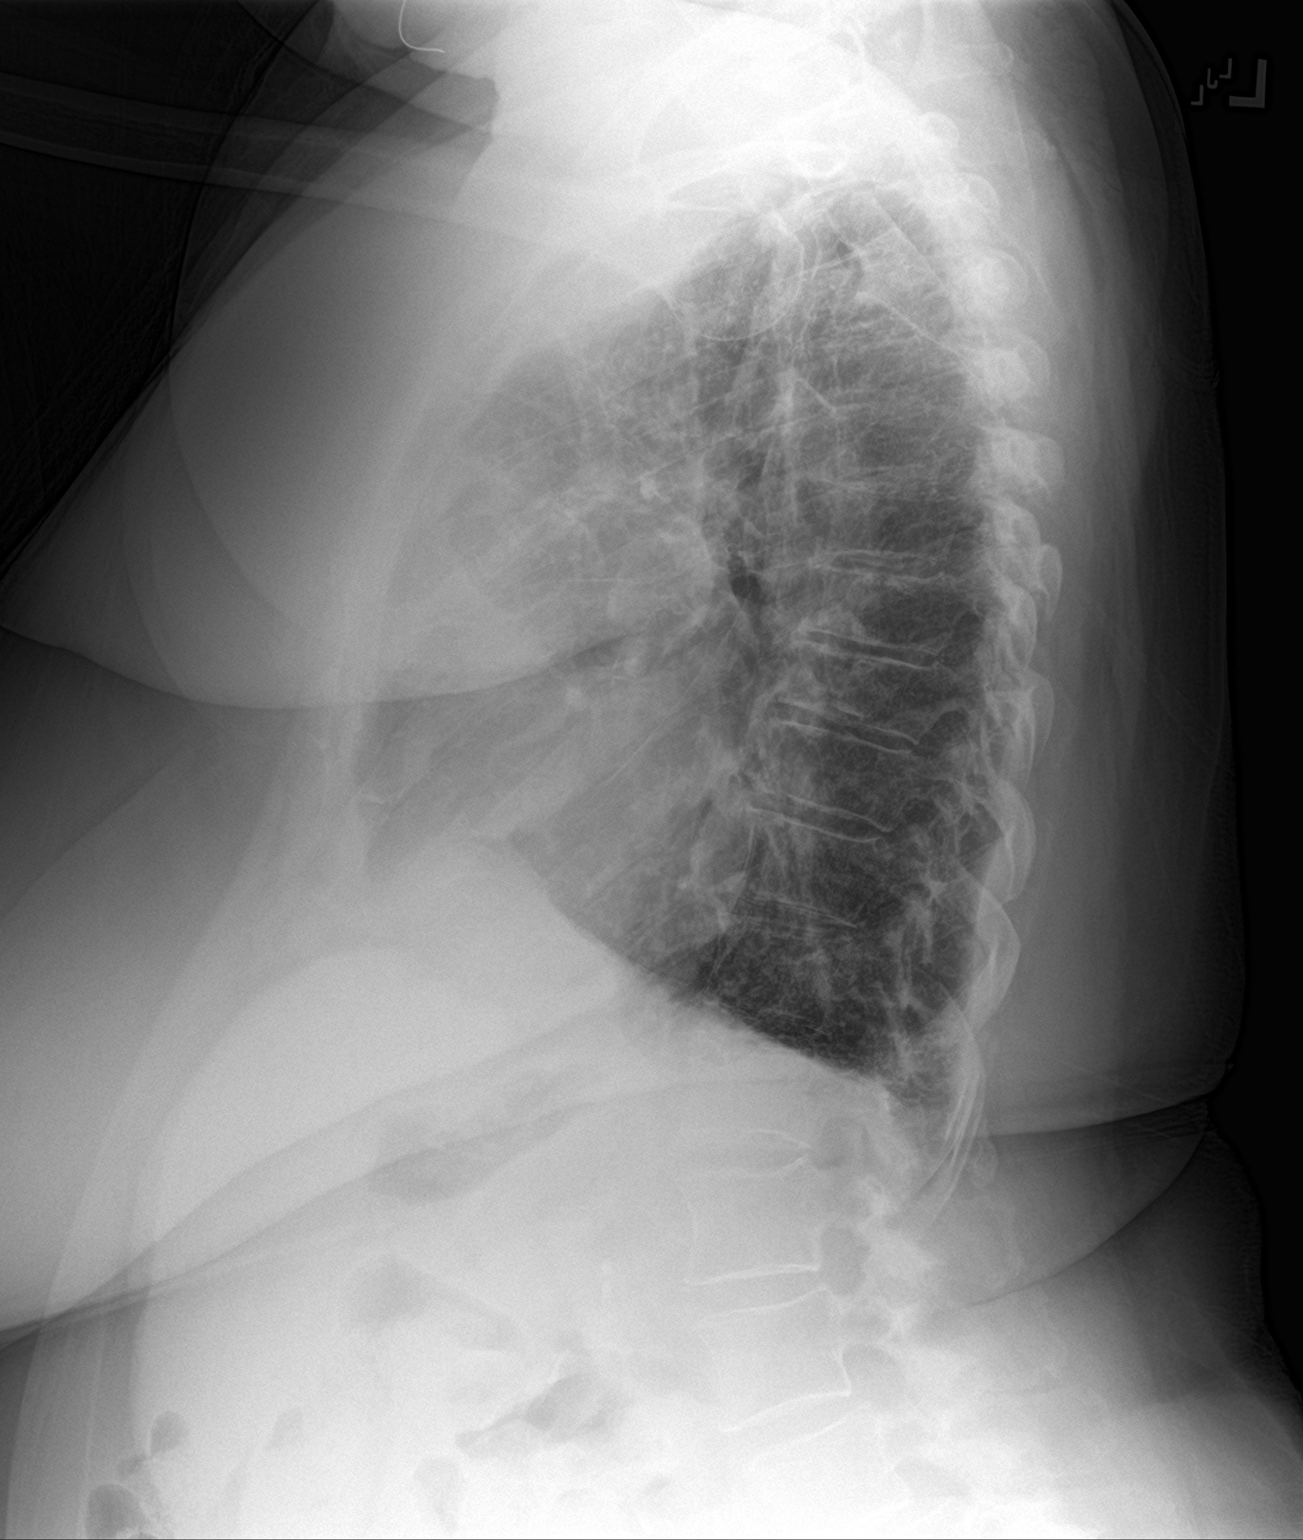

[2 of 2 positions shown; findings below may reference images not displayed]

FINDINGS: There is an area of consolidative appearance involving the medial
right lung base which may represent atelectasis, or infiltrate.
Chronic streaky densities primarily involving the subpleural left
lung. No pleural effusion pneumothorax. Stable cardiac silhouette.
No acute osseous pathology.
IMPRESSION: Right lung base atelectasis, or infiltrate. No large pleural
effusion.

## 2021-09-23 ENCOUNTER — Other Ambulatory Visit: Payer: Self-pay | Admitting: Nephrology

## 2022-05-15 ENCOUNTER — Other Ambulatory Visit: Payer: Self-pay | Admitting: Cardiovascular Disease

## 2022-05-20 ENCOUNTER — Ambulatory Visit: Payer: Medicare Other | Admitting: Medical

## 2022-06-24 ENCOUNTER — Ambulatory Visit: Payer: Medicare Other | Admitting: Cardiovascular Disease

## 2023-07-08 DIAGNOSIS — I890 Lymphedema, not elsewhere classified: Secondary | ICD-10-CM | POA: Insufficient documentation

## 2023-07-08 NOTE — Progress Notes (Signed)
MRN : [id number]  Courtney Roth is a 77 y.o. (10-02-1946) female who presents with chief complaint of legs swell.  History of Present Illness:   Patient is seen for evaluation of leg swelling. The patient first noticed the swelling remotely but is now concerned because of a significant increase in the overall edema. The swelling isn't associated with significant pain.  There has been an increasing amount of  discoloration noted by the patient. The patient notes that in the morning the legs are improved but they steadily worsened throughout the course of the day. Elevation seems to make the swelling of the legs better, dependency makes them much worse.   There is no history of ulcerations associated with the swelling.   The patient denies any recent changes in their medications.  The patient has not been wearing graduated compression.  The patient has no had any past angiography, interventions or vascular surgery.  The patient denies a history of DVT or PE. There is no prior history of phlebitis. There is no history of primary lymphedema.  There is no history of radiation treatment to the groin or pelvis No history of malignancies. No history of trauma or groin or pelvic surgery. No history of foreign travel or parasitic infections area   No outpatient medications have been marked as taking for the 07/09/23 encounter (Appointment) with Gilda Crease, Latina Craver, MD.   Current Facility-Administered Medications for the 07/09/23 encounter (Appointment) with Gilda Crease, Latina Craver, MD  Medication   sodium chloride flush (NS) 0.9 % injection 3 mL    Past Medical History:  Diagnosis Date   Colon polyps 03/04/2007   History of Hyperplastic   COVID-19 12/2020   HAD COVID FOR 10 WEEKS; stayed at home entire time   Dyspnea    GERD (gastroesophageal reflux disease)    Hyperlipidemia    Hypertension    Obesity    Pneumonia    Stress reaction    Ulcerative  colitis     Past Surgical History:  Procedure Laterality Date   COLONOSCOPY WITH PROPOFOL N/A 11/19/2018   Procedure: COLONOSCOPY WITH PROPOFOL;  Surgeon: Pasty Spillers, MD;  Location: [facility] ENDOSCOPY;  Service: Endoscopy;  Laterality: N/A;   RIGHT/LEFT HEART CATH AND CORONARY ANGIOGRAPHY Bilateral 05/06/2021   Procedure: RIGHT/LEFT HEART CATH AND CORONARY ANGIOGRAPHY;  Surgeon: Iran Ouch, MD;  Location: [facility] INVASIVE CV LAB;  Service: Cardiovascular;  Laterality: Bilateral;   ruptured tubal pregnancy  1975   1 ovary removed    Social History Social History   Tobacco Use   Smoking status: Former    Current packs/day: 0.00    Average packs/day: 1 pack/day for 30.0 years (30.0 ttl pk-yrs)    Types: Cigarettes    Start date: 10/27/1976    Quit date: 10/27/2006    Years since quitting: 16.7   Smokeless tobacco: Never  Vaping Use   Vaping status: Never Used  Substance Use Topics   Alcohol use: Yes    Alcohol/week: 0.0 standard drinks of alcohol    Comment: rare   Drug use: No    Family History Family History  Problem Relation Age of Onset   Heart disease Mother    Hypertension Mother    GER disease Mother    Osteoporosis Mother    Clotting disorder Mother    Heart  disease Father    Crohn's disease Sister    Breast cancer Maternal Aunt    Breast cancer Cousin    Diabetes Paternal Uncle    Colon cancer Neg Hx     Allergies  Allergen Reactions   Codeine Other (See Comments)    Makes pt feel Crazy; Anxious    Metoprolol Other (See Comments)   Metoprolol Succinate     REACTION: sob, fatigue   Other Other (See Comments)     REVIEW OF SYSTEMS (Negative unless checked)  Constitutional: [] Weight loss  [] Fever  [] Chills Cardiac: [] Chest pain   [] Chest pressure   [] Palpitations   [] Shortness of breath when laying flat   [] Shortness of breath with exertion. Vascular:  [] Pain in legs with walking   [x] Pain in legs with standing  [] History of DVT   [] Phlebitis    [x] Swelling in legs   [] Varicose veins   [] Non-healing ulcers Pulmonary:   [] Uses home oxygen   [] Productive cough   [] Hemoptysis   [] Wheeze  [] COPD   [] Asthma Neurologic:  [] Dizziness   [] Seizures   [] History of stroke   [] History of TIA  [] Aphasia   [] Vissual changes   [] Weakness or numbness in arm   [x] Weakness or numbness in leg Musculoskeletal:   [] Joint swelling   [x] Joint pain   [x] Low back pain Hematologic:  [] Easy bruising  [] Easy bleeding   [] Hypercoagulable state   [] Anemic Gastrointestinal:  [] Diarrhea   [] Vomiting  [x] Gastroesophageal reflux/heartburn   [] Difficulty swallowing. Genitourinary:  [] Chronic kidney disease   [] Difficult urination  [] Frequent urination   [] Blood in urine Skin:  [] Rashes   [] Ulcers  Psychological:  [] History of anxiety   []  History of major depression.  Physical Examination  There were no vitals filed for this visit. There is no height or weight on file to calculate BMI. Gen: WD/WN, NAD Head: [place]/AT, No temporalis wasting.  Ear/Nose/Throat: Hearing grossly intact, nares w/o erythema or drainage, pinna without lesions Eyes: PER, EOMI, sclera nonicteric.  Neck: Supple, no gross masses.  No JVD.  Pulmonary:  Good air movement, no audible wheezing, no use of accessory muscles.  Cardiac: RRR, precordium not hyperdynamic. Vascular:  scattered varicosities present bilaterally.  Mild venous stasis changes to the legs bilaterally.  2+ soft pitting edema, CEAP [id number]  Vessel Right Left  Radial Palpable Palpable  Gastrointestinal: soft, non-distended. No guarding/no peritoneal signs.  Musculoskeletal: M/S 5/5 throughout.  No deformity.  Neurologic: CN 2-12 intact. Pain and light touch intact in extremities.  Symmetrical.  Speech is fluent. Motor exam as listed above. Psychiatric: Judgment intact, Mood & affect appropriate for pt's clinical situation. Dermatologic: Venous rashes no ulcers noted.  No changes consistent with cellulitis. Lymph : No  lichenification or skin changes of chronic lymphedema.  CBC Lab Results  Component Value Date   WBC 8.1 05/31/2021   HGB 13.3 05/31/2021   HCT 40.5 05/31/2021   MCV 101.5 (H) 05/31/2021   PLT 275 05/31/2021    BMET    Component Value Date/Time   NA 140 05/31/2021 1522   NA 146 (H) 04/30/2021 1523   K 4.5 05/31/2021 1522   CL 106 05/31/2021 1522   CO2 27 05/31/2021 1522   GLUCOSE 114 (H) 05/31/2021 1522   BUN 23 05/31/2021 1522   BUN 23 04/30/2021 1523   CREATININE 0.88 05/31/2021 1522   CREATININE 1.00 12/12/2011 1500   CALCIUM 9.2 05/31/2021 1522   GFRNONAA >60 05/31/2021 1522   GFRAA 56 (L) 10/18/2019 1515  CrCl cannot be calculated (Patient's most recent lab result is older than the maximum 21 days allowed.).  COAG No results found for: "INR", "PROTIME"  Radiology No results found.   Assessment/Plan 1. Lymphedema Recommend:  No surgery or intervention at this point in time.  I have reviewed my discussion with the patient regarding venous insufficiency and why it causes symptoms. I have discussed with the patient the chronic skin changes that accompany venous insufficiency and the long term sequela such as ulceration. Patient will contnue wearing graduated compression stockings on a daily basis, as this has provided excellent control of his edema. The patient will put the stockings on first thing in the morning and removing them in the evening. The patient is reminded not to sleep in the stockings.  In addition, behavioral modification including elevation during the day will be initiated. Exercise is strongly encouraged.  Previous duplex ultrasound of the lower extremities shows normal deep system, no significant superficial reflux was identified.  Given the patient's good control and lack of any problems regarding the venous insufficiency and lymphedema a lymph pump in not need at this time.    The patient will follow up with me PRN should anything change.   The patient voices agreement with this plan.  2. Essential hypertension Continue antihypertensive medications as already ordered, these medications have been reviewed and there are no changes at this time.  3. Gastroesophageal reflux disease without esophagitis Continue PPI as already ordered, this medication has been reviewed and there are no changes at this time.  Avoidence of caffeine and alcohol  Moderate elevation of the head of the bed   4. Lumbar degenerative disc disease Continue NSAID medications as already ordered, these medications have been reviewed and there are no changes at this time.  Continued activity and therapy was stressed.  5. Hyperlipidemia, unspecified hyperlipidemia type Continue statin as ordered and reviewed, no changes at this time    Levora Dredge, MD  07/08/2023 3:56 PM

## 2023-07-09 ENCOUNTER — Ambulatory Visit (INDEPENDENT_AMBULATORY_CARE_PROVIDER_SITE_OTHER): Payer: Medicare Other | Admitting: Vascular Surgery

## 2023-07-09 DIAGNOSIS — E785 Hyperlipidemia, unspecified: Secondary | ICD-10-CM

## 2023-07-09 DIAGNOSIS — K219 Gastro-esophageal reflux disease without esophagitis: Secondary | ICD-10-CM | POA: Diagnosis not present

## 2023-07-09 DIAGNOSIS — I890 Lymphedema, not elsewhere classified: Secondary | ICD-10-CM | POA: Diagnosis not present

## 2023-07-09 DIAGNOSIS — M51369 Other intervertebral disc degeneration, lumbar region without mention of lumbar back pain or lower extremity pain: Secondary | ICD-10-CM

## 2023-07-09 DIAGNOSIS — I10 Essential (primary) hypertension: Secondary | ICD-10-CM | POA: Diagnosis not present

## 2023-07-09 DIAGNOSIS — M5136 Other intervertebral disc degeneration, lumbar region: Secondary | ICD-10-CM

## 2023-07-12 ENCOUNTER — Encounter (INDEPENDENT_AMBULATORY_CARE_PROVIDER_SITE_OTHER): Payer: Self-pay | Admitting: Vascular Surgery

## 2023-08-17 ENCOUNTER — Ambulatory Visit: Payer: Medicare Other | Admitting: Medical

## 2024-03-15 ENCOUNTER — Encounter (INDEPENDENT_AMBULATORY_CARE_PROVIDER_SITE_OTHER): Payer: Self-pay

## 2024-08-18 ENCOUNTER — Telehealth: Admitting: Nurse Practitioner

## 2024-08-18 DIAGNOSIS — J40 Bronchitis, not specified as acute or chronic: Secondary | ICD-10-CM

## 2024-08-18 MED ORDER — PREDNISONE 20 MG PO TABS: 20.0000 mg | ORAL_TABLET | Freq: Every day | ORAL | 0 refills | Status: AC

## 2024-08-18 MED ORDER — AZITHROMYCIN 250 MG PO TABS: ORAL_TABLET | ORAL | 0 refills | Status: AC

## 2024-08-18 NOTE — Progress Notes (Signed)
 Virtual Visit Consent   Courtney Roth, you are scheduled for a virtual visit with a [facility] provider today. Just as with appointments in the office, your consent must be obtained to participate. Your consent will be active for this visit and any virtual visit you may have with one of our providers in the next 365 days. If you have a MyChart account, a copy of this consent can be sent to you electronically.  As this is a virtual visit, video technology does not allow for your provider to perform a traditional examination. This may limit your provider's ability to fully assess your condition. If your provider identifies any concerns that need to be evaluated in person or the need to arrange testing (such as labs, EKG, etc.), we will make arrangements to do so. Although advances in technology are sophisticated, we cannot ensure that it will always work on either your end or our end. If the connection with a video visit is poor, the visit may have to be switched to a telephone visit. With either a video or telephone visit, we are not always able to ensure that we have a secure connection.  By engaging in this virtual visit, you consent to the provision of healthcare and authorize for your insurance to be billed (if applicable) for the services provided during this visit. Depending on your insurance coverage, you may receive a charge related to this service.  I need to obtain your verbal consent now. Are you willing to proceed with your visit today? Courtney Roth has provided verbal consent on 08/18/2024 for a virtual visit (video or telephone). Courtney LELON Servant, NP  Date: 08/18/2024 5:09 PM   Virtual Visit via Video Note   I, Courtney Roth, connected with  Courtney Roth  ([id number], 05-02-1946) on 08/18/24 at  5:00 PM EDT by a video-enabled telemedicine application and verified that I am speaking with the correct person using two identifiers.  Location: Patient: Virtual  Visit Location Patient: Home Provider: Virtual Visit Location Provider: Home Office   I discussed the limitations of evaluation and management by telemedicine and the availability of in person appointments. The patient expressed understanding and agreed to proceed.    History of Present Illness: Courtney Roth is a 78 y.o. who identifies as a female who was assigned female at birth, and is being seen today for uri symptoms.  For the past 13 days [name] has  been experiencing symptoms of fever, chills , body aches, diaphoresis, stuffy nose, Persistent Dry cough and wheezing. She did not take a COVID or flu test. She does not smoke or have a history of asthma.  She does have a history of pulmonary fibrosis.  She has been taking NyQuil severe cold and flu with no relief of her symptoms   Problems:  Patient Active Problem List   Diagnosis Date Noted   Lymphedema 07/08/2023   Abnormal stress test    Pulmonary hypertension (HCC)    Internal hemorrhoids    Benign neoplasm of cecum    Benign neoplasm of ascending colon    Chronic pain syndrome 01/11/2018   Pain syndrome, chronic 12/14/2017   DDD (degenerative disc disease), lumbar 10/12/2017   Localized osteoarthritis of hands, bilateral 10/12/2017   Osteoarthritis of both knees 10/12/2017   UC (ulcerative colitis) (HCC) 10/24/2015   History of colonic polyps 05/29/2015   Hearing problem 03/19/2015   Osteoarthritis of right knee 10/03/2014   Right knee pain 10/02/2014  Low back pain 08/09/2013   Lumbar degenerative disc disease 02/08/2013   Arm pain, anterior 12/26/2011   Hyperglycemia 03/31/2011   Numbness in feet 03/31/2011   DIVERTICULITIS OF COLON 07/09/2010   History of pancreatitis 07/09/2010   Low back pain radiating to left leg 07/09/2010   VITAMIN B12 DEFICIENCY 01/08/2010   History of colonic polyps 01/08/2010   EDEMA LEG 11/10/2008   Obesity 01/31/2008   DYSPNEA ON EXERTION 03/12/2007   Hyperlipidemia  03/01/2007   Essential hypertension 03/01/2007   GERD 03/01/2007   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   TOBACCO USE, QUIT 03/01/2007    Allergies:  Allergies  Allergen Reactions   Codeine Other (See Comments)    Makes pt feel Crazy; Anxious    Lisinopril  Other (See Comments)    Pt states cough, something stuck in throat   Metoprolol  Other (See Comments)   Metoprolol  Succinate     REACTION: sob, fatigue   Other Other (See Comments)   Medications:  Current Outpatient Medications:    azithromycin (ZITHROMAX) 250 MG tablet, Take 2 tablets on day 1, then 1 tablet daily on days 2 through 5, Disp: 6 tablet, Rfl: 0   predniSONE (DELTASONE) 20 MG tablet, Take 1 tablet (20 mg total) by mouth daily with breakfast for 5 days., Disp: 5 tablet, Rfl: 0   acetaminophen  (TYLENOL ) 650 MG CR tablet, Take 650 mg by mouth every 8 (eight) hours as needed (Arthritis Pain)., Disp: , Rfl:    aspirin  81 MG tablet, Take 81 mg by mouth daily., Disp: , Rfl:    hydrochlorothiazide  (HYDRODIURIL ) 25 MG tablet, Take 12.5 mg by mouth daily., Disp: , Rfl:    levothyroxine (SYNTHROID) 50 MCG tablet, Take 50 mcg by mouth daily before breakfast., Disp: , Rfl:    losartan  (COZAAR ) 100 MG tablet, Take 100 mg by mouth daily., Disp: , Rfl:    metoprolol  tartrate (LOPRESSOR ) 25 MG tablet, TAKE 1 TABLET BY MOUTH TWICE A DAY, Disp: 180 tablet, Rfl: 2   Multiple Vitamins-Minerals (CENTRUM SILVER ADULT 50+) TABS, Take 1 tablet by mouth daily., Disp: , Rfl:    Pirfenidone (ESBRIET) 267 MG CAPS, Take 801 mg by mouth 3 (three) times daily., Disp: , Rfl:    rosuvastatin  (CRESTOR ) 20 MG tablet, Take 1 tablet (20 mg total) by mouth daily., Disp: 90 tablet, Rfl: 3   rosuvastatin  (CRESTOR ) 20 MG tablet, Take by mouth., Disp: , Rfl:    sulfaSALAzine  (AZULFIDINE ) 500 MG tablet, Take 2 tablets (1,000 mg total) by mouth 2 (two) times daily., Disp: 120 tablet, Rfl: 3  Current Facility-Administered Medications:    sodium chloride  flush  (NS) 0.9 % injection 3 mL, 3 mL, Intravenous, Q12H, Arida, Muhammad A, MD  Observations/Objective: Patient is well-developed, well-nourished in no acute distress.  Resting comfortably at home.  Head is normocephalic, atraumatic.  No labored breathing.  Speech is clear and coherent with logical content.  Patient is alert and oriented at baseline.    Assessment and Plan: 1. Bronchitis (Primary) - azithromycin (ZITHROMAX) 250 MG tablet; Take 2 tablets on day 1, then 1 tablet daily on days 2 through 5  Dispense: 6 tablet; Refill: 0 - predniSONE (DELTASONE) 20 MG tablet; Take 1 tablet (20 mg total) by mouth daily with breakfast for 5 days.  Dispense: 5 tablet; Refill: 0    Follow Up Instructions: I discussed the assessment and treatment plan with the patient. The patient was provided an opportunity to ask questions and all were answered. The patient agreed  with the plan and demonstrated an understanding of the instructions.  A copy of instructions were sent to the patient via MyChart unless otherwise noted below.     The patient was advised to call back or seek an in-person evaluation if the symptoms worsen or if the condition fails to improve as anticipated.    Ritta Hammes W Pearlena Ow, NP

## 2024-08-18 NOTE — Patient Instructions (Signed)
 Courtney Roth, thank you for joining Haze LELON Servant, NP for today's virtual visit.  While this provider is not your primary care provider (PCP), if your PCP is located in our provider database this encounter information will be shared with them immediately following your visit.   A [facility] MyChart account gives you access to today's visit and all your visits, tests, and labs performed at [facility]  click here if you don't have a [facility] MyChart account or go to [email or web address]  Consent: (Patient) Courtney Roth provided verbal consent for this virtual visit at the beginning of the encounter.  Current Medications:  Current Outpatient Medications:    azithromycin (ZITHROMAX) 250 MG tablet, Take 2 tablets on day 1, then 1 tablet daily on days 2 through 5, Disp: 6 tablet, Rfl: 0   predniSONE (DELTASONE) 20 MG tablet, Take 1 tablet (20 mg total) by mouth daily with breakfast for 5 days., Disp: 5 tablet, Rfl: 0   acetaminophen  (TYLENOL ) 650 MG CR tablet, Take 650 mg by mouth every 8 (eight) hours as needed (Arthritis Pain)., Disp: , Rfl:    aspirin  81 MG tablet, Take 81 mg by mouth daily., Disp: , Rfl:    hydrochlorothiazide  (HYDRODIURIL ) 25 MG tablet, Take 12.5 mg by mouth daily., Disp: , Rfl:    levothyroxine (SYNTHROID) 50 MCG tablet, Take 50 mcg by mouth daily before breakfast., Disp: , Rfl:    losartan  (COZAAR ) 100 MG tablet, Take 100 mg by mouth daily., Disp: , Rfl:    metoprolol  tartrate (LOPRESSOR ) 25 MG tablet, TAKE 1 TABLET BY MOUTH TWICE A DAY, Disp: 180 tablet, Rfl: 2   Multiple Vitamins-Minerals (CENTRUM SILVER ADULT 50+) TABS, Take 1 tablet by mouth daily., Disp: , Rfl:    Pirfenidone (ESBRIET) 267 MG CAPS, Take 801 mg by mouth 3 (three) times daily., Disp: , Rfl:    rosuvastatin  (CRESTOR ) 20 MG tablet, Take 1 tablet (20 mg total) by mouth daily., Disp: 90 tablet, Rfl: 3   rosuvastatin  (CRESTOR ) 20 MG tablet, Take by mouth., Disp: , Rfl:     sulfaSALAzine  (AZULFIDINE ) 500 MG tablet, Take 2 tablets (1,000 mg total) by mouth 2 (two) times daily., Disp: 120 tablet, Rfl: 3  Current Facility-Administered Medications:    sodium chloride  flush (NS) 0.9 % injection 3 mL, 3 mL, Intravenous, Q12H, Arida, Muhammad A, MD   Medications ordered in this encounter:  Meds ordered this encounter  Medications   azithromycin (ZITHROMAX) 250 MG tablet    Sig: Take 2 tablets on day 1, then 1 tablet daily on days 2 through 5    Dispense:  6 tablet    Refill:  0    Supervising Provider:   LAMPTEY, PHILIP O [[id number]]   predniSONE (DELTASONE) 20 MG tablet    Sig: Take 1 tablet (20 mg total) by mouth daily with breakfast for 5 days.    Dispense:  5 tablet    Refill:  0    Supervising Provider:   BLAISE ALEENE KIDD [[id number]]     *If you need refills on other medications prior to your next appointment, please contact your pharmacy*  Follow-Up: Call back or seek an in-person evaluation if the symptoms worsen or if the condition fails to improve as anticipated.   Virtual Care [phone]  Other Instructions INSTRUCTIONS: use a humidifier for nasal congestion Drink plenty of fluids, rest and wash hands frequently to avoid the spread of infection Alternate tylenol  and Motrin for relief of fever  If you have been instructed to have an in-person evaluation today at a local Urgent Care facility, please use the link below. It will take you to a list of all of our available [facility], including address, phone number and hours of operation. Please do not delay care.  [facility]  If you or a family member do not have a primary care provider, use the link below to schedule a visit and establish care. When you choose a [facility] primary care physician or advanced practice provider, you gain a long-term partner in health. Find a Primary Care Provider  Learn more about [facility] in-office and virtual care  options: [facility] - Get Care Now

## 2024-08-26 ENCOUNTER — Ambulatory Visit: Payer: Self-pay | Admitting: Internal Medicine

## 2024-08-26 ENCOUNTER — Other Ambulatory Visit: Payer: Self-pay | Admitting: Internal Medicine

## 2024-08-26 ENCOUNTER — Encounter: Payer: Self-pay | Admitting: Internal Medicine

## 2024-08-26 VITALS — BP 110/74 | HR 76 | Temp 96.7°F | Ht 60.0 in | Wt 258.8 lb

## 2024-08-26 DIAGNOSIS — E559 Vitamin D deficiency, unspecified: Secondary | ICD-10-CM

## 2024-08-26 DIAGNOSIS — K5120 Ulcerative (chronic) proctitis without complications: Secondary | ICD-10-CM

## 2024-08-26 DIAGNOSIS — E782 Mixed hyperlipidemia: Secondary | ICD-10-CM | POA: Diagnosis not present

## 2024-08-26 DIAGNOSIS — Z1211 Encounter for screening for malignant neoplasm of colon: Secondary | ICD-10-CM

## 2024-08-26 DIAGNOSIS — H9190 Unspecified hearing loss, unspecified ear: Secondary | ICD-10-CM | POA: Insufficient documentation

## 2024-08-26 DIAGNOSIS — E039 Hypothyroidism, unspecified: Secondary | ICD-10-CM

## 2024-08-26 DIAGNOSIS — H9193 Unspecified hearing loss, bilateral: Secondary | ICD-10-CM | POA: Diagnosis not present

## 2024-08-26 DIAGNOSIS — J302 Other seasonal allergic rhinitis: Secondary | ICD-10-CM

## 2024-08-26 DIAGNOSIS — J84112 Idiopathic pulmonary fibrosis: Secondary | ICD-10-CM

## 2024-08-26 DIAGNOSIS — R7303 Prediabetes: Secondary | ICD-10-CM

## 2024-08-26 DIAGNOSIS — N1831 Chronic kidney disease, stage 3a: Secondary | ICD-10-CM

## 2024-08-26 DIAGNOSIS — I10 Essential (primary) hypertension: Secondary | ICD-10-CM | POA: Diagnosis not present

## 2024-08-26 DIAGNOSIS — I272 Pulmonary hypertension, unspecified: Secondary | ICD-10-CM

## 2024-08-26 DIAGNOSIS — Z23 Encounter for immunization: Secondary | ICD-10-CM

## 2024-08-26 MED ORDER — HYDROCHLOROTHIAZIDE 12.5 MG PO TABS: 12.5000 mg | ORAL_TABLET | Freq: Every day | ORAL | 3 refills | Status: AC

## 2024-08-26 MED ORDER — LORATADINE 10 MG PO TABS: 10.0000 mg | ORAL_TABLET | Freq: Every day | ORAL | 2 refills | Status: DC

## 2024-08-26 MED ORDER — FLONASE SENSIMIST 27.5 MCG/SPRAY NA SUSP: 2.0000 | Freq: Every day | NASAL | 12 refills | Status: DC

## 2024-08-26 MED ORDER — LEVOTHYROXINE SODIUM 50 MCG PO TABS: 50.0000 ug | ORAL_TABLET | Freq: Every day | ORAL | 3 refills | Status: AC

## 2024-08-26 MED ORDER — LOSARTAN POTASSIUM 100 MG PO TABS: 100.0000 mg | ORAL_TABLET | Freq: Every day | ORAL | 3 refills | Status: AC

## 2024-08-26 MED ORDER — ROSUVASTATIN CALCIUM 20 MG PO TABS: 20.0000 mg | ORAL_TABLET | Freq: Every day | ORAL | 3 refills | Status: AC

## 2024-08-26 MED ORDER — TRIAMCINOLONE ACETONIDE 55 MCG/ACT NA AERO: 1.0000 | INHALATION_SPRAY | Freq: Two times a day (BID) | NASAL | 2 refills | Status: AC

## 2024-08-26 MED ORDER — METOPROLOL TARTRATE 25 MG PO TABS: 25.0000 mg | ORAL_TABLET | Freq: Two times a day (BID) | ORAL | 2 refills | Status: AC

## 2024-08-26 NOTE — Progress Notes (Signed)
 New Patient Office Visit  Subjective   Patient ID: Courtney Roth, female    DOB: 09-17-1946  Age: 78 y.o. MRN: [id number]  CC:  Chief Complaint  Patient presents with   Establish Care    NPE    HPI Courtney Roth presents to establish care Previous Primary Care provider/office: Dr. Jadali  she does not have additional concerns to discuss today.   Patient is here today to establish care with office as her PCP. She has PMH: pulmonary fibrosis, HLD, HTN, hypothyroidism, diverticulosis, GERD, Lumbar DDD, osteoarthritis, lymphedema, HOH, Ulcerative Colitis. She sees pulmonology, Nephrology, Cardiology, Orthopedics, gastroenterology and they manage her chronic illnesses.  She is accompanied by her son Ozell. She has complaints of dry cough after having bronchitis for the last 3 weeks. She reports being given Zpack and prednisone on 08/18/24. She denies fever, chills, sore throat, denies mucous production. Reports allergic rhinitis. Patient has hx pulmonary fibrosis and reports being out of her Perfenidone for quite some time due to no funding through the organization she was getting her medication with for no cost. Advised patient to contact her Pulmonologist about this and determine any medication changes they recommend or if they can help her get her prescription at reduced rate. Patients oxygenation today is 95% on room air. Her son reports her having portable oxygen at home but she states she only uses it as needed with activity due to activity induced shortness of breath. She also endorses feeling tried and having no energy. Recommended patient to use her oxygen consistently including at night time to help with her reported symptoms of feeling tried and having little energy. Recommended patient to start taking Claritin and Flonase nasal spray daily to alleviate allergy symptoms.  Patient is HOH and reports having been evaluated by ENT a long time ago. She does not have hearing  aides and states she used to listen to music at loud volumes. She has cerumen impaction in right ear canal. Recommend Debrox ear drops 3 times day for 1 week and then reduce to using 2-3 times a week.  Patient is due for colonoscopy as her lat was in 2020 and they recommended 3 year follow up. She states she does not want colonoscopy at this time. She is willing to do cologuard. She declines wanting any more mammograms based off her age. PHQ-9 score today was 1; GAD-7 score was 0.  Patient is due for routine fasting blood work today. Recommended patient to get RSV vaccine from her local pharmacy. She reports being up to date on all her other vaccinations.    Outpatient Encounter Medications as of 08/26/2024  Medication Sig   acetaminophen  (TYLENOL ) 650 MG CR tablet Take 650 mg by mouth every 8 (eight) hours as needed (Arthritis Pain).   aspirin  81 MG tablet Take 81 mg by mouth daily.   loratadine (CLARITIN) 10 MG tablet Take 1 tablet (10 mg total) by mouth daily.   triamcinolone (NASACORT) 55 MCG/ACT AERO nasal inhaler Place 1 spray into the nose 2 (two) times daily.   [DISCONTINUED] fluticasone (FLONASE SENSIMIST) 27.5 MCG/SPRAY nasal spray Place 2 sprays into the nose daily.   [DISCONTINUED] hydrochlorothiazide  (HYDRODIURIL ) 25 MG tablet Take 12.5 mg by mouth daily.   [DISCONTINUED] levothyroxine (SYNTHROID) 50 MCG tablet Take 50 mcg by mouth daily before breakfast.   [DISCONTINUED] losartan  (COZAAR ) 100 MG tablet Take 100 mg by mouth daily.   [DISCONTINUED] metoprolol  tartrate (LOPRESSOR ) 25 MG tablet TAKE 1 TABLET BY MOUTH TWICE  A DAY   [DISCONTINUED] rosuvastatin  (CRESTOR ) 20 MG tablet Take by mouth.   hydrochlorothiazide  (HYDRODIURIL ) 12.5 MG tablet Take 1 tablet (12.5 mg total) by mouth daily.   levothyroxine (SYNTHROID) 50 MCG tablet Take 1 tablet (50 mcg total) by mouth daily before breakfast.   losartan  (COZAAR ) 100 MG tablet Take 1 tablet (100 mg total) by mouth daily.   metoprolol   tartrate (LOPRESSOR ) 25 MG tablet Take 1 tablet (25 mg total) by mouth 2 (two) times daily.   Multiple Vitamins-Minerals (CENTRUM SILVER ADULT 50+) TABS Take 1 tablet by mouth daily.   Pirfenidone (ESBRIET) 267 MG CAPS Take 801 mg by mouth 3 (three) times daily.   rosuvastatin  (CRESTOR ) 20 MG tablet Take 1 tablet (20 mg total) by mouth daily.   [DISCONTINUED] mesalamine  (LIALDA ) 1.2 G EC tablet Take 2 tablets (2.4 g total) by mouth daily.   [DISCONTINUED] rosuvastatin  (CRESTOR ) 20 MG tablet Take 1 tablet (20 mg total) by mouth daily. (Patient not taking: Reported on 08/26/2024)   [DISCONTINUED] sulfaSALAzine  (AZULFIDINE ) 500 MG tablet Take 2 tablets (1,000 mg total) by mouth 2 (two) times daily. (Patient not taking: Reported on 08/26/2024)   [DISCONTINUED] sodium chloride  flush (NS) 0.9 % injection 3 mL    No facility-administered encounter medications on file as of 08/26/2024.    Past Medical History:  Diagnosis Date   Colon polyps 03/04/2007   History of Hyperplastic   COVID-19 12/2020   HAD COVID FOR 10 WEEKS; stayed at home entire time   Dyspnea    GERD (gastroesophageal reflux disease)    Hyperlipidemia    Hypertension    Obesity    Pneumonia    Stress reaction    Ulcerative colitis     Past Surgical History:  Procedure Laterality Date   COLONOSCOPY WITH PROPOFOL  N/A 11/19/2018   Procedure: COLONOSCOPY WITH PROPOFOL ;  Surgeon: Janalyn Keene NOVAK, MD;  Location: [facility] ENDOSCOPY;  Service: Endoscopy;  Laterality: N/A;   RIGHT/LEFT HEART CATH AND CORONARY ANGIOGRAPHY Bilateral 05/06/2021   Procedure: RIGHT/LEFT HEART CATH AND CORONARY ANGIOGRAPHY;  Surgeon: Darron Deatrice LABOR, MD;  Location: [facility] INVASIVE CV LAB;  Service: Cardiovascular;  Laterality: Bilateral;   ruptured tubal pregnancy  1975   1 ovary removed    Family History  Problem Relation Age of Onset   Heart disease Mother    Hypertension Mother    GER disease Mother    Osteoporosis Mother    Clotting disorder  Mother    Heart disease Father    Crohn's disease Sister    Breast cancer Maternal Aunt    Breast cancer Cousin    Diabetes Paternal Uncle    Colon cancer Neg Hx     Social History   Socioeconomic History   Marital status: Significant Other    Spouse name: Jeralyn   Number of children: 1   Years of education: Not on file   Highest education level: Not on file  Occupational History   Not on file  Tobacco Use   Smoking status: Former    Current packs/day: 0.00    Average packs/day: 1 pack/day for 30.0 years (30.0 ttl pk-yrs)    Types: Cigarettes    Start date: 10/27/1976    Quit date: 10/27/2006    Years since quitting: 17.8   Smokeless tobacco: Never  Vaping Use   Vaping status: Never Used  Substance and Sexual Activity   Alcohol use: Yes    Alcohol/week: 0.0 standard drinks of alcohol    Comment: rare  Drug use: No   Sexual activity: Not on file  Other Topics Concern   Not on file  Social History Narrative   Lives at home with Jeralyn [facility]) : has 1 son not living with pt.    Social Drivers of Corporate Investment Banker Strain: Not on file  Food Insecurity: Not on file  Transportation Needs: Not on file  Physical Activity: Not on file  Stress: Not on file  Social Connections: Not on file  Intimate Partner Violence: Not on file    Review of Systems  Constitutional:  Positive for malaise/fatigue. Negative for chills and fever.  HENT:  Positive for hearing loss. Negative for congestion and sore throat.   Eyes: Negative.  Negative for blurred vision and pain.  Respiratory:  Positive for cough (dry) and shortness of breath.   Cardiovascular: Negative.  Negative for chest pain, palpitations and leg swelling.  Gastrointestinal: Negative.  Negative for abdominal pain, blood in stool, constipation, diarrhea, heartburn, melena, nausea and vomiting.  Genitourinary: Negative.  Negative for dysuria, flank pain, frequency and urgency.  Musculoskeletal:  Positive for joint pain  (hx osetoarthritis). Negative for myalgias.  Skin: Negative.   Neurological:  Negative for dizziness, tingling, sensory change, weakness and headaches.  Endo/Heme/Allergies: Negative.   Psychiatric/Behavioral: Negative.  Negative for depression and suicidal ideas. The patient is not nervous/anxious.         Objective   BP 110/74   Pulse 76   Temp (!) 96.7 F (35.9 C) (Tympanic)   Ht 5' (1.524 m)   Wt 258 lb 12.8 oz (117.4 kg)   SpO2 95%   BMI 50.54 kg/m   Physical Exam Vitals and nursing note reviewed.  Constitutional:      Appearance: Normal appearance.  HENT:     Head: Normocephalic and atraumatic.     Right Ear: A middle ear effusion is present. There is impacted cerumen.     Left Ear: A middle ear effusion is present.     Nose: Nose normal.     Mouth/Throat:     Mouth: Mucous membranes are moist.     Pharynx: Oropharynx is clear.  Eyes:     Conjunctiva/sclera: Conjunctivae normal.     Pupils: Pupils are equal, round, and reactive to light.  Cardiovascular:     Rate and Rhythm: Normal rate and regular rhythm.     Pulses: Normal pulses.     Heart sounds: Normal heart sounds. No murmur heard. Pulmonary:     Effort: Pulmonary effort is normal.     Breath sounds: Decreased air movement present. Decreased breath sounds present. No wheezing.  Abdominal:     General: Bowel sounds are normal.     Palpations: Abdomen is soft.     Tenderness: There is no abdominal tenderness. There is no right CVA tenderness or left CVA tenderness.  Musculoskeletal:        General: Normal range of motion.     Cervical back: Normal range of motion.     Right lower leg: No edema.     Left lower leg: No edema.  Skin:    General: Skin is warm and dry.  Neurological:     General: No focal deficit present.     Mental Status: She is alert and oriented to person, place, and time.  Psychiatric:        Mood and Affect: Mood normal.        Behavior: Behavior normal.  Assessment &  Plan:  Start Claritin and Flonase nasal spray. Continue medications as prescribed. Refills sent. Recommend FU with Pulmonologist and Cardiologist. Recommend use supplemental oxygen regularly not as needed. Start Debrox ear drops. Referral to ENT. Check routine fasting blood work today and FU with patient on results. Cologuard ordered. Flu shot given. Problem List Items Addressed This Visit     Hyperlipidemia   Relevant Medications   hydrochlorothiazide  (HYDRODIURIL ) 12.5 MG tablet   losartan  (COZAAR ) 100 MG tablet   metoprolol  tartrate (LOPRESSOR ) 25 MG tablet   rosuvastatin  (CRESTOR ) 20 MG tablet   Other Relevant Orders   CBC with Diff   CMP14+EGFR   Lipid Panel w/o Chol/HDL Ratio   Essential hypertension - Primary   Relevant Medications   hydrochlorothiazide  (HYDRODIURIL ) 12.5 MG tablet   losartan  (COZAAR ) 100 MG tablet   metoprolol  tartrate (LOPRESSOR ) 25 MG tablet   rosuvastatin  (CRESTOR ) 20 MG tablet   Other Relevant Orders   CBC with Diff   CMP14+EGFR   Ulcerative proctitis (HCC)   Relevant Orders   CBC with Diff   Hearing problem   Relevant Orders   Ambulatory referral to ENT   Pulmonary hypertension (HCC)   Relevant Medications   hydrochlorothiazide  (HYDRODIURIL ) 12.5 MG tablet   losartan  (COZAAR ) 100 MG tablet   metoprolol  tartrate (LOPRESSOR ) 25 MG tablet   rosuvastatin  (CRESTOR ) 20 MG tablet   Other Relevant Orders   CBC with Diff   Prediabetes   Relevant Orders   Hemoglobin A1c   Acquired hypothyroidism   Relevant Medications   levothyroxine (SYNTHROID) 50 MCG tablet   metoprolol  tartrate (LOPRESSOR ) 25 MG tablet   Colon cancer screening   Relevant Orders   Cologuard   Seasonal allergic rhinitis   Relevant Medications   loratadine (CLARITIN) 10 MG tablet   triamcinolone (NASACORT) 55 MCG/ACT AERO nasal inhaler   Vitamin D deficiency   Relevant Orders   Vitamin D (25 hydroxy)   Needs flu shot   Relevant Orders   Flu Vaccine Trivalent High Dose  (Fluad) (Completed)   Idiopathic pulmonary fibrosis (HCC)   Obesity, morbid, BMI 50 or higher (HCC)   CKD stage 3a, GFR 45-59 ml/min (HCC)    Return in about 3 months (around 11/26/2024).   Total time spent: 45 minutes. This time includes review of previous notes and results and patient face to face interaction during today's visit.    FERNAND FREDY RAMAN, MD  08/26/2024   This document may have been prepared by [facility] Voice Recognition software and as such may include unintentional dictation errors.

## 2024-08-27 LAB — LIPID PANEL W/O CHOL/HDL RATIO
Cholesterol, Total: 177 mg/dL (ref 100–199)
HDL: 35 mg/dL — ABNORMAL LOW (ref >39)
LDL Chol Calc (NIH): 91 mg/dL (ref 0–99)
Triglycerides: 306 mg/dL — ABNORMAL HIGH (ref 0–149)
VLDL Cholesterol Cal: 51 mg/dL — ABNORMAL HIGH (ref 5–40)

## 2024-08-27 LAB — CBC WITH DIFFERENTIAL/PLATELET
Basophils Absolute: 0.1 x10E3/uL (ref 0.0–0.2)
Basos: 1 %
EOS (ABSOLUTE): 0.5 x10E3/uL — ABNORMAL HIGH (ref 0.0–0.4)
Eos: 5 %
Hematocrit: 43.7 % (ref 34.0–46.6)
Hemoglobin: 14 g/dL (ref 11.1–15.9)
Immature Grans (Abs): 0.4 x10E3/uL — ABNORMAL HIGH (ref 0.0–0.1)
Immature Granulocytes: 4 %
Lymphocytes Absolute: 2.7 x10E3/uL (ref 0.7–3.1)
Lymphs: 26 %
MCH: 31.4 pg (ref 26.6–33.0)
MCHC: 32 g/dL (ref 31.5–35.7)
MCV: 98 fL — ABNORMAL HIGH (ref 79–97)
Monocytes Absolute: 1.1 x10E3/uL — ABNORMAL HIGH (ref 0.1–0.9)
Monocytes: 11 %
Neutrophils Absolute: 5.4 x10E3/uL (ref 1.4–7.0)
Neutrophils: 53 %
Platelets: 373 x10E3/uL (ref 150–450)
RBC: 4.46 x10E6/uL (ref 3.77–5.28)
RDW: 12.8 % (ref 11.7–15.4)
WBC: 10.1 x10E3/uL (ref 3.4–10.8)

## 2024-08-27 LAB — CMP14+EGFR
ALT: 16 IU/L (ref 0–32)
AST: 16 IU/L (ref 0–40)
Albumin: 3.9 g/dL (ref 3.8–4.8)
Alkaline Phosphatase: 86 IU/L (ref 49–135)
BUN/Creatinine Ratio: 16 (ref 12–28)
BUN: 26 mg/dL (ref 8–27)
Bilirubin Total: 0.6 mg/dL (ref 0.0–1.2)
CO2: 22 mmol/L (ref 20–29)
Calcium: 9.3 mg/dL (ref 8.7–10.3)
Chloride: 100 mmol/L (ref 96–106)
Creatinine, Ser: 1.58 mg/dL — ABNORMAL HIGH (ref 0.57–1.00)
Globulin, Total: 2.7 g/dL (ref 1.5–4.5)
Glucose: 161 mg/dL — ABNORMAL HIGH (ref 70–99)
Potassium: 4.8 mmol/L (ref 3.5–5.2)
Sodium: 140 mmol/L (ref 134–144)
Total Protein: 6.6 g/dL (ref 6.0–8.5)
eGFR: 33 mL/min/1.73 — ABNORMAL LOW (ref >59)

## 2024-08-27 LAB — VITAMIN D 25 HYDROXY (VIT D DEFICIENCY, FRACTURES): Vit D, 25-Hydroxy: 43 ng/mL (ref 30.0–100.0)

## 2024-08-27 LAB — HEMOGLOBIN A1C
Est. average glucose Bld gHb Est-mCnc: 146 mg/dL
Hgb A1c MFr Bld: 6.7 % — ABNORMAL HIGH (ref 4.8–5.6)

## 2024-08-29 ENCOUNTER — Ambulatory Visit: Payer: Self-pay | Admitting: Internal Medicine

## 2024-08-29 DIAGNOSIS — Z1211 Encounter for screening for malignant neoplasm of colon: Secondary | ICD-10-CM

## 2024-08-29 DIAGNOSIS — E039 Hypothyroidism, unspecified: Secondary | ICD-10-CM

## 2024-09-19 ENCOUNTER — Other Ambulatory Visit: Payer: Self-pay | Admitting: Internal Medicine

## 2024-09-19 ENCOUNTER — Other Ambulatory Visit: Payer: Self-pay

## 2024-09-19 ENCOUNTER — Telehealth: Payer: Self-pay | Admitting: Internal Medicine

## 2024-09-19 NOTE — Telephone Encounter (Signed)
Rx request sent.

## 2024-09-19 NOTE — Telephone Encounter (Signed)
 Patient left VM stating she needs a refill but did not state for which medication.

## 2024-09-21 LAB — TSH+T4F+T3FREE
Free T4: 1.51 ng/dL (ref 0.82–1.77)
T3, Free: 2.5 pg/mL (ref 2.0–4.4)
TSH: 2.71 u[IU]/mL (ref 0.450–4.500)

## 2024-09-21 LAB — SPECIMEN STATUS REPORT

## 2024-10-26 LAB — COLOGUARD

## 2024-10-31 NOTE — Addendum Note (Signed)
 Addended by: FERNAND FREDY RAMAN on: 10/31/2024 11:18 AM   Modules accepted: Orders

## 2024-11-19 LAB — COLOGUARD

## 2024-11-23 ENCOUNTER — Other Ambulatory Visit: Payer: Self-pay | Admitting: Internal Medicine

## 2024-11-23 DIAGNOSIS — J302 Other seasonal allergic rhinitis: Secondary | ICD-10-CM

## 2024-11-25 ENCOUNTER — Other Ambulatory Visit: Payer: Self-pay

## 2024-11-25 MED ORDER — SULFASALAZINE 500 MG PO TABS: 1000.0000 mg | ORAL_TABLET | Freq: Two times a day (BID) | ORAL | 3 refills | Status: AC

## 2024-11-25 NOTE — Progress Notes (Signed)
 Patient notified

## 2025-01-02 ENCOUNTER — Ambulatory Visit: Admitting: Internal Medicine
